# Patient Record
Sex: Female | Born: 1935 | Race: White | Hispanic: No | Smoking: Former smoker
Health system: Southern US, Community
[De-identification: ages and names within clinical notes are randomized; demographics above are authoritative.]

## PROBLEM LIST (undated history)

## (undated) DIAGNOSIS — D497 Neoplasm of unspecified behavior of endocrine glands and other parts of nervous system: Secondary | ICD-10-CM

## (undated) DIAGNOSIS — D126 Benign neoplasm of colon, unspecified: Secondary | ICD-10-CM

## (undated) DIAGNOSIS — Z923 Personal history of irradiation: Secondary | ICD-10-CM

## (undated) DIAGNOSIS — L853 Xerosis cutis: Secondary | ICD-10-CM

## (undated) DIAGNOSIS — Z973 Presence of spectacles and contact lenses: Secondary | ICD-10-CM

## (undated) DIAGNOSIS — C7970 Secondary malignant neoplasm of unspecified adrenal gland: Secondary | ICD-10-CM

## (undated) DIAGNOSIS — T7840XA Allergy, unspecified, initial encounter: Secondary | ICD-10-CM

## (undated) DIAGNOSIS — Z8719 Personal history of other diseases of the digestive system: Secondary | ICD-10-CM

## (undated) DIAGNOSIS — K5900 Constipation, unspecified: Secondary | ICD-10-CM

## (undated) DIAGNOSIS — R350 Frequency of micturition: Secondary | ICD-10-CM

## (undated) DIAGNOSIS — M1990 Unspecified osteoarthritis, unspecified site: Secondary | ICD-10-CM

## (undated) DIAGNOSIS — K219 Gastro-esophageal reflux disease without esophagitis: Secondary | ICD-10-CM

## (undated) DIAGNOSIS — D132 Benign neoplasm of duodenum: Secondary | ICD-10-CM

## (undated) DIAGNOSIS — K228 Other specified diseases of esophagus: Secondary | ICD-10-CM

## (undated) DIAGNOSIS — K2281 Esophageal polyp: Secondary | ICD-10-CM

## (undated) DIAGNOSIS — H9190 Unspecified hearing loss, unspecified ear: Secondary | ICD-10-CM

## (undated) HISTORY — PX: EYE SURGERY: SHX253

## (undated) HISTORY — PX: COLONOSCOPY: SHX174

## (undated) HISTORY — DX: Benign neoplasm of duodenum: D13.2

## (undated) HISTORY — PX: ESOPHAGOGASTRODUODENOSCOPY: SHX1529

## (undated) HISTORY — PX: TUBAL LIGATION: SHX77

## (undated) HISTORY — DX: Neoplasm of unspecified behavior of endocrine glands and other parts of nervous system: D49.7

## (undated) HISTORY — PX: ADRENAL GLAND SURGERY: SHX544

## (undated) HISTORY — DX: Benign neoplasm of colon, unspecified: D12.6

## (undated) HISTORY — DX: Allergy, unspecified, initial encounter: T78.40XA

## (undated) HISTORY — PX: CATARACT EXTRACTION, BILATERAL: SHX1313

---

## 1998-09-05 ENCOUNTER — Other Ambulatory Visit: Admission: RE | Admit: 1998-09-05 | Discharge: 1998-09-05 | Payer: Self-pay | Admitting: Family Medicine

## 1998-09-11 ENCOUNTER — Ambulatory Visit (HOSPITAL_COMMUNITY): Admission: RE | Admit: 1998-09-11 | Discharge: 1998-09-11 | Payer: Self-pay | Admitting: Family Medicine

## 1999-09-06 ENCOUNTER — Other Ambulatory Visit: Admission: RE | Admit: 1999-09-06 | Discharge: 1999-09-06 | Payer: Self-pay | Admitting: Family Medicine

## 2002-12-17 ENCOUNTER — Other Ambulatory Visit: Admission: RE | Admit: 2002-12-17 | Discharge: 2002-12-17 | Payer: Self-pay | Admitting: Gastroenterology

## 2003-12-29 ENCOUNTER — Other Ambulatory Visit: Admission: RE | Admit: 2003-12-29 | Discharge: 2003-12-29 | Payer: Self-pay | Admitting: Family Medicine

## 2006-01-27 ENCOUNTER — Emergency Department (HOSPITAL_COMMUNITY): Admission: EM | Admit: 2006-01-27 | Discharge: 2006-01-28 | Payer: Self-pay | Admitting: Emergency Medicine

## 2008-09-19 ENCOUNTER — Encounter (INDEPENDENT_AMBULATORY_CARE_PROVIDER_SITE_OTHER): Payer: Self-pay | Admitting: *Deleted

## 2008-09-23 ENCOUNTER — Encounter (INDEPENDENT_AMBULATORY_CARE_PROVIDER_SITE_OTHER): Payer: Self-pay | Admitting: *Deleted

## 2008-09-28 ENCOUNTER — Ambulatory Visit: Payer: Self-pay | Admitting: Internal Medicine

## 2008-10-04 ENCOUNTER — Ambulatory Visit: Payer: Self-pay | Admitting: Internal Medicine

## 2008-10-04 ENCOUNTER — Encounter: Payer: Self-pay | Admitting: Internal Medicine

## 2008-10-07 ENCOUNTER — Encounter: Payer: Self-pay | Admitting: Internal Medicine

## 2009-05-28 ENCOUNTER — Emergency Department (HOSPITAL_COMMUNITY): Admission: EM | Admit: 2009-05-28 | Discharge: 2009-05-28 | Payer: Self-pay | Admitting: Emergency Medicine

## 2009-06-08 ENCOUNTER — Ambulatory Visit (HOSPITAL_COMMUNITY): Admission: RE | Admit: 2009-06-08 | Discharge: 2009-06-08 | Payer: Self-pay | Admitting: Family Medicine

## 2009-06-17 DIAGNOSIS — D497 Neoplasm of unspecified behavior of endocrine glands and other parts of nervous system: Secondary | ICD-10-CM

## 2009-06-17 HISTORY — DX: Neoplasm of unspecified behavior of endocrine glands and other parts of nervous system: D49.7

## 2009-07-06 ENCOUNTER — Ambulatory Visit (HOSPITAL_COMMUNITY): Admission: RE | Admit: 2009-07-06 | Discharge: 2009-07-06 | Payer: Self-pay | Admitting: Family Medicine

## 2009-07-18 ENCOUNTER — Ambulatory Visit: Payer: Self-pay | Admitting: Thoracic Surgery

## 2009-07-20 ENCOUNTER — Ambulatory Visit: Admission: RE | Admit: 2009-07-20 | Discharge: 2009-07-20 | Payer: Self-pay | Admitting: Thoracic Surgery

## 2009-07-26 ENCOUNTER — Ambulatory Visit (HOSPITAL_COMMUNITY): Admission: RE | Admit: 2009-07-26 | Discharge: 2009-07-26 | Payer: Self-pay | Admitting: Thoracic Surgery

## 2009-08-01 ENCOUNTER — Ambulatory Visit: Payer: Self-pay | Admitting: Thoracic Surgery

## 2009-08-15 ENCOUNTER — Encounter: Payer: Self-pay | Admitting: Thoracic Surgery

## 2009-08-15 ENCOUNTER — Ambulatory Visit: Payer: Self-pay | Admitting: Thoracic Surgery

## 2009-08-15 ENCOUNTER — Inpatient Hospital Stay (HOSPITAL_COMMUNITY): Admission: RE | Admit: 2009-08-15 | Discharge: 2009-08-19 | Payer: Self-pay | Admitting: Thoracic Surgery

## 2009-08-15 HISTORY — PX: LUNG CANCER SURGERY: SHX702

## 2009-08-23 ENCOUNTER — Ambulatory Visit: Payer: Self-pay | Admitting: Thoracic Surgery

## 2009-08-23 ENCOUNTER — Encounter: Admission: RE | Admit: 2009-08-23 | Discharge: 2009-08-23 | Payer: Self-pay | Admitting: Thoracic Surgery

## 2009-09-13 ENCOUNTER — Ambulatory Visit: Payer: Self-pay | Admitting: Thoracic Surgery

## 2009-09-13 ENCOUNTER — Encounter: Admission: RE | Admit: 2009-09-13 | Discharge: 2009-09-13 | Payer: Self-pay | Admitting: Thoracic Surgery

## 2009-10-09 ENCOUNTER — Encounter (INDEPENDENT_AMBULATORY_CARE_PROVIDER_SITE_OTHER): Payer: Self-pay | Admitting: Surgery

## 2009-10-09 ENCOUNTER — Inpatient Hospital Stay (HOSPITAL_COMMUNITY): Admission: RE | Admit: 2009-10-09 | Discharge: 2009-10-12 | Payer: Self-pay | Admitting: Surgery

## 2009-10-23 ENCOUNTER — Ambulatory Visit: Payer: Self-pay | Admitting: Oncology

## 2009-10-27 ENCOUNTER — Encounter: Admission: RE | Admit: 2009-10-27 | Discharge: 2009-10-27 | Payer: Self-pay | Admitting: Thoracic Surgery

## 2009-10-27 ENCOUNTER — Ambulatory Visit: Payer: Self-pay | Admitting: Thoracic Surgery

## 2009-11-02 ENCOUNTER — Encounter: Payer: Self-pay | Admitting: Internal Medicine

## 2009-11-14 ENCOUNTER — Encounter: Payer: Self-pay | Admitting: Internal Medicine

## 2009-11-17 ENCOUNTER — Ambulatory Visit: Admission: RE | Admit: 2009-11-17 | Discharge: 2010-02-08 | Payer: Self-pay | Admitting: Radiation Oncology

## 2009-11-20 ENCOUNTER — Encounter: Payer: Self-pay | Admitting: Internal Medicine

## 2009-12-26 ENCOUNTER — Encounter: Admission: RE | Admit: 2009-12-26 | Discharge: 2009-12-26 | Payer: Self-pay | Admitting: Thoracic Surgery

## 2009-12-26 ENCOUNTER — Ambulatory Visit: Payer: Self-pay | Admitting: Thoracic Surgery

## 2010-02-08 ENCOUNTER — Ambulatory Visit: Payer: Self-pay | Admitting: Oncology

## 2010-02-12 ENCOUNTER — Encounter: Payer: Self-pay | Admitting: Internal Medicine

## 2010-04-03 ENCOUNTER — Ambulatory Visit: Payer: Self-pay | Admitting: Thoracic Surgery

## 2010-04-03 ENCOUNTER — Encounter: Admission: RE | Admit: 2010-04-03 | Discharge: 2010-04-03 | Payer: Self-pay | Admitting: Thoracic Surgery

## 2010-07-08 ENCOUNTER — Encounter: Payer: Self-pay | Admitting: Family Medicine

## 2010-07-19 NOTE — Letter (Signed)
Summary: [facility]  [facility]   Imported By: Sherian Rein 03/05/2010 11:51:48  _____________________________________________________________________  External Attachment:    Type:   Image     Comment:   External Document

## 2010-07-19 NOTE — Letter (Signed)
[facility]   Imported By: Sherian Rein 01/12/2010 11:15:33  _____________________________________________________________________  External Attachment:    Type:   Image     Comment:   External Document

## 2010-07-19 NOTE — Letter (Signed)
[facility]   Imported By: Sherian Rein 12/05/2009 09:36:11  _____________________________________________________________________  External Attachment:    Type:   Image     Comment:   External Document

## 2010-07-20 NOTE — Letter (Signed)
[facility]   Imported By: Lester [place] 01/04/2010 09:18:06  _____________________________________________________________________  External Attachment:    Type:   Image     Comment:   External Document

## 2010-08-16 ENCOUNTER — Encounter (HOSPITAL_BASED_OUTPATIENT_CLINIC_OR_DEPARTMENT_OTHER): Payer: Medicare Other | Admitting: Oncology

## 2010-08-16 DIAGNOSIS — C7490 Malignant neoplasm of unspecified part of unspecified adrenal gland: Secondary | ICD-10-CM

## 2010-08-16 DIAGNOSIS — C3410 Malignant neoplasm of upper lobe, unspecified bronchus or lung: Secondary | ICD-10-CM

## 2010-09-04 LAB — CBC
HCT: 30.2 % — ABNORMAL LOW (ref 36.0–46.0)
HCT: 31.6 % — ABNORMAL LOW (ref 36.0–46.0)
Hemoglobin: 10.4 g/dL — ABNORMAL LOW (ref 12.0–15.0)
Hemoglobin: 12.4 g/dL (ref 12.0–15.0)
MCHC: 33.2 g/dL (ref 30.0–36.0)
MCV: 90.9 fL (ref 78.0–100.0)
MCV: 91.2 fL (ref 78.0–100.0)
MCV: 91.3 fL (ref 78.0–100.0)
Platelets: 224 10*3/uL (ref 150–400)
RBC: 4.1 MIL/uL (ref 3.87–5.11)
RDW: 14.6 % (ref 11.5–15.5)
RDW: 14.7 % (ref 11.5–15.5)
WBC: 6.9 10*3/uL (ref 4.0–10.5)

## 2010-09-04 LAB — GLUCOSE, CAPILLARY: Glucose-Capillary: 130 mg/dL — ABNORMAL HIGH (ref 70–99)

## 2010-09-04 LAB — BASIC METABOLIC PANEL
BUN: 11 mg/dL (ref 6–23)
GFR calc non Af Amer: >60 mL/min (ref >60)
Glucose, Bld: 144 mg/dL — ABNORMAL HIGH (ref 70–99)
Potassium: 4.2 mEq/L (ref 3.5–5.1)

## 2010-09-04 LAB — CROSSMATCH

## 2010-09-04 LAB — COMPREHENSIVE METABOLIC PANEL
ALT: 26 U/L (ref 0–35)
AST: 31 U/L (ref 0–37)
CO2: 27 mEq/L (ref 19–32)
Calcium: 9.1 mg/dL (ref 8.4–10.5)
Chloride: 109 mEq/L (ref 96–112)
Creatinine, Ser: 0.61 mg/dL (ref 0.4–1.2)
GFR calc Af Amer: >60 mL/min (ref >60)
GFR calc non Af Amer: >60 mL/min (ref >60)
Glucose, Bld: 120 mg/dL — ABNORMAL HIGH (ref 70–99)
Total Bilirubin: 0.4 mg/dL (ref 0.3–1.2)

## 2010-09-04 LAB — URINALYSIS, ROUTINE W REFLEX MICROSCOPIC
Glucose, UA: NEGATIVE mg/dL
Hgb urine dipstick: NEGATIVE
Ketones, ur: NEGATIVE mg/dL
Protein, ur: NEGATIVE mg/dL
Urobilinogen, UA: 1 mg/dL (ref 0.0–1.0)

## 2010-09-04 LAB — POCT I-STAT 4, (NA,K, GLUC, HGB,HCT)
Glucose, Bld: 119 mg/dL — ABNORMAL HIGH (ref 70–99)
HCT: 28 % — ABNORMAL LOW (ref 36.0–46.0)
Hemoglobin: 9.5 g/dL — ABNORMAL LOW (ref 12.0–15.0)

## 2010-09-04 LAB — DIFFERENTIAL
Basophils Absolute: 0 10*3/uL (ref 0.0–0.1)
Eosinophils Absolute: 0.1 10*3/uL (ref 0.0–0.7)
Eosinophils Relative: 2 % (ref 0–5)
Lymphocytes Relative: 27 % (ref 12–46)
Neutrophils Relative %: 62 % (ref 43–77)

## 2010-09-04 LAB — PROTIME-INR
INR: 0.99 (ref 0.00–1.49)
Prothrombin Time: 13 seconds (ref 11.6–15.2)

## 2010-09-04 LAB — MRSA PCR SCREENING: MRSA by PCR: NEGATIVE

## 2010-09-05 ENCOUNTER — Other Ambulatory Visit: Payer: Self-pay | Admitting: Thoracic Surgery

## 2010-09-05 DIAGNOSIS — E278 Other specified disorders of adrenal gland: Secondary | ICD-10-CM

## 2010-09-05 DIAGNOSIS — C3490 Malignant neoplasm of unspecified part of unspecified bronchus or lung: Secondary | ICD-10-CM

## 2010-09-05 LAB — PROTIME-INR
INR: 0.99 (ref 0.00–1.49)
Prothrombin Time: 13 seconds (ref 11.6–15.2)

## 2010-09-05 LAB — CBC
MCHC: 34 g/dL (ref 30.0–36.0)
RBC: 4.55 MIL/uL (ref 3.87–5.11)
RDW: 15.3 % (ref 11.5–15.5)

## 2010-09-06 LAB — BLOOD GAS, ARTERIAL
Acid-Base Excess: 2 mmol/L (ref 0.0–2.0)
O2 Saturation: 97.3 %
Patient temperature: 98.6
TCO2: 26.7 mmol/L (ref 0–100)

## 2010-09-06 LAB — URINALYSIS, ROUTINE W REFLEX MICROSCOPIC
Ketones, ur: NEGATIVE mg/dL
Nitrite: NEGATIVE
Urobilinogen, UA: 1 mg/dL (ref 0.0–1.0)
pH: 8.5 — ABNORMAL HIGH (ref 5.0–8.0)

## 2010-09-06 LAB — TYPE AND SCREEN

## 2010-09-06 LAB — COMPREHENSIVE METABOLIC PANEL
AST: 22 U/L (ref 0–37)
Albumin: 3.4 g/dL — ABNORMAL LOW (ref 3.5–5.2)
CO2: 23 mEq/L (ref 19–32)
Calcium: 9.4 mg/dL (ref 8.4–10.5)
Creatinine, Ser: 0.52 mg/dL (ref 0.4–1.2)
GFR calc Af Amer: >60 mL/min (ref >60)
GFR calc non Af Amer: >60 mL/min (ref >60)

## 2010-09-06 LAB — CBC
MCHC: 33.8 g/dL (ref 30.0–36.0)
MCV: 92.6 fL (ref 78.0–100.0)
Platelets: 399 10*3/uL (ref 150–400)

## 2010-09-06 LAB — APTT: aPTT: 31 seconds (ref 24–37)

## 2010-09-06 LAB — PROTIME-INR: INR: 1 (ref 0.00–1.49)

## 2010-09-10 LAB — COMPREHENSIVE METABOLIC PANEL
ALT: 27 U/L (ref 0–35)
AST: 32 U/L (ref 0–37)
CO2: 28 mEq/L (ref 19–32)
Calcium: 8.5 mg/dL (ref 8.4–10.5)
GFR calc Af Amer: >60 mL/min (ref >60)
Potassium: 3.7 mEq/L (ref 3.5–5.1)
Sodium: 142 mEq/L (ref 135–145)
Total Protein: 6.6 g/dL (ref 6.0–8.3)

## 2010-09-10 LAB — BASIC METABOLIC PANEL
BUN: 10 mg/dL (ref 6–23)
BUN: 13 mg/dL (ref 6–23)
CO2: 26 mEq/L (ref 19–32)
CO2: 28 mEq/L (ref 19–32)
Chloride: 100 mEq/L (ref 96–112)
Chloride: 105 mEq/L (ref 96–112)
Creatinine, Ser: 0.56 mg/dL (ref 0.4–1.2)
GFR calc Af Amer: >60 mL/min (ref >60)
Glucose, Bld: 119 mg/dL — ABNORMAL HIGH (ref 70–99)
Glucose, Bld: 185 mg/dL — ABNORMAL HIGH (ref 70–99)
Potassium: 3.7 mEq/L (ref 3.5–5.1)

## 2010-09-10 LAB — CBC
HCT: 35.2 % — ABNORMAL LOW (ref 36.0–46.0)
MCHC: 33.7 g/dL (ref 30.0–36.0)
MCHC: 33.7 g/dL (ref 30.0–36.0)
MCHC: 33.8 g/dL (ref 30.0–36.0)
MCV: 93.4 fL (ref 78.0–100.0)
MCV: 93.8 fL (ref 78.0–100.0)
Platelets: 325 10*3/uL (ref 150–400)
RBC: 3.67 MIL/uL — ABNORMAL LOW (ref 3.87–5.11)
RBC: 3.68 MIL/uL — ABNORMAL LOW (ref 3.87–5.11)
RDW: 14.6 % (ref 11.5–15.5)
RDW: 14.6 % (ref 11.5–15.5)
RDW: 15.1 % (ref 11.5–15.5)

## 2010-09-10 LAB — POCT I-STAT 3, ART BLOOD GAS (G3+)
TCO2: 25 mmol/L (ref 0–100)
pH, Arterial: 7.441 — ABNORMAL HIGH (ref 7.350–7.400)

## 2010-09-17 LAB — APTT: aPTT: 28 seconds (ref 24–37)

## 2010-09-17 LAB — PROTIME-INR: INR: 1 (ref 0.00–1.49)

## 2010-09-17 LAB — CBC
HCT: 44.2 % (ref 36.0–46.0)
Platelets: 316 10*3/uL (ref 150–400)
RDW: 14.5 % (ref 11.5–15.5)

## 2010-09-18 LAB — BASIC METABOLIC PANEL
BUN: 10 mg/dL (ref 6–23)
GFR calc non Af Amer: >60 mL/min (ref >60)
Glucose, Bld: 128 mg/dL — ABNORMAL HIGH (ref 70–99)
Potassium: 3.5 mEq/L (ref 3.5–5.1)

## 2010-09-18 LAB — CBC
HCT: 41.1 % (ref 36.0–46.0)
MCHC: 33.5 g/dL (ref 30.0–36.0)
MCV: 92.2 fL (ref 78.0–100.0)
Platelets: 317 10*3/uL (ref 150–400)
RDW: 15.1 % (ref 11.5–15.5)

## 2010-09-18 LAB — DIFFERENTIAL
Basophils Absolute: 0 10*3/uL (ref 0.0–0.1)
Basophils Relative: 0 % (ref 0–1)
Eosinophils Absolute: 0.1 10*3/uL (ref 0.0–0.7)
Eosinophils Relative: 1 % (ref 0–5)
Lymphocytes Relative: 19 % (ref 12–46)

## 2010-09-18 LAB — URINALYSIS, ROUTINE W REFLEX MICROSCOPIC
Bilirubin Urine: NEGATIVE
Ketones, ur: NEGATIVE mg/dL
Nitrite: NEGATIVE
pH: 5.5 (ref 5.0–8.0)

## 2010-09-18 LAB — POCT CARDIAC MARKERS: Troponin i, poc: <0.05 ng/mL (ref 0.00–0.09)

## 2010-10-02 ENCOUNTER — Ambulatory Visit
Admission: RE | Admit: 2010-10-02 | Discharge: 2010-10-02 | Disposition: A | Discharge status: Home / Self Care | Payer: Medicare Other | Source: Ambulatory Visit | Attending: Thoracic Surgery | Admitting: Thoracic Surgery

## 2010-10-02 ENCOUNTER — Other Ambulatory Visit: Payer: Medicare Other

## 2010-10-02 ENCOUNTER — Ambulatory Visit (INDEPENDENT_AMBULATORY_CARE_PROVIDER_SITE_OTHER): Payer: Medicare Other | Admitting: Thoracic Surgery

## 2010-10-02 DIAGNOSIS — E278 Other specified disorders of adrenal gland: Secondary | ICD-10-CM

## 2010-10-02 DIAGNOSIS — C3490 Malignant neoplasm of unspecified part of unspecified bronchus or lung: Secondary | ICD-10-CM

## 2010-10-02 MED ORDER — IOHEXOL 300 MG/ML  SOLN
100.0000 mL | Freq: Once | INTRAMUSCULAR | Status: AC | PRN
  Administered 2010-10-02: 100 mL via INTRAVENOUS

## 2010-10-03 ENCOUNTER — Ambulatory Visit: Payer: Medicare Other | Admitting: Thoracic Surgery

## 2010-10-03 ENCOUNTER — Other Ambulatory Visit: Payer: Medicare Other

## 2010-10-03 NOTE — Assessment & Plan Note (Signed)
OFFICE VISIT  Leslie Petersen, Leslie Petersen DOB:  1934-07-07                                        October 02, 2010 CHART #:  [id number]  The patient returns today.  Her blood pressure is 153/82, pulse 84, respirations 18, sats 98%.  She is doing well.  CT scan of her chest and abdomen, we do not have the final report.  Her primary reports looks like there is no evidence of recurrence.  We will inform her when we get the final report.  I will let her be followed from now on by Dr. Truett Perna.  [name], M.D. Electronically Signed  DPB/MEDQ  D:  10/02/2010  T:  10/03/2010  Job:  [id number]  cc:   Quenton Fetter, M.D. Velora Heckler, MD

## 2010-10-30 NOTE — Assessment & Plan Note (Signed)
OFFICE VISIT   DAILA, ELBERT  DOB:  09/01/1934                                        August 23, 2009  CHART #:  [id number]   HISTORY:  The patient comes in today for 1-week postoperative followup.  She is status post a left upper lobe wedge resection on August 15, 2009.  Her postoperative course was uneventful, and she was able to be  discharged home on August 19, 2009, in good condition.  Post discharge,  she has done very well.  She continues to take the pain medication  sporadically, but overall her discomfort is improving.  She is getting  back to her regular activities and her breathing has been stable.  She  has no specific complaints today.   PHYSICAL EXAMINATION:  Vital Signs:  Blood pressure is 131/73, pulse is  150, respirations 16, O2 sat 96% on room air.  Her left mini thoracotomy  incision and chest tube sites have all healed well.  Her chest tube  sutures are removed.  Heart:  Regular rate and rhythm.  Lungs:  Clear.   The chest x-ray is stable with no pneumothorax.   ASSESSMENT AND PLAN:  The patient is doing very well status post left  upper lobe wedge.  Dr. Edwyna Shell has given her a refill of Percocet at  today's visit.  We will see her back in 3 weeks for followup with a  chest x-ray.   [name], M.D.  Electronically Signed   GC/MEDQ  D:  08/23/2009  T:  08/24/2009  Job:  [id number]   cc:   Sigmund Hazel, M.D.  Velora Heckler, MD

## 2010-10-30 NOTE — Letter (Signed)
July 18, 2009   Sigmund Hazel, MD  [address] [place], [place] [address]   Re:  STACYE, NOORI                  DOB:  02/04/1935   Dear Dr. Hyacinth Meeker:   I appreciate the opportunity of seeing the patient.  This patient,  approximately 15 years, had a left adrenalectomy for some type of  adrenal tumor done by Dr. Jamey Ripa.  She now comes back with a left upper  lobe nodule and on PET scan, this was positive with a standard uptake  value of 9.  She also had a large right adrenal mass that had a standard  uptake value of 7.7.  A needle biopsy was done of this adrenal mass,  which showed a lot of necrosis, but it was consistent with a cortical  tumor that could possibly be a malignant mass because of the cytological  atypia.  She has had no fever, chills, or weight loss.   MEDICATIONS:  Fosamax 70 mg a day, vitamin D, calcium, and eye drops.   ALLERGIES:  She has had no allergies.   FAMILY HISTORY:  Noncontributory.   SOCIAL HISTORY:  She is retired, has 1 child.  Quit smoking in 1990.  Does not drink alcohol on a regular basis.   REVIEW OF SYSTEMS:  VITAL SIGNS:  She is 135 pounds.  She is 5 feet 5  inches.  GENERAL:  Her weight has been stable.  CARDIAC:  No angina or atrial fibrillation.  PULMONARY:  No hemoptysis, asthma, or wheezing.  GI:  No nausea, vomiting, constipation, or diarrhea.  GU:  No kidney disease or dysuria.  VASCULAR:  No claudication, DVT, or TIAs.  NEUROLOGICAL:  No dizziness, headaches, blackouts, or seizures.  MUSCULOSKELETAL:  No arthritis or joint pain.  PSYCHIATRIC:  No depression or nervousness.  EYE/ENT:  No changes in eyesight or hearing.  HEMATOLOGICAL:  No problems with bleeding, clotting disorders, or  anemia.   PHYSICAL EXAMINATION:  General:  She is a well-developed Caucasian  female, in no acute distress.  Head, Eyes, Ears, Nose, and Throat:  Unremarkable.  Neck:  Supple without thyromegaly.  There is no  supraclavicular or  axillary adenopathy.  Chest:  Clear to auscultation  and percussion.  Heart:  Regular sinus rhythm.  No murmurs.  Abdomen:  Soft.  There is no hepatosplenomegaly.  There are surgical scars.  Extremities:  Pulses are 2+.  There is no clubbing or edema.  Neurological:  She is oriented x3.  Sensory and motor intact.  Cranial  nerves intact.   I think this is a very interesting situation.  I planned to get a needle  biopsy of the left lung mass to tell whether this is metastatic disease  or a primary lung cancer.  Then considering on what this shows, we will  make recommendations whether to remove the left lobe first or consider  adrenalectomy followed by the removal of the left lung.  I will discuss  this with Dr. Jamey Ripa regarding the timing of this.  I will also get a  full set of pulmonary function test.  I appreciate the opportunity of  seeing the patient.   Sincerely,   [name], M.D.  Electronically Signed   DPB/MEDQ  D:  07/18/2009  T:  07/19/2009  Job:  [id number]

## 2010-10-30 NOTE — Assessment & Plan Note (Signed)
OFFICE VISIT   Leslie Petersen, Leslie Petersen  DOB:  1935/02/25                                        Oct 27, 2009  CHART #:  [id number]   HISTORY OF PRESENT ILLNESS:  This is a 76 year old Caucasian female who  is status post open right adrenalectomy by Dr. Gerrit Friends on October 09, 2009.  The patient is known to Dr. Edwyna Shell from previous left mini thoracotomy  and left wedge resection of left upper lobe which was positive for  adenocarcinoma.  Surgery was performed on August 15, 2009.  The patient  was last seen in the office on September 13, 2009.  She was recovering well  from her left lung surgery.  She was recently discharged from [facility]  after undergoing her right open adrenalectomy.  She denies any shortness  of breath, chest pain, fever, or chills.   PHYSICAL EXAMINATION:  General:  This is a pleasant 76 year old  Caucasian female who is in no acute distress who is alert, oriented, and  cooperative.  Vital Signs:  BP 130/64, heart rate 118, respiration rate  20, O2 sat 97% on room air.  Cardiovascular:  Slightly tachycardic.  Pulmonary:  Clear to auscultation bilaterally.  No rales, wheeze, or  rhonchi.  Abdomen:  Soft, nontender.  Right adrenalectomy wound is  clean, dry, and continuing to heal.  Left posterior chest wound is well  healed.  No signs of infection.   DIAGNOSTIC TESTS:  Chest x-ray done today showed stable moderate hiatal  hernia, slight compression fracture of the thoracolumbar junction.  No  acute change or metastatic findings seen.   IMPRESSION AND PLAN:  1. Adenocarcinoma of the left upper lobe (status post wedge resection      of left upper lobe).  2. Right adrenal mass with atypia (status post open right      adrenalectomy).  3. The patient is going to return to see Dr. Edwyna Shell with a chest x-ray      in 2 months.  She is to      contact our office in the interim if there are any questions,      problems, or concerns.  She also has a followup  appointment to see      Dr. Gerrit Friends on December 01, 2009.   [name], M.D.  Electronically Signed   DZ/MEDQ  D:  10/27/2009  T:  10/28/2009  Job:  [id number]   cc:   Sigmund Hazel, M.D.  Velora Heckler, MD

## 2010-10-30 NOTE — Letter (Signed)
December 26, 2009   Leslie Heckler, MD  [phone]  [place], [place] [address]   Re:  [name], [name]                  DOB:  1934-08-24   Dear Tawanna Cooler,   I saw the patient back in the office today.  Her chest x-ray looks good  and she is doing well after both surgeries.  Her blood is 157/84, pulse  91, respirations 18, saturations  97%.  I will plan to see her back  again in 6 months with a chest x-ray.   [name], M.D.  Electronically Signed   DPB/MEDQ  D:  12/26/2009  T:  12/27/2009  Job:  [id number]

## 2010-10-30 NOTE — Letter (Signed)
April 03, 2010   G. Rolm Baptise, MD  [address]  [place], [place] [address]   Re:  Leslie Petersen, HULTS                  DOB:  1934/06/29   Dear [name],   I saw the patient back today.  We did a 41-month CT of the chest and  abdomen and although this is a preliminary report, there is no evidence  of any recurrence of her cancer either in her chest or in her abdomen.  She is doing well overall.  Her blood pressure is 159/89, pulse 98,  respirations 18, sats were 96%.  Plan to see her back again in 6 months  with another CT scan.   [name], M.D.  Electronically Signed   DPB/MEDQ  D:  04/03/2010  T:  04/04/2010  Job:  [id number]   cc:   Velora Heckler, MD

## 2010-10-30 NOTE — Letter (Signed)
September 13, 2009   Sigmund Hazel, MD  [address]  [address] [place], [place] [address]   Re:  Leslie Petersen, [name]                  DOB:  April 05, 1935   Dear Dr. Hyacinth Meeker:   I saw the patient back today after we had done a resection of her left  upper lobe mass.  We did a wedge resection and node resection, and she  is doing well postoperatively.  Her incision is well healed.  She has  had minimal pain.  Her blood pressure was 136/79, pulse 100,  respirations 18, and sats were 97%.  I will plan to send her to Dr. Darnell Level for scheduling her right adrenal surgery.   [name], M.D.  Electronically Signed   DPB/MEDQ  D:  09/13/2009  T:  09/14/2009  Job:  [id number]   cc:   Velora Heckler, MD

## 2010-10-30 NOTE — Letter (Signed)
August 02, 2009   Sigmund Hazel, MD  [address] [place], [place] [address]   Re:  EUSTOLIA, DRENNEN                  DOB:  1934/11/21   Dear Misty Stanley,   I saw the patient back today after a needle biopsy that was positive for  adenocarcinoma, so I feel she has 2 processes, an adenocarcinoma of the  left upper lobe and an adrenocortical tumor in right adrenals.  I  discussed this with Dr. Jamey Ripa and Dr. Gerrit Friends about how to proceed, and  although there is no right or wrong answer I though to proceed with a  VATS approach to remove her left upper lobe lesion with node dissection,  and we will plan to do this on August 15, 2009, at [facility].  We will  get her to see Dr. Gerrit Friends prior that as Dr. Jamey Ripa has referred her to  Dr. Gerrit Friends.  Her blood pressure was 130/78, pulse 100, respirations 18,  sats were 96%.  Pulmonary function tests were satisfactory with an FVC  of 3.32 and an FEV-1 of 2.3 and a diffusion capacity of 50%.  I  appreciate the opportunity of seeing the patient.   Sincerely,   [name], M.D.  Electronically Signed   DPB/MEDQ  D:  08/02/2009  T:  08/03/2009  Job:  [id number]   cc:   Currie Paris, M.D.  Velora Heckler, MD

## 2011-02-12 ENCOUNTER — Ambulatory Visit (HOSPITAL_COMMUNITY)
Admission: RE | Admit: 2011-02-12 | Discharge: 2011-02-12 | Disposition: A | Discharge status: Home / Self Care | Payer: Medicare Other | Source: Ambulatory Visit | Attending: Oncology | Admitting: Oncology

## 2011-02-12 ENCOUNTER — Other Ambulatory Visit: Payer: Self-pay | Admitting: Oncology

## 2011-02-12 ENCOUNTER — Encounter (HOSPITAL_BASED_OUTPATIENT_CLINIC_OR_DEPARTMENT_OTHER): Payer: Medicare Other | Admitting: Oncology

## 2011-02-12 DIAGNOSIS — C3410 Malignant neoplasm of upper lobe, unspecified bronchus or lung: Secondary | ICD-10-CM

## 2011-02-12 DIAGNOSIS — C7490 Malignant neoplasm of unspecified part of unspecified adrenal gland: Secondary | ICD-10-CM

## 2011-02-12 DIAGNOSIS — C3490 Malignant neoplasm of unspecified part of unspecified bronchus or lung: Secondary | ICD-10-CM

## 2011-08-03 IMAGING — CR DG CHEST 1V
1 series · 1 of 1 positions shown · non-contrast
Comparison: 05/28/2009

CLINICAL DATA: Post lung biopsy for left upper lobe nodule

CHEST - 1 VIEW

[view not recorded]
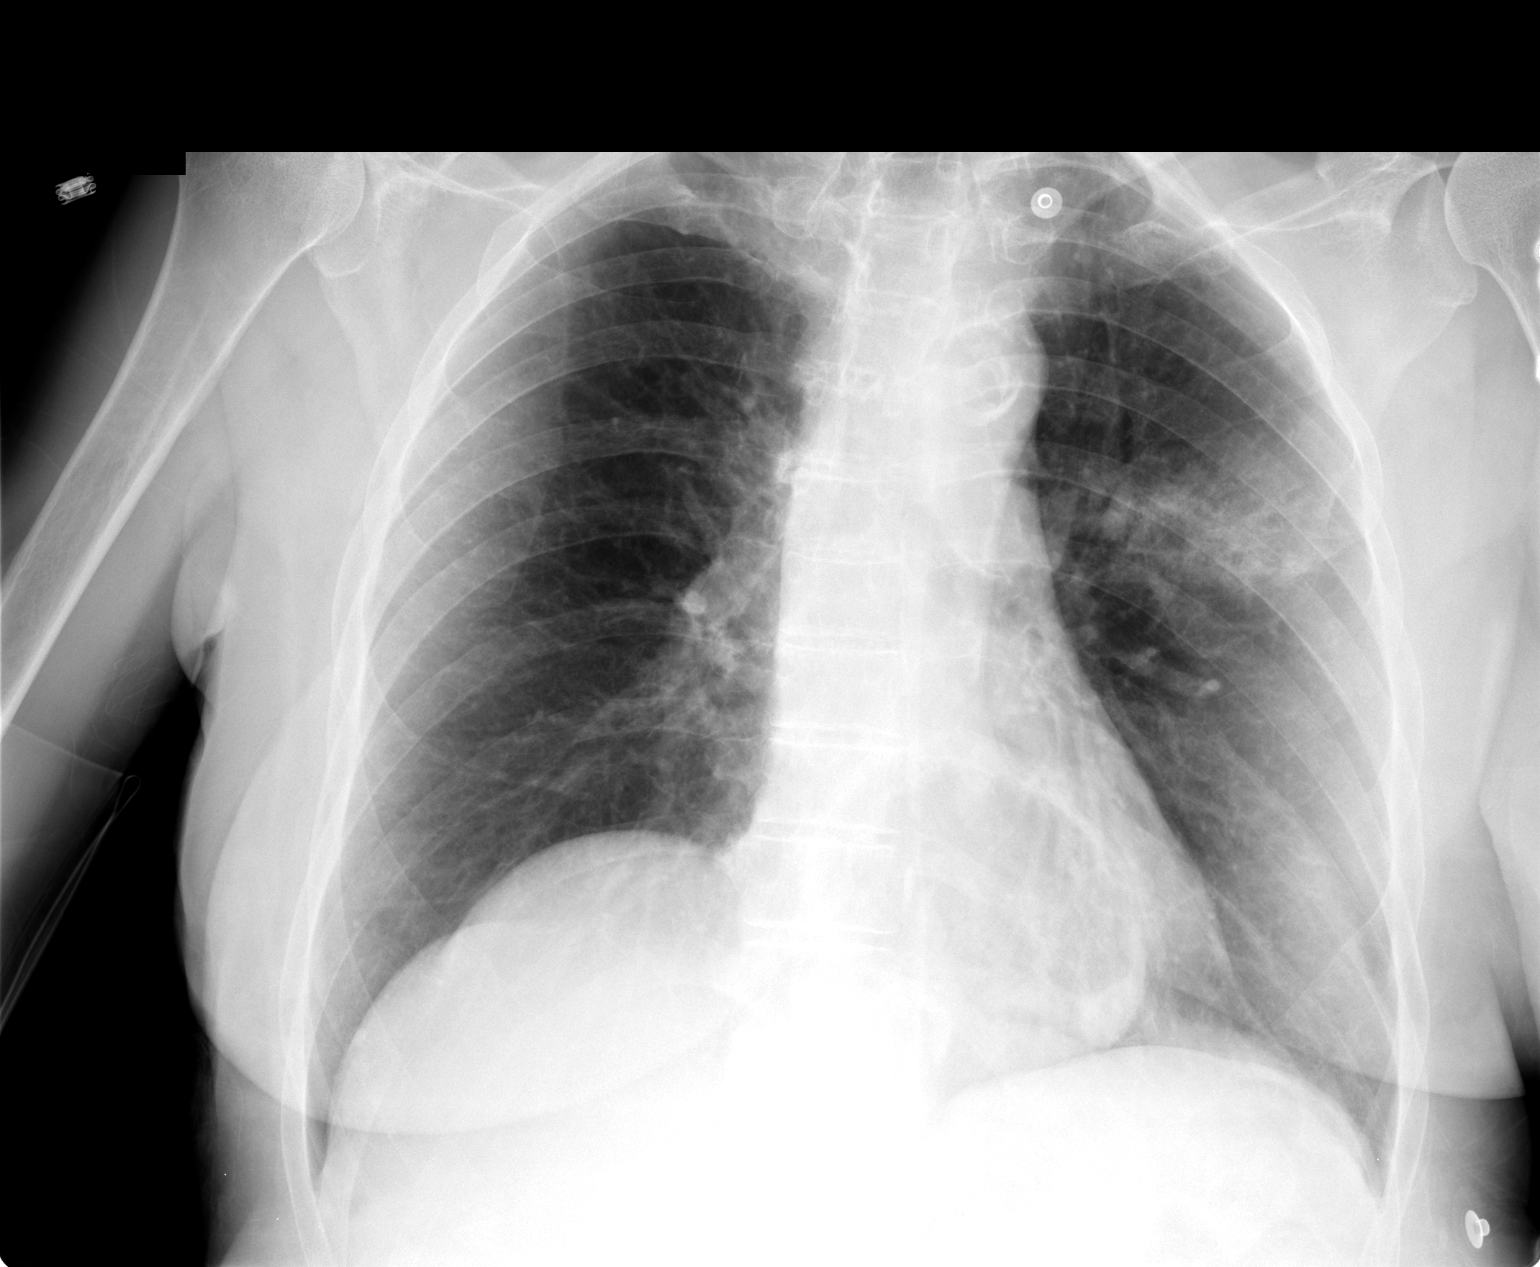

[1 of 1 positions shown; findings below may reference images not displayed]

FINDINGS: There is an airspace infiltrate at the biopsy site in the
left upper lobe, most likely related to post biopsy hemorrhage.  No
definite pneumothorax.  Lungs otherwise clear.  Heart normal.
Large hiatal hernia again noted.
IMPRESSION: There is some focal hemorrhage in the left upper lobe following the
lung biopsy, but no pneumothorax or other acute/significant
findings.

## 2011-09-02 ENCOUNTER — Encounter: Payer: Self-pay | Admitting: Internal Medicine

## 2011-09-05 ENCOUNTER — Encounter: Payer: Self-pay | Admitting: Internal Medicine

## 2011-10-10 ENCOUNTER — Ambulatory Visit (AMBULATORY_SURGERY_CENTER): Payer: Medicare Other | Admitting: *Deleted

## 2011-10-10 ENCOUNTER — Encounter: Payer: Self-pay | Admitting: Internal Medicine

## 2011-10-10 VITALS — Ht 65.5 in | Wt 130.0 lb

## 2011-10-10 DIAGNOSIS — Z1211 Encounter for screening for malignant neoplasm of colon: Secondary | ICD-10-CM

## 2011-10-10 MED ORDER — PEG-KCL-NACL-NASULF-NA ASC-C 100 G PO SOLR: ORAL | Status: DC

## 2011-10-24 ENCOUNTER — Ambulatory Visit (AMBULATORY_SURGERY_CENTER): Payer: Medicare Other | Admitting: Internal Medicine

## 2011-10-24 ENCOUNTER — Encounter: Payer: Self-pay | Admitting: Internal Medicine

## 2011-10-24 VITALS — BP 141/83 | HR 75 | Temp 96.7°F | Resp 20 | Ht 65.0 in | Wt 130.0 lb

## 2011-10-24 DIAGNOSIS — Z1211 Encounter for screening for malignant neoplasm of colon: Secondary | ICD-10-CM

## 2011-10-24 DIAGNOSIS — Z860100 Personal history of colon polyps, unspecified: Secondary | ICD-10-CM | POA: Insufficient documentation

## 2011-10-24 DIAGNOSIS — K5730 Diverticulosis of large intestine without perforation or abscess without bleeding: Secondary | ICD-10-CM

## 2011-10-24 MED ORDER — SODIUM CHLORIDE 0.9 % IV SOLN: 500.0000 mL | INTRAVENOUS | Status: DC

## 2011-10-24 NOTE — Op Note (Signed)
[facility] [address] [place], [place]  [address]  COLONOSCOPY PROCEDURE REPORT  PATIENT:  Leslie, Petersen  MR#:  [id number] BIRTHDATE:  1934/07/20, 76 yrs. old  GENDER:  female ENDOSCOPIST:  Iva Boop, MD, [facility]  PROCEDURE DATE:  10/24/2011 PROCEDURE:  Colonoscopy 29562 ASA CLASS:  Class III INDICATIONS:  surveillance and high-risk screening, history of pre-cancerous (adenomatous) colon polyps 2002 - diminutive adenoma(s) 2005 7 mm adenoma 2010 diminutive TV adenoma MEDICATIONS:   These medications were titrated to patient response per physician's verbal order, Fentanyl 25 mcg IV, Versed 3 mg IV  DESCRIPTION OF PROCEDURE:   After the risks benefits and alternatives of the procedure were thoroughly explained, informed consent was obtained.  Digital rectal exam was performed and revealed no abnormalities.   The LB CF-[id number] endoscope was introduced through the anus and advanced to the cecum, which was identified by both the appendix and ileocecal valve, without limitations.  The quality of the prep was excellent, using MoviPrep.  The instrument was then slowly withdrawn as the colon was fully examined. <<PROCEDUREIMAGES>>  FINDINGS:  Moderate diverticulosis was found throughout the colon. This was otherwise a normal examination of the colon. Includes right colon retroflexion.   Retroflexed views in the rectum revealed no abnormalities.    The time to cecum = 1:59 minutes. The scope was then withdrawn in 7:32 minutes from the cecum and the procedure completed. COMPLICATIONS:  None ENDOSCOPIC IMPRESSION: 1) Moderate diverticulosis throughout the colon 2) Otherwise normal examination 3) Personal history of adenomatous polyps  REPEAT EXAM:  In 5 year(s) for routine screening colonoscopy - if health status at age 64 permits  Iva Boop, MD, [facility]  CC:  The Patient and Sigmund Hazel, MD  n. Rosalie Doctor:   Iva Boop at 10/24/2011 09:00  AM  Simmie Davies, [phone]

## 2011-10-24 NOTE — Patient Instructions (Signed)
YOU HAD AN ENDOSCOPIC PROCEDURE TODAY AT THE [facility]: Refer to the procedure report that was given to you for any specific questions about what was found during the examination.  If the procedure report does not answer your questions, please call your gastroenterologist to clarify.  If you requested that your care partner not be given the details of your procedure findings, then the procedure report has been included in a sealed envelope for you to review at your convenience later.  YOU SHOULD EXPECT: Some feelings of bloating in the abdomen. Passage of more gas than usual.  Walking can help get rid of the air that was put into your GI tract during the procedure and reduce the bloating. If you had a lower endoscopy (such as a colonoscopy or flexible sigmoidoscopy) you may notice spotting of blood in your stool or on the toilet paper. If you underwent a bowel prep for your procedure, then you may not have a normal bowel movement for a few days.  DIET: Your first meal following the procedure should be a light meal and then it is ok to progress to your normal diet.  A half-sandwich or bowl of soup is an example of a good first meal.  Heavy or fried foods are harder to digest and may make you feel nauseous or bloated.  Likewise meals heavy in dairy and vegetables can cause extra gas to form and this can also increase the bloating.  Drink plenty of fluids but you should avoid alcoholic beverages for 24 hours.  ACTIVITY: Your care partner should take you home directly after the procedure.  You should plan to take it easy, moving slowly for the rest of the day.  You can resume normal activity the day after the procedure however you should NOT DRIVE or use heavy machinery for 24 hours (because of the sedation medicines used during the test).    SYMPTOMS TO REPORT IMMEDIATELY: A gastroenterologist can be reached at any hour.  During normal business hours, 8:30 AM to 5:00 PM Monday through Friday,  call [phone].  After hours and on weekends, please call the GI answering service at [phone] who will take a message and have the physician on call contact you.   Following lower endoscopy (colonoscopy or flexible sigmoidoscopy):  Excessive amounts of blood in the stool  Significant tenderness or worsening of abdominal pains  Swelling of the abdomen that is new, acute  Fever of 100F or higher  Following upper endoscopy (EGD)  Vomiting of blood or coffee ground material  New chest pain or pain under the shoulder blades  Painful or persistently difficult swallowing  New shortness of breath  Fever of 100F or higher  Black, tarry-looking stools  FOLLOW UP: If any biopsies were taken you will be contacted by phone or by letter within the next 1-3 weeks.  Call your gastroenterologist if you have not heard about the biopsies in 3 weeks.  Our staff will call the home number listed on your records the next business day following your procedure to check on you and address any questions or concerns that you may have at that time regarding the information given to you following your procedure. This is a courtesy call and so if there is no answer at the home number and we have not heard from you through the emergency physician on call, we will assume that you have returned to your regular daily activities without incident.  SIGNATURES/CONFIDENTIALITY: You and/or your care   partner have signed paperwork which will be entered into your electronic medical record.  These signatures attest to the fact that that the information above on your After Visit Summary has been reviewed and is understood.  Full responsibility of the confidentiality of this discharge information lies with you and/or your care-partner.   Diverticulosis Diverticulosis is a common condition that develops when small pouches (diverticula) form in the wall of the colon. The risk of diverticulosis increases with age. It happens  more often in people who eat a low-fiber diet. Most individuals with diverticulosis have no symptoms. Those individuals with symptoms usually experience abdominal pain, constipation, or loose stools (diarrhea). HOME CARE INSTRUCTIONS   Increase the amount of fiber in your diet as directed by your caregiver or dietician. This may reduce symptoms of diverticulosis.   Your caregiver may recommend taking a dietary fiber supplement.   Drink at least 6 to 8 glasses of water each day to prevent constipation.   Try not to strain when you have a bowel movement.   Your caregiver may recommend avoiding nuts and seeds to prevent complications, although this is still an uncertain benefit.   Only take over-the-counter or prescription medicines for pain, discomfort, or fever as directed by your caregiver.  FOODS WITH HIGH FIBER CONTENT INCLUDE:  Fruits. Apple, peach, pear, tangerine, raisins, prunes.   Vegetables. Brussels sprouts, asparagus, broccoli, cabbage, carrot, cauliflower, romaine lettuce, spinach, summer squash, tomato, winter squash, zucchini.   Starchy Vegetables. Baked beans, kidney beans, lima beans, split peas, lentils, potatoes (with skin).   Grains. Whole wheat bread, brown rice, bran flake cereal, plain oatmeal, white rice, shredded wheat, bran muffins.  SEEK IMMEDIATE MEDICAL CARE IF:   You develop increasing pain or severe bloating.   You have an oral temperature above 102 F (38.9 C), not controlled by medicine.   You develop vomiting or bowel movements that are bloody or black.  Document Released: 02/29/2004 Document Revised: 05/23/2011 Document Reviewed: 11/01/2009 [facility] Patient Information 2012 [place], [place].

## 2011-10-25 ENCOUNTER — Telehealth: Payer: Self-pay

## 2011-10-25 NOTE — Telephone Encounter (Signed)
  Follow up Call-  Call back number 10/24/2011  Post procedure Call Back phone  # [phone]  Permission to leave phone message Yes     Patient questions:  Do you have a fever, pain , or abdominal swelling? no Pain Score  0 *  Have you tolerated food without any problems? yes  Have you been able to return to your normal activities? yes  Do you have any questions about your discharge instructions: Diet   no Medications  no Follow up visit  no  Do you have questions or concerns about your Care? no  Actions: * If pain score is 4 or above: No action needed, pain <4.

## 2011-12-25 ENCOUNTER — Ambulatory Visit (HOSPITAL_COMMUNITY)
Admission: RE | Admit: 2011-12-25 | Discharge: 2011-12-25 | Disposition: A | Discharge status: Home / Self Care | Payer: Medicare Other | Source: Ambulatory Visit | Attending: Family Medicine | Admitting: Family Medicine

## 2011-12-25 ENCOUNTER — Other Ambulatory Visit: Payer: Self-pay | Admitting: Family Medicine

## 2011-12-25 ENCOUNTER — Ambulatory Visit (HOSPITAL_COMMUNITY): Admission: RE | Admit: 2011-12-25 | Payer: Medicare Other | Source: Ambulatory Visit

## 2011-12-25 DIAGNOSIS — H5702 Anisocoria: Secondary | ICD-10-CM | POA: Insufficient documentation

## 2011-12-25 DIAGNOSIS — Z9889 Other specified postprocedural states: Secondary | ICD-10-CM | POA: Insufficient documentation

## 2011-12-25 DIAGNOSIS — R42 Dizziness and giddiness: Secondary | ICD-10-CM | POA: Insufficient documentation

## 2011-12-25 DIAGNOSIS — Z85858 Personal history of malignant neoplasm of other endocrine glands: Secondary | ICD-10-CM | POA: Insufficient documentation

## 2011-12-25 DIAGNOSIS — R110 Nausea: Secondary | ICD-10-CM | POA: Insufficient documentation

## 2011-12-25 LAB — CREATININE, SERUM
Creatinine, Ser: 0.45 mg/dL — ABNORMAL LOW (ref 0.50–1.10)
GFR calc Af Amer: >90 mL/min (ref >90)

## 2011-12-25 MED ORDER — GADOBENATE DIMEGLUMINE 529 MG/ML IV SOLN
12.0000 mL | Freq: Once | INTRAVENOUS | Status: AC
  Administered 2011-12-25: 12 mL via INTRAVENOUS

## 2011-12-26 ENCOUNTER — Other Ambulatory Visit: Payer: Medicare Other

## 2012-01-10 ENCOUNTER — Telehealth: Payer: Self-pay | Admitting: Oncology

## 2012-01-10 NOTE — Telephone Encounter (Signed)
S/w pt re appt for 9/9.  °

## 2012-02-20 ENCOUNTER — Other Ambulatory Visit: Payer: Self-pay | Admitting: *Deleted

## 2012-02-20 ENCOUNTER — Telehealth: Payer: Self-pay | Admitting: Oncology

## 2012-02-20 NOTE — Telephone Encounter (Signed)
called pt per dh and move appt out for another pt,pt aware and new appt is 9/23   aom

## 2012-02-24 ENCOUNTER — Ambulatory Visit: Payer: Medicare Other | Admitting: Oncology

## 2012-03-09 ENCOUNTER — Ambulatory Visit (HOSPITAL_BASED_OUTPATIENT_CLINIC_OR_DEPARTMENT_OTHER): Payer: Medicare Other | Admitting: Oncology

## 2012-03-09 ENCOUNTER — Telehealth: Payer: Self-pay | Admitting: Oncology

## 2012-03-09 ENCOUNTER — Ambulatory Visit (HOSPITAL_COMMUNITY)
Admission: RE | Admit: 2012-03-09 | Discharge: 2012-03-09 | Disposition: A | Discharge status: Home / Self Care | Payer: Medicare Other | Source: Ambulatory Visit | Attending: Oncology | Admitting: Oncology

## 2012-03-09 VITALS — BP 144/75 | HR 79 | Temp 97.8°F | Resp 18 | Ht 65.0 in | Wt 124.6 lb

## 2012-03-09 DIAGNOSIS — Z85118 Personal history of other malignant neoplasm of bronchus and lung: Secondary | ICD-10-CM | POA: Insufficient documentation

## 2012-03-09 DIAGNOSIS — C7490 Malignant neoplasm of unspecified part of unspecified adrenal gland: Secondary | ICD-10-CM

## 2012-03-09 DIAGNOSIS — C3490 Malignant neoplasm of unspecified part of unspecified bronchus or lung: Secondary | ICD-10-CM

## 2012-03-09 DIAGNOSIS — C3410 Malignant neoplasm of upper lobe, unspecified bronchus or lung: Secondary | ICD-10-CM

## 2012-03-09 NOTE — Progress Notes (Signed)
   [facility]    OFFICE PROGRESS NOTE   INTERVAL HISTORY:   She returns as scheduled. No complaint. Good appetite and energy level. No dyspnea.  Objective:  Vital signs in last 24 hours:  Blood pressure 144/75, pulse 79, temperature 97.8 F (36.6 C), temperature source Oral, resp. rate 18, height 5\' 5"  (1.651 m), weight 124 lb 9.6 oz (56.518 kg).    HEENT: Neck without mass Lymphatics: No cervical, supraclavicular, axillary, or inguinal nodes Resp: Bronchial sounds at the left lower chest, no respiratory distress Cardio: Regular rate and rhythm GI: No hepatosplenomegaly, no mass Vascular: No leg edema    Medications: I have reviewed the patient's current medications.  Assessment/Plan: 1. 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 2. Left upper lung adenocarcinoma (T1 N0), status post a wedge resection, 08/15/2009. 3. Left adrenocortical carcinoma, status post a left adrenalectomy in 1994. 4. CT of the chest and abdomen, 04/03/2010.  No evidence of tumor recurrence or metastatic disease in the chest or abdomen.   Disposition:  She remains in clinical remission from lung cancer and adrenocortical carcinoma. She continues followup with Dr. Sharl Ma for management of hormone replacement. She will be referred for a surveillance chest x-ray today. Ms. Kilburg will return for an office visit in 6 months.   Thornton Papas, MD  03/09/2012  12:06 PM

## 2012-03-09 NOTE — Telephone Encounter (Signed)
appts made and printred for pt and pt sent to xray  aom

## 2012-04-08 ENCOUNTER — Encounter: Payer: Self-pay | Admitting: Dietician

## 2012-04-08 NOTE — Progress Notes (Signed)
Brief Out-patient Oncology Nutrition Note  Reason: Patient Screened Positive For Nutrition Risk For Unintentional Weight Loss and Decreased Appetite.   Leslie Petersen is a 77 year old female patient of Dr. Truett Perna, diagnosed with malignant neoplasm of brochus and lung.  Contacted patient via telephone for positive nutrition risk. She reported she does not know why a dietitian needs to call and reported that she is fine.    RD available for nutrition needs.   Iven Finn, MS, RD, LDN [phone]

## 2012-08-24 ENCOUNTER — Telehealth: Payer: Self-pay | Admitting: Oncology

## 2012-08-24 ENCOUNTER — Ambulatory Visit (HOSPITAL_BASED_OUTPATIENT_CLINIC_OR_DEPARTMENT_OTHER): Payer: Medicare Other | Admitting: Oncology

## 2012-08-24 VITALS — BP 155/82 | HR 78 | Temp 96.8°F | Resp 18 | Ht 65.0 in | Wt 122.0 lb

## 2012-08-24 DIAGNOSIS — R109 Unspecified abdominal pain: Secondary | ICD-10-CM

## 2012-08-24 DIAGNOSIS — C3490 Malignant neoplasm of unspecified part of unspecified bronchus or lung: Secondary | ICD-10-CM

## 2012-08-24 DIAGNOSIS — C7490 Malignant neoplasm of unspecified part of unspecified adrenal gland: Secondary | ICD-10-CM

## 2012-08-24 NOTE — Progress Notes (Signed)
   [facility]    OFFICE PROGRESS NOTE   INTERVAL HISTORY:   She returns as scheduled. Good appetite and energy level. She reports chronic back pain, especially with ambulation. She has noted intermittent discomfort in the right mid and lower abdomen for the past 3 weeks. The pain is present with certain movements. No consistent pain. No other complaint.  Objective:  Vital signs in last 24 hours:  Blood pressure 155/82, pulse 78, temperature 96.8 F (36 C), temperature source Oral, resp. rate 18, height 5\' 5"  (1.651 m), weight 122 lb (55.339 kg).    HEENT: Neck without mass Lymphatics: No cervical, supra-clavicular, axillary, or inguinal node Resp: Lungs clear bilaterally Cardio: Regular rate and rhythm GI: No hepatomegaly, nontender, no mass Vascular: No leg edema   Medications: I have reviewed the patient's current medications.  Assessment/Plan: 1. 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 2. Left upper lung adenocarcinoma (T1 N0), status post a wedge resection, 08/15/2009. 3. Left adrenocortical carcinoma, status post a left adrenalectomy in 1994. 4. CT of the chest and abdomen, 10/02/2010. No evidence of tumor recurrence or metastatic disease in the chest or abdomen. 5. Intermittent right abdominal pain for the past 3 weeks-likely related to a benign musculoskeletal condition  Disposition:  She remains in clinical remission from adrenocortical carcinoma. She continues followup with Dr. Sharl Ma for hormone replacement. Ms. Keeven requests a restaging CT evaluation. This will be scheduled for within the next few weeks. She will followup with Dr. Hyacinth Meeker for the chronic back pain. She will return for an office visit in 6 months.   Thornton Papas, MD  08/24/2012  6:04 PM

## 2012-08-26 ENCOUNTER — Other Ambulatory Visit: Payer: Self-pay | Admitting: Family Medicine

## 2012-08-26 DIAGNOSIS — M549 Dorsalgia, unspecified: Secondary | ICD-10-CM

## 2012-08-31 ENCOUNTER — Other Ambulatory Visit: Payer: Medicare Other

## 2012-08-31 ENCOUNTER — Other Ambulatory Visit: Payer: Medicare Other | Admitting: Lab

## 2012-09-01 ENCOUNTER — Ambulatory Visit (HOSPITAL_COMMUNITY)
Admission: RE | Admit: 2012-09-01 | Discharge: 2012-09-01 | Disposition: A | Discharge status: Home / Self Care | Payer: Medicare Other | Source: Ambulatory Visit | Attending: Oncology | Admitting: Oncology

## 2012-09-01 ENCOUNTER — Other Ambulatory Visit (HOSPITAL_BASED_OUTPATIENT_CLINIC_OR_DEPARTMENT_OTHER): Payer: Medicare Other | Admitting: Lab

## 2012-09-01 DIAGNOSIS — M799 Soft tissue disorder, unspecified: Secondary | ICD-10-CM | POA: Insufficient documentation

## 2012-09-01 DIAGNOSIS — K449 Diaphragmatic hernia without obstruction or gangrene: Secondary | ICD-10-CM | POA: Insufficient documentation

## 2012-09-01 DIAGNOSIS — C3490 Malignant neoplasm of unspecified part of unspecified bronchus or lung: Secondary | ICD-10-CM

## 2012-09-01 DIAGNOSIS — Z9089 Acquired absence of other organs: Secondary | ICD-10-CM | POA: Insufficient documentation

## 2012-09-01 DIAGNOSIS — C7490 Malignant neoplasm of unspecified part of unspecified adrenal gland: Secondary | ICD-10-CM

## 2012-09-01 DIAGNOSIS — K5730 Diverticulosis of large intestine without perforation or abscess without bleeding: Secondary | ICD-10-CM | POA: Insufficient documentation

## 2012-09-01 DIAGNOSIS — R911 Solitary pulmonary nodule: Secondary | ICD-10-CM | POA: Insufficient documentation

## 2012-09-01 DIAGNOSIS — N281 Cyst of kidney, acquired: Secondary | ICD-10-CM | POA: Insufficient documentation

## 2012-09-01 DIAGNOSIS — J438 Other emphysema: Secondary | ICD-10-CM | POA: Insufficient documentation

## 2012-09-01 LAB — BASIC METABOLIC PANEL (CC13)
Potassium: 4 mEq/L (ref 3.5–5.1)
Sodium: 142 mEq/L (ref 136–145)

## 2012-09-01 MED ORDER — IOHEXOL 300 MG/ML  SOLN
100.0000 mL | Freq: Once | INTRAMUSCULAR | Status: AC | PRN
  Administered 2012-09-01: 100 mL via INTRAVENOUS

## 2012-09-04 ENCOUNTER — Telehealth: Payer: Self-pay | Admitting: *Deleted

## 2012-09-04 NOTE — Telephone Encounter (Signed)
Message copied by Wandalee Ferdinand on Fri Sep 04, 2012 11:10 AM ------      Message from: Thornton Papas B      Created: Wed Sep 02, 2012  6:38 PM       Please call patient, no evidence of recurrent lung or adrenal cancer, small round soft tissue lesion near the spine is likely benign. Was present in 2011.      Ask radiology to change date of comparison CT on current report.            F/u as scheduled ------

## 2012-09-04 NOTE — Telephone Encounter (Signed)
Called patient with CT results.

## 2012-09-05 ENCOUNTER — Ambulatory Visit
Admission: RE | Admit: 2012-09-05 | Discharge: 2012-09-05 | Disposition: A | Discharge status: Home / Self Care | Payer: Medicare Other | Source: Ambulatory Visit | Attending: Family Medicine | Admitting: Family Medicine

## 2012-09-05 DIAGNOSIS — M549 Dorsalgia, unspecified: Secondary | ICD-10-CM

## 2013-03-02 ENCOUNTER — Ambulatory Visit (HOSPITAL_BASED_OUTPATIENT_CLINIC_OR_DEPARTMENT_OTHER): Payer: Medicare Other | Admitting: Oncology

## 2013-03-02 ENCOUNTER — Telehealth: Payer: Self-pay | Admitting: Oncology

## 2013-03-02 VITALS — BP 155/88 | HR 117 | Temp 98.2°F | Resp 20 | Ht 65.0 in | Wt 116.9 lb

## 2013-03-02 DIAGNOSIS — C7490 Malignant neoplasm of unspecified part of unspecified adrenal gland: Secondary | ICD-10-CM

## 2013-03-02 NOTE — Telephone Encounter (Signed)
, °

## 2013-03-02 NOTE — Progress Notes (Signed)
   [facility]    OFFICE PROGRESS NOTE   INTERVAL HISTORY:   She returns as scheduled. She feels well. Good appetite. No pain. No new complaint.  A restaging CT evaluation on 09/04/2012 revealed no evidence of recurrent lung cancer. No evidence of metastatic disease in the abdomen or pelvis. 16 mm rounded soft tissue density was noted Thomas culture posterior to the L5. The lesion was noted on a PET scan in January of 2011 and was not hypermetabolic.  Objective:  Vital signs in last 24 hours:  Blood pressure 155/88, pulse 117, temperature 98.2 F (36.8 C), temperature source Oral, resp. rate 20, height 5\' 5"  (1.651 m), weight 116 lb 14.4 oz (53.025 kg). repeat manual pulse 100.    HEENT: Neck without mass Lymphatics: No cervical, supra-clavicular, axillary, or inguinal nodes Resp: Lungs clear bilaterally Cardio: Regular rate and rhythm GI: No hepatomegaly, no mass, nontender Vascular: No leg edema Musculoskeletal: No palpable mass overlying the lower lumbar spine, sacrum, or in the para-spinal areas     Medications: I have reviewed the patient's current medications.  Assessment/Plan: 1. 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 2. Left upper lung adenocarcinoma (T1 N0), status post a wedge resection, 08/15/2009. 3. Left adrenocortical carcinoma, status post a left adrenalectomy in 1994. 4. CT of the chest and abdomen, 09/04/2012. No evidence of tumor recurrence or metastatic disease in the chest or abdomen. 5. Soft tissue nodule noted on the CT 09/04/2012, posterior to the left sacrum-present on a PET/CT 07/06/2009 without hypermetabolic activity, likely benign. No lesion palpable on exam today.  Disposition:  Leslie Petersen remains in clinical remission from lung cancer and adrenocortical carcinoma. She has lost weight (proximally 8 pounds) over the past year, but reports a good appetite. She will return for an office  visit in 6 months.   Thornton Papas, MD  03/02/2013  3:51 PM

## 2013-09-04 ENCOUNTER — Emergency Department (HOSPITAL_COMMUNITY)
Admission: EM | Admit: 2013-09-04 | Discharge: 2013-09-04 | Disposition: A | Discharge status: Home / Self Care | Payer: Medicare Other | Attending: Emergency Medicine | Admitting: Emergency Medicine

## 2013-09-04 ENCOUNTER — Encounter (HOSPITAL_COMMUNITY): Payer: Self-pay | Admitting: Emergency Medicine

## 2013-09-04 ENCOUNTER — Emergency Department (HOSPITAL_COMMUNITY): Payer: Medicare Other

## 2013-09-04 DIAGNOSIS — R109 Unspecified abdominal pain: Secondary | ICD-10-CM | POA: Insufficient documentation

## 2013-09-04 DIAGNOSIS — Z85118 Personal history of other malignant neoplasm of bronchus and lung: Secondary | ICD-10-CM | POA: Insufficient documentation

## 2013-09-04 DIAGNOSIS — Z8639 Personal history of other endocrine, nutritional and metabolic disease: Secondary | ICD-10-CM | POA: Insufficient documentation

## 2013-09-04 DIAGNOSIS — N390 Urinary tract infection, site not specified: Secondary | ICD-10-CM

## 2013-09-04 DIAGNOSIS — Z7982 Long term (current) use of aspirin: Secondary | ICD-10-CM | POA: Insufficient documentation

## 2013-09-04 DIAGNOSIS — H409 Unspecified glaucoma: Secondary | ICD-10-CM | POA: Insufficient documentation

## 2013-09-04 DIAGNOSIS — Z862 Personal history of diseases of the blood and blood-forming organs and certain disorders involving the immune mechanism: Secondary | ICD-10-CM | POA: Insufficient documentation

## 2013-09-04 DIAGNOSIS — Z8739 Personal history of other diseases of the musculoskeletal system and connective tissue: Secondary | ICD-10-CM | POA: Insufficient documentation

## 2013-09-04 DIAGNOSIS — Z860100 Personal history of colon polyps, unspecified: Secondary | ICD-10-CM | POA: Insufficient documentation

## 2013-09-04 DIAGNOSIS — IMO0002 Reserved for concepts with insufficient information to code with codable children: Secondary | ICD-10-CM | POA: Insufficient documentation

## 2013-09-04 DIAGNOSIS — Z87891 Personal history of nicotine dependence: Secondary | ICD-10-CM | POA: Insufficient documentation

## 2013-09-04 DIAGNOSIS — R300 Dysuria: Secondary | ICD-10-CM

## 2013-09-04 DIAGNOSIS — Z79899 Other long term (current) drug therapy: Secondary | ICD-10-CM | POA: Insufficient documentation

## 2013-09-04 LAB — CBC WITH DIFFERENTIAL/PLATELET
Basophils Absolute: 0.1 10*3/uL (ref 0.0–0.1)
Basophils Relative: 1 % (ref 0–1)
Eosinophils Absolute: 0.1 10*3/uL (ref 0.0–0.7)
Eosinophils Relative: 1 % (ref 0–5)
HCT: 39.1 % (ref 36.0–46.0)
Hemoglobin: 13 g/dL (ref 12.0–15.0)
Lymphocytes Relative: 21 % (ref 12–46)
Lymphs Abs: 1.3 10*3/uL (ref 0.7–4.0)
MCH: 32.9 pg (ref 26.0–34.0)
MCHC: 33.2 g/dL (ref 30.0–36.0)
MCV: 99 fL (ref 78.0–100.0)
Monocytes Absolute: 0.3 10*3/uL (ref 0.1–1.0)
Monocytes Relative: 6 % (ref 3–12)
Neutro Abs: 4.2 10*3/uL (ref 1.7–7.7)
Neutrophils Relative %: 71 % (ref 43–77)
Platelets: 270 10*3/uL (ref 150–400)
RBC: 3.95 MIL/uL (ref 3.87–5.11)
RDW: 14.2 % (ref 11.5–15.5)
WBC: 5.9 10*3/uL (ref 4.0–10.5)

## 2013-09-04 LAB — URINALYSIS, ROUTINE W REFLEX MICROSCOPIC
Bilirubin Urine: NEGATIVE
Glucose, UA: NEGATIVE mg/dL
Hgb urine dipstick: NEGATIVE
Ketones, ur: NEGATIVE mg/dL
Leukocytes, UA: NEGATIVE
Nitrite: POSITIVE — AB
Protein, ur: NEGATIVE mg/dL
Specific Gravity, Urine: 1.013 (ref 1.005–1.030)
Urobilinogen, UA: 1 mg/dL (ref 0.0–1.0)
pH: 7.5 (ref 5.0–8.0)

## 2013-09-04 LAB — URINE MICROSCOPIC-ADD ON

## 2013-09-04 LAB — BASIC METABOLIC PANEL
BUN: 13 mg/dL (ref 6–23)
CO2: 25 mEq/L (ref 19–32)
Calcium: 9.1 mg/dL (ref 8.4–10.5)
Chloride: 104 mEq/L (ref 96–112)
Creatinine, Ser: 0.56 mg/dL (ref 0.50–1.10)
GFR calc Af Amer: >90 mL/min (ref >90)
GFR calc non Af Amer: 88 mL/min — ABNORMAL LOW (ref >90)
Glucose, Bld: 102 mg/dL — ABNORMAL HIGH (ref 70–99)
Potassium: 4 mEq/L (ref 3.7–5.3)
Sodium: 143 mEq/L (ref 137–147)

## 2013-09-04 MED ORDER — PHENAZOPYRIDINE HCL 200 MG PO TABS: 200.0000 mg | ORAL_TABLET | Freq: Three times a day (TID) | ORAL | Status: DC

## 2013-09-04 MED ORDER — CIPROFLOXACIN HCL 500 MG PO TABS: 500.0000 mg | ORAL_TABLET | Freq: Two times a day (BID) | ORAL | Status: DC

## 2013-09-04 MED ORDER — OXYCODONE-ACETAMINOPHEN 5-325 MG PO TABS: ORAL_TABLET | ORAL | Status: DC

## 2013-09-04 MED ORDER — PHENAZOPYRIDINE HCL 100 MG PO TABS
100.0000 mg | ORAL_TABLET | Freq: Three times a day (TID) | ORAL | Status: DC
  Administered 2013-09-04: 100 mg via ORAL
  Filled 2013-09-04 (×3): qty 1

## 2013-09-04 MED ORDER — OXYCODONE-ACETAMINOPHEN 5-325 MG PO TABS: 2.0000 | ORAL_TABLET | ORAL | Status: DC | PRN

## 2013-09-04 MED ORDER — IOHEXOL 300 MG/ML  SOLN
100.0000 mL | Freq: Once | INTRAMUSCULAR | Status: AC | PRN
  Administered 2013-09-04: 100 mL via INTRAVENOUS

## 2013-09-04 MED ORDER — OXYCODONE-ACETAMINOPHEN 5-325 MG PO TABS
1.0000 | ORAL_TABLET | Freq: Once | ORAL | Status: AC
  Administered 2013-09-04: 1 via ORAL
  Filled 2013-09-04: qty 1

## 2013-09-04 NOTE — Discharge Instructions (Signed)
Take all prescriptions as prescribed. Percocet, pain medication, should be taken for moderate to severe pain. It may cause drowsiness. Do not drive while taking. Be sure to follow up as scheduled with your Primary Care provider, urologist, and oncologist. Let them know you were evaluated today in the ER so all medical records can be obtained.  Return to ER for further evaluation and treatment as needed for NEW or worsening symptoms.

## 2013-09-04 NOTE — ED Notes (Signed)
Pt returned to room from CT

## 2013-09-04 NOTE — ED Notes (Signed)
Pt c/o frequency and dysuria x 6 wks.  Has been seeing a urologist for overactive bladder w/ no relief.  Is scheduled to see him again on Monday but could not wait.

## 2013-09-04 NOTE — ED Notes (Signed)
Pt transported to CT ?

## 2013-09-04 NOTE — ED Notes (Signed)
Pt reports worsening pain in "bladder area" and reports the pain has lasted 6 wks. Reports that she has a high pain tolerance but today has "pushed her over the edge" in terms of pain. Pt. A&Ox4 and in NAD.

## 2013-09-04 NOTE — ED Provider Notes (Signed)
CSN: [id number]     Arrival date & time 09/04/13  1013 History   First MD Initiated Contact with Patient 09/04/13 1042     Chief Complaint  Patient presents with  . Urinary Frequency  . Dysuria     (Consider location/radiation/quality/duration/timing/severity/associated sxs/prior Treatment) HPI Pt is a 79yo female wit hx of lung cancer and adrenal tumors presenting to ED c/o 6 week hx of urinary frequency and dysuria. Pt was initially seen by PCP, no UTI found, referred to urologist who dx her with overactive bladder, Rx-Myrbetriq for which pt has taken for 25 days. Pt states no improvement in symptoms. Reports lower abdominal pain and pressure is so bad she cannot wait for f/u appointment on Monday, 3/23. Pt states she did take OTC Azo just prior to arrival that improved pain from 10/10 to 7/10. Denies fever, n/v/d. Does report darker urine. Denies vaginal symptoms. Pt reports she is concerned for possible cancer due to her previous hx of cancer. Pt unsure if she needs an "MRI, CT, or something." pt reports having oncology f/u on Monday, 3/23 as well.    Past Medical History  Diagnosis Date  . Allergy     seasonal  . Lung cancer, upper lobe, left 2011    adenocarcinoma resected   . Cataract   . Glaucoma   . Osteoporosis   . Adenomatous colon polyp   . 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 Lung cancer surgery  08/2009  . Adrenal gland surgery  1990/2011  . Colonoscopy  multiple   No family history on file. History  Substance Use Topics  . Smoking status: Former Research scientist (life sciences)  . Smokeless tobacco: Never Used  . Alcohol Use: No   OB History   Grav Para Term Preterm Abortions TAB SAB Ect Mult Living                 Review of Systems  Constitutional: Negative for fever, chills, diaphoresis, fatigue and unexpected weight change.  Respiratory: Negative for cough and shortness of breath.   Cardiovascular: Negative for  chest pain and leg swelling.  Gastrointestinal: Positive for abdominal pain ( lower, suprapubic ). Negative for nausea, vomiting, diarrhea and constipation.  Genitourinary: Positive for dysuria, urgency, frequency, hematuria and pelvic pain. Negative for flank pain, decreased urine volume, vaginal bleeding, vaginal discharge and vaginal pain.  Musculoskeletal: Negative for back pain.  All other systems reviewed and are negative.      Allergies  Review of patient's allergies indicates no known allergies.  Home Medications   Current Outpatient Rx  Name  Route  Sig  Dispense  Refill  . alendronate (FOSAMAX) 70 MG tablet   Oral   Take 70 mg by mouth once a week.         Marland Kitchen aspirin 81 MG tablet   Oral   Take 81 mg by mouth daily.         . Cholecalciferol 2000 UNITS TABS   Oral   Take 2,000 Units by mouth daily.         . fish oil-omega-3 fatty acids 1000 MG capsule   Oral   Take 1 g by mouth daily.         . fludrocortisone (FLORINEF) 0.1 MG tablet   Oral   Take 1 tablet by mouth Daily.         . hydrocortisone (CORTEF) 10 MG tablet   Oral  Take 1 tablet by mouth Daily.         Marland Kitchen ibuprofen (ADVIL,MOTRIN) 200 MG tablet   Oral   Take 400 mg by mouth every 6 (six) hours as needed for mild pain or moderate pain.         Marland Kitchen liver oil-zinc oxide (DESITIN) 40 % ointment   Topical   Apply 1 application topically as needed for irritation (apply to vaginal area as needed for irritation).         . mirabegron ER (MYRBETRIQ) 25 MG TB24 tablet   Oral   Take 25 mg by mouth daily.         . Multiple Vitamins-Minerals (MULTIVITAMIN WITH MINERALS) tablet   Oral   Take 1 tablet by mouth daily.         . Phenazopyridine HCl (AZO TABS PO)   Oral   Take 1 tablet by mouth every 6 (six) hours as needed (UTI PAIN).         Marland Kitchen polycarbophil (FIBERCON) 625 MG tablet   Oral   Take 625 mg by mouth daily.          . TRAVATAN Z 0.004 % SOLN ophthalmic solution    Both Eyes   Place 1 drop into both eyes at bedtime.          . ciprofloxacin (CIPRO) 500 MG tablet   Oral   Take 1 tablet (500 mg total) by mouth every 12 (twelve) hours.   10 tablet   0   . oxyCODONE-acetaminophen (PERCOCET/ROXICET) 5-325 MG per tablet      Take 1-2 tabs every 4-6 hours as needed for severe pain.   10 tablet   0   . phenazopyridine (PYRIDIUM) 200 MG tablet   Oral   Take 1 tablet (200 mg total) by mouth 3 (three) times daily.   6 tablet   0    BP 146/72  Pulse 81  Temp(Src) 98.1 F (36.7 C) (Oral)  Resp 17  SpO2 94% Physical Exam  Nursing note and vitals reviewed. Constitutional: She appears well-developed and well-nourished. No distress.  Elderly thin female sitting comfortably in exam bed, NAD.     HENT:  Head: Normocephalic and atraumatic.  Eyes: Conjunctivae are normal. No scleral icterus.  Neck: Normal range of motion.  Cardiovascular: Normal rate, regular rhythm and normal heart sounds.   Pulmonary/Chest: Effort normal and breath sounds normal. No respiratory distress. She has no wheezes. She has no rales. She exhibits no tenderness.  Abdominal: Soft. Bowel sounds are normal. She exhibits no distension and no mass. There is tenderness (suprapubic). There is no rebound and no guarding.  Musculoskeletal: Normal range of motion.  Neurological: She is alert.  Skin: Skin is warm and dry. She is not diaphoretic.    ED Course  Procedures (including critical care time) Labs Review Labs Reviewed  BASIC METABOLIC PANEL - Abnormal; Notable for the following:    Glucose, Bld 102 (*)    GFR calc non Af Amer 88 (*)    All other components within normal limits  URINALYSIS, ROUTINE W REFLEX MICROSCOPIC - Abnormal; Notable for the following:    Color, Urine ORANGE (*)    Nitrite POSITIVE (*)    All other components within normal limits  URINE MICROSCOPIC-ADD ON - Abnormal; Notable for the following:    Bacteria, UA FEW (*)    All other components  within normal limits  URINE CULTURE  CBC WITH DIFFERENTIAL   Imaging Review Ct  Abdomen Pelvis W Contrast  09/04/2013   CLINICAL DATA:  Dysuria and urinary frequency. Adrenal and lung carcinoma.  EXAM: CT ABDOMEN AND PELVIS WITH CONTRAST  TECHNIQUE: Multidetector CT imaging of the abdomen and pelvis was performed using the standard protocol following bolus administration of intravenous contrast.  CONTRAST:  175mL OMNIPAQUE IOHEXOL 300 MG/ML  SOLN  COMPARISON:  09/01/2012  FINDINGS: Surgical clips again seen in both adrenal beds. Soft tissue density in the left adrenal bed measures 1.2 x 1.9 cm and is stable. No retroperitoneal lymphadenopathy identified. Moderate hiatal hernia again demonstrated. Pancreas is normal in appearance. Bilateral renal cysts again noted, however there is no evidence of renal mass or hydronephrosis.  The liver, gallbladder, and spleen are normal in appearance. Enhancing intramuscular soft tissue mass is again seen in the left posterior paraspinal region at the level of L5, which measures 2.6 x 2.0 cm on image 46. This is increased in size from 1.4 x 1.6 cm, and metastasis cannot be excluded. No other abdominal or pelvic wall soft tissue masses are identified.  Uterus and adnexal regions are unremarkable. Diverticulosis again noted, however there is no evidence of diverticulitis. No other inflammatory process or abnormal fluid collections are identified. No suspicious bone lesions identified.  IMPRESSION: Gradual enlargement of 2.6 cm enhancing mass in left paraspinal muscles at level of L5. Metastasis cannot be excluded. Consider PET-CT scan and/or percutaneous needle biopsy for further evaluation.  No other sites of recurrent or metastatic carcinoma identified within the abdomen or pelvis.  Incidental findings including moderate hiatal hernia and diverticulosis.   Electronically Signed   By: Earle Gell M.D.   On: 09/04/2013 15:25     EKG Interpretation None      MDM   Final  diagnoses:  Dysuria  UTI (lower urinary tract infection)    Pt is a 79yo female recently dx by urologist with overactive bladder presenting to ED with worsening urinary symptoms.  Pt has hx of cancer and is concerned it may have moved to her bladder. Denies vaginal symptoms.    UA: positive for nitrites, few bacteria.  Due to continued symptoms will tx for UTI with Cipro x5 days. Urine culture sent.    CBC and BMP: unremarkable.  Discussed pt with Dr. Maryan Rued who also examined pt. Pt has f/u in 2 days with oncologist and urologist, however will get CT abd/pelvis due to pt's concern for mets.    CT abd: significant for gradual enlargement of 2.6cm enhancing mass in left paraspinal muscles at level of L5.  Metastasis cannot be excluded.  Discussed findings with pt.  Recommended pt f/u with oncologist as schedule as further testing may be recommended by oncologist, including PET-CT scan.  Due to location of mass at L5, possible this may be cause of pt's urinary symptoms. Discussed this possibility with pt but advised we will tx for UTI and tx pain today. Rx: cipro, pyridium, and percocet (discontinue azo and myrbetriq at this time). Return precautions provided. Pt verbalized understanding and agreement with tx plan.   Discussed pt with Dr. Maryan Rued who agreed with tx plan.     Noland Fordyce, PA-C 09/04/13 1600

## 2013-09-05 NOTE — ED Provider Notes (Signed)
Medical screening examination/treatment/procedure(s) were conducted as a shared visit with non-physician practitioner(s) and myself.  I personally evaluated the patient during the encounter.   EKG Interpretation None      Pt with c/o of urinary sx of urgency and hesitancy over 6weeks that is not improving.  Pt being treated for overactive bladder.  UA today with questionable infection.  Also hx of adrenal CA and up for new CT.  Done today to r/o mets.  There is an area in the L4 spine which appears bigger from prior.  Findings relayed to pt who is to see her oncologist on Monday.  Blanchie Dessert, MD 09/05/13 2130

## 2013-09-06 ENCOUNTER — Ambulatory Visit (HOSPITAL_BASED_OUTPATIENT_CLINIC_OR_DEPARTMENT_OTHER): Payer: Medicare Other | Admitting: Oncology

## 2013-09-06 ENCOUNTER — Telehealth: Payer: Self-pay | Admitting: Oncology

## 2013-09-06 VITALS — BP 135/72 | HR 96 | Temp 97.0°F | Resp 18 | Ht 65.0 in | Wt 118.0 lb

## 2013-09-06 DIAGNOSIS — C3410 Malignant neoplasm of upper lobe, unspecified bronchus or lung: Secondary | ICD-10-CM

## 2013-09-06 DIAGNOSIS — C7490 Malignant neoplasm of unspecified part of unspecified adrenal gland: Secondary | ICD-10-CM

## 2013-09-06 DIAGNOSIS — R109 Unspecified abdominal pain: Secondary | ICD-10-CM

## 2013-09-06 DIAGNOSIS — C3490 Malignant neoplasm of unspecified part of unspecified bronchus or lung: Secondary | ICD-10-CM

## 2013-09-06 DIAGNOSIS — R300 Dysuria: Secondary | ICD-10-CM

## 2013-09-06 LAB — URINE CULTURE: Colony Count: 40000

## 2013-09-06 MED ORDER — OXYCODONE-ACETAMINOPHEN 5-325 MG PO TABS: ORAL_TABLET | ORAL | Status: DC

## 2013-09-06 NOTE — Progress Notes (Signed)
   [facility]    OFFICE PROGRESS NOTE   INTERVAL HISTORY:   Leslie Petersen returns for followup of lung cancer and adrenocortical carcinoma. She complains of suprapubic pain and urinary urgency for the past 5 weeks. She has been evaluated by Dr. Tresa Moore. She reports undergoing a cystoscopy. She was seen in the emergency room 09/04/2013. She was treated for a urinary tract infection.  A CT of the abdomen and pelvis on 3 21,013 revealed a stable soft tissue density in the left adrenal bed. No retroperitoneal lymphadenopathy. Enhancing intramuscular soft tissue mass at the left posterior paraspinal region L5 level was again noted, now measuring 2.6 x 2 cm. No other abdominal or pelvic soft tissue mass. No suspicious bone lesions. Objective:  Vital signs in last 24 hours:  Blood pressure 135/72, pulse 96, temperature 97 F (36.1 C), temperature source Oral, resp. rate 18, height 5\' 5"  (1.651 m), weight 118 lb (53.524 kg), SpO2 96.00%.    HEENT: Neck without mass Lymphatics: No cervical, supra-clavicular, axillary, or inguinal nodes Resp: Lungs clear bilaterally Cardio: Regular rate and rhythm GI: No hepatomegaly, nontender, no mass Vascular: No leg edema Musculoskeletal: No spine tenderness. No paraspinous mass.      Lab Results:  Lab Results  Component Value Date   WBC 5.9 09/04/2013   HGB 13.0 09/04/2013   HCT 39.1 09/04/2013   MCV 99.0 09/04/2013   PLT 270 09/04/2013   NEUTROABS 4.2 09/04/2013   X-rays: I reviewed the 09/04/2013 CT with Leslie Petersen and Leslie Petersen. The left paraspinous mass appears slightly larger.   Medications: I have reviewed the patient's current medications.  Assessment/Plan: 1. 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 2. Left upper lung adenocarcinoma (T1 N0), status post a wedge resection, 08/15/2009. 3. Left adrenocortical carcinoma, status post a left adrenalectomy in 1994. 4. CT of the chest and  abdomen, 09/04/2012. No evidence of tumor recurrence or metastatic disease in the chest or abdomen. 5. Soft tissue nodule noted on the CT 09/04/2012, posterior to the left sacrum-present on a PET/CT 07/06/2009 without hypermetabolic activity, likely benign. Larger on the pelvic CT 09/04/2013 No lesion palpable on exam today. 6. Enterococcus urinary tract infection 09/04/2013-maintained on ciprofloxacin 7. Urinary urgency/suprapubic pain-partially relieved with Percocet. Etiology unclear  Disposition:  Leslie Petersen has a history of adrenocortical carcinoma and non-small cell lung cancer. The enlarging paraspinous lesion could represent a metastasis or a different malignancy. We will arrange for a biopsy. The etiology of Leslie pain is unclear. I am concerned the pain could be related to a malignant process. We will schedule an MRI of the pelvis.  Leslie Petersen will continue Percocet as needed for pain. She will return for an office visit 09/22/2013.   Betsy Coder, MD  09/06/2013  10:10 AM

## 2013-09-06 NOTE — Telephone Encounter (Signed)
gv adn printd aptp sched and avs for pt for April.Marland KitchenMarland Kitchen

## 2013-09-13 ENCOUNTER — Encounter (HOSPITAL_COMMUNITY): Payer: Self-pay | Admitting: Emergency Medicine

## 2013-09-13 ENCOUNTER — Emergency Department (HOSPITAL_COMMUNITY)
Admission: EM | Admit: 2013-09-13 | Discharge: 2013-09-13 | Disposition: A | Discharge status: Home / Self Care | Payer: Medicare Other | Attending: Emergency Medicine | Admitting: Emergency Medicine

## 2013-09-13 ENCOUNTER — Emergency Department (HOSPITAL_COMMUNITY): Payer: Medicare Other

## 2013-09-13 DIAGNOSIS — Z860100 Personal history of colon polyps, unspecified: Secondary | ICD-10-CM | POA: Insufficient documentation

## 2013-09-13 DIAGNOSIS — K5900 Constipation, unspecified: Secondary | ICD-10-CM | POA: Insufficient documentation

## 2013-09-13 DIAGNOSIS — M810 Age-related osteoporosis without current pathological fracture: Secondary | ICD-10-CM | POA: Insufficient documentation

## 2013-09-13 DIAGNOSIS — Z79899 Other long term (current) drug therapy: Secondary | ICD-10-CM | POA: Insufficient documentation

## 2013-09-13 DIAGNOSIS — IMO0002 Reserved for concepts with insufficient information to code with codable children: Secondary | ICD-10-CM | POA: Insufficient documentation

## 2013-09-13 DIAGNOSIS — R110 Nausea: Secondary | ICD-10-CM | POA: Insufficient documentation

## 2013-09-13 DIAGNOSIS — Z85118 Personal history of other malignant neoplasm of bronchus and lung: Secondary | ICD-10-CM | POA: Insufficient documentation

## 2013-09-13 DIAGNOSIS — Z85858 Personal history of malignant neoplasm of other endocrine glands: Secondary | ICD-10-CM | POA: Insufficient documentation

## 2013-09-13 DIAGNOSIS — R630 Anorexia: Secondary | ICD-10-CM | POA: Insufficient documentation

## 2013-09-13 DIAGNOSIS — H409 Unspecified glaucoma: Secondary | ICD-10-CM | POA: Insufficient documentation

## 2013-09-13 DIAGNOSIS — R3989 Other symptoms and signs involving the genitourinary system: Secondary | ICD-10-CM | POA: Insufficient documentation

## 2013-09-13 DIAGNOSIS — R350 Frequency of micturition: Secondary | ICD-10-CM | POA: Insufficient documentation

## 2013-09-13 DIAGNOSIS — Z87891 Personal history of nicotine dependence: Secondary | ICD-10-CM | POA: Insufficient documentation

## 2013-09-13 DIAGNOSIS — Z7982 Long term (current) use of aspirin: Secondary | ICD-10-CM | POA: Insufficient documentation

## 2013-09-13 LAB — URINALYSIS, ROUTINE W REFLEX MICROSCOPIC
Bilirubin Urine: NEGATIVE
Glucose, UA: NEGATIVE mg/dL
Hgb urine dipstick: NEGATIVE
Ketones, ur: NEGATIVE mg/dL
Leukocytes, UA: NEGATIVE
Nitrite: NEGATIVE
Protein, ur: NEGATIVE mg/dL
Specific Gravity, Urine: 1.015 (ref 1.005–1.030)
Urobilinogen, UA: 0.2 mg/dL (ref 0.0–1.0)
pH: 7.5 (ref 5.0–8.0)

## 2013-09-13 LAB — CBC WITH DIFFERENTIAL/PLATELET
Basophils Absolute: 0.1 10*3/uL (ref 0.0–0.1)
Basophils Relative: 1 % (ref 0–1)
Eosinophils Absolute: 0.2 10*3/uL (ref 0.0–0.7)
Eosinophils Relative: 2 % (ref 0–5)
HCT: 39.8 % (ref 36.0–46.0)
Hemoglobin: 13.3 g/dL (ref 12.0–15.0)
Lymphocytes Relative: 22 % (ref 12–46)
Lymphs Abs: 1.8 10*3/uL (ref 0.7–4.0)
MCH: 33 pg (ref 26.0–34.0)
MCHC: 33.4 g/dL (ref 30.0–36.0)
MCV: 98.8 fL (ref 78.0–100.0)
Monocytes Absolute: 1.1 10*3/uL — ABNORMAL HIGH (ref 0.1–1.0)
Monocytes Relative: 13 % — ABNORMAL HIGH (ref 3–12)
Neutro Abs: 5.1 10*3/uL (ref 1.7–7.7)
Neutrophils Relative %: 63 % (ref 43–77)
Platelets: 220 10*3/uL (ref 150–400)
RBC: 4.03 MIL/uL (ref 3.87–5.11)
RDW: 13.9 % (ref 11.5–15.5)
WBC: 8.1 10*3/uL (ref 4.0–10.5)

## 2013-09-13 LAB — COMPREHENSIVE METABOLIC PANEL
ALT: 58 U/L — ABNORMAL HIGH (ref 0–35)
AST: 97 U/L — ABNORMAL HIGH (ref 0–37)
Albumin: 3.2 g/dL — ABNORMAL LOW (ref 3.5–5.2)
Alkaline Phosphatase: 50 U/L (ref 39–117)
BUN: 16 mg/dL (ref 6–23)
CO2: 27 mEq/L (ref 19–32)
Calcium: 9 mg/dL (ref 8.4–10.5)
Chloride: 100 mEq/L (ref 96–112)
Creatinine, Ser: 0.58 mg/dL (ref 0.50–1.10)
GFR calc Af Amer: >90 mL/min (ref >90)
GFR calc non Af Amer: 87 mL/min — ABNORMAL LOW (ref >90)
Glucose, Bld: 95 mg/dL (ref 70–99)
Potassium: 3.5 mEq/L — ABNORMAL LOW (ref 3.7–5.3)
Sodium: 138 mEq/L (ref 137–147)
Total Bilirubin: 0.8 mg/dL (ref 0.3–1.2)
Total Protein: 6.4 g/dL (ref 6.0–8.3)

## 2013-09-13 LAB — I-STAT CG4 LACTIC ACID, ED: Lactic Acid, Venous: 0.98 mmol/L (ref 0.5–2.2)

## 2013-09-13 MED ORDER — ONDANSETRON HCL 4 MG/2ML IJ SOLN
4.0000 mg | Freq: Once | INTRAMUSCULAR | Status: AC
  Administered 2013-09-13: 4 mg via INTRAVENOUS
  Filled 2013-09-13: qty 2

## 2013-09-13 MED ORDER — HYDROMORPHONE HCL PF 1 MG/ML IJ SOLN
1.0000 mg | Freq: Once | INTRAMUSCULAR | Status: AC
  Administered 2013-09-13: 1 mg via INTRAVENOUS
  Filled 2013-09-13: qty 1

## 2013-09-13 MED ORDER — MORPHINE SULFATE 4 MG/ML IJ SOLN
6.0000 mg | Freq: Once | INTRAMUSCULAR | Status: AC
  Administered 2013-09-13: 6 mg via INTRAVENOUS
  Filled 2013-09-13: qty 2

## 2013-09-13 MED ORDER — OXYCODONE-ACETAMINOPHEN 10-325 MG PO TABS: 1.0000 | ORAL_TABLET | Freq: Four times a day (QID) | ORAL | Status: DC | PRN

## 2013-09-13 NOTE — Discharge Instructions (Signed)
Return here as needed. Follow up with your doctor. Increase your fluid intake. °

## 2013-09-13 NOTE — ED Provider Notes (Signed)
Medical screening examination/treatment/procedure(s) were conducted as a shared visit with non-physician practitioner(s) and myself.  I personally evaluated the patient during the encounter.   EKG Interpretation None      Pt c/o suprapubic pain, ?hx interstitial cystitis.  Also c/o recent constipation. No abd distension. abd soft nt.  Labs.   Mirna Mires, MD 09/13/13 [phone]

## 2013-09-13 NOTE — ED Notes (Signed)
Pt reports that she has been being treated for a UTI for the past 6 weeks and has been continuing to have urinary frequency, urgency and burning. States she has been recently placed on Amoxicillin, and has since been nauseated. Has been taking narcotic pain medication and has become constipated, LBM 3/23. Pt states that this has also caused abdominal pain. Pt states that she is scheduled for an MRI on Thursday for a spot found near her spine that may be malignant, pt has previous hx of CA. Pt a&o x4, ambulatory to triage.

## 2013-09-13 NOTE — ED Provider Notes (Signed)
CSN: [id number]     Arrival date & time 09/13/13  0559 History   First MD Initiated Contact with Patient 09/13/13 [phone]     Chief Complaint  Patient presents with  . Abdominal Pain  . Urinary Tract Infection     (Consider location/radiation/quality/duration/timing/severity/associated sxs/prior Treatment) HPI: Leslie Petersen is a 79 year old woman with past medical history of lung and adrenal cancers who presents to the Emergency Department with ongoing bladder pressure and urinary frequency.  She explains that she has had these symptoms for 7-8 weeks now and has been treated here in the ED for a urinary tract infection, as well as by her urologist for what he believes may be interstitial cystitis.  There is also some concern that a new mass at L5 level discovered by CT here on 3/21 may be a possible cause of her symptoms.  She is scheduled for an MRI by her oncologist to address the mass and the possibility of metastatic disease in her pelvis contributing to her symptoms.  She is also scheduled to follow up with her urologist to undergo a "bladder pressurization" to relieve her IC symptoms.  She is here today because she "just can't take" the pain in her pelvis and would like help with symptom relief.  She describes the pain as "pressure" located in her pelvis and states that it is 10/10 in severity, relieved for about 5 minutes after she urinates.  She has taken percocet with no relief of the pain.  She is also reporting urinary frequency, anorexia, nausea, dry mouth, and constipation (last BM one week ago).  She denies dysuria, fever, and blood in her urine and stool.  She has undergone treatment with Cipro, azo, myrbetriq, and pyridium and is now being treated with amoxicillin (started yesterday), which she feels is adding to her nausea.  She has tried OTC fiber supplements and stool softeners with no relief of her constipation.  Past Medical History  Diagnosis Date  . Allergy     seasonal  . Lung  cancer, upper lobe, left 2011    adenocarcinoma resected   . Cataract   . Glaucoma   . Osteoporosis   . Adenomatous colon polyp   . 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 Lung cancer surgery  08/2009  . Adrenal gland surgery  1990/2011  . Colonoscopy  multiple   History reviewed. No pertinent family history. History  Substance Use Topics  . Smoking status: Former Research scientist (life sciences)  . Smokeless tobacco: Never Used  . Alcohol Use: No   OB History   Grav Para Term Preterm Abortions TAB SAB Ect Mult Living                 Review of Systems    Allergies  Review of patient's allergies indicates no known allergies.  Home Medications   Current Outpatient Rx  Name  Route  Sig  Dispense  Refill  . alendronate (FOSAMAX) 70 MG tablet   Oral   Take 70 mg by mouth once a week.         Marland Kitchen aspirin 81 MG tablet   Oral   Take 81 mg by mouth daily.         . Cholecalciferol 2000 UNITS TABS   Oral   Take 2,000 Units by mouth daily.         . ciprofloxacin (CIPRO) 500 MG tablet   Oral  Take 1 tablet (500 mg total) by mouth every 12 (twelve) hours.   10 tablet   0   . fish oil-omega-3 fatty acids 1000 MG capsule   Oral   Take 1 g by mouth daily.         . fludrocortisone (FLORINEF) 0.1 MG tablet   Oral   Take 1 tablet by mouth Daily.         . hydrocortisone (CORTEF) 10 MG tablet   Oral   Take 1 tablet by mouth Daily.         Marland Kitchen ibuprofen (ADVIL,MOTRIN) 200 MG tablet   Oral   Take 400 mg by mouth every 6 (six) hours as needed for mild pain or moderate pain.         Marland Kitchen liver oil-zinc oxide (DESITIN) 40 % ointment   Topical   Apply 1 application topically as needed for irritation (apply to vaginal area as needed for irritation).         . mirabegron ER (MYRBETRIQ) 25 MG TB24 tablet   Oral   Take 25 mg by mouth daily.         . Multiple Vitamins-Minerals (MULTIVITAMIN WITH MINERALS) tablet    Oral   Take 1 tablet by mouth daily.         Marland Kitchen oxyCODONE-acetaminophen (PERCOCET/ROXICET) 5-325 MG per tablet      Take 1-2 tabs every 4-6 hours as needed for severe pain.   60 tablet   0   . phenazopyridine (PYRIDIUM) 200 MG tablet   Oral   Take 1 tablet (200 mg total) by mouth 3 (three) times daily.   6 tablet   0   . Phenazopyridine HCl (AZO TABS PO)   Oral   Take 1 tablet by mouth every 6 (six) hours as needed (UTI PAIN).         Marland Kitchen polycarbophil (FIBERCON) 625 MG tablet   Oral   Take 625 mg by mouth daily.          . TRAVATAN Z 0.004 % SOLN ophthalmic solution   Both Eyes   Place 1 drop into both eyes at bedtime.           BP 159/70  Pulse 59  Temp(Src) 99 F (37.2 C) (Oral)  Resp 18  SpO2 93% Physical Exam  Nursing note and vitals reviewed. Constitutional: She is oriented to person, place, and time. She appears well-developed and well-nourished. No distress.  HENT:  Head: Normocephalic and atraumatic.  Mouth/Throat: Oropharynx is clear and moist.  Eyes: Pupils are equal, round, and reactive to light.  Neck: Normal range of motion. Neck supple.  Cardiovascular: Normal rate, regular rhythm and normal heart sounds.   Pulmonary/Chest: Effort normal and breath sounds normal.  Abdominal: Soft. Bowel sounds are normal. She exhibits no distension and no mass. There is no tenderness.  Describes increase in pressure to palpation of LLQ, RLQ, and suprapubic area  Neurological: She is alert and oriented to person, place, and time. She exhibits normal muscle tone. Coordination normal.  Skin: Skin is warm and dry.    ED Course  Procedures (including critical care time) Labs Review Labs Reviewed  CBC WITH DIFFERENTIAL - Abnormal; Notable for the following:    Monocytes Relative 13 (*)    Monocytes Absolute 1.1 (*)    All other components within normal limits  COMPREHENSIVE METABOLIC PANEL - Abnormal; Notable for the following:    Potassium 3.5 (*)    Albumin  3.2 (*)  AST 97 (*)    ALT 58 (*)    GFR calc non Af Amer 87 (*)    All other components within normal limits  URINALYSIS, ROUTINE W REFLEX MICROSCOPIC - Abnormal; Notable for the following:    Color, Urine AMBER (*)    All other components within normal limits  URINE CULTURE  I-STAT CG4 LACTIC ACID, ED   Imaging Review Dg Abd Acute W/chest  09/13/2013   CLINICAL DATA:  History of lung cancer. Lower abdominal pain and constipation.  EXAM: ACUTE ABDOMEN SERIES (ABDOMEN 2 VIEW & CHEST 1 VIEW)  COMPARISON:  CT chest, abdomen and pelvis 09/01/2012. CT abdomen and pelvis 09/04/2013.  FINDINGS: Single view of the chest demonstrates changes of emphysema. The lungs are clear. Heart size is normal. No pneumothorax or pleural effusion. Hiatal hernia is noted.  Two views of the abdomen show no free intraperitoneal air. The bowel gas pattern is unremarkable. Multiple surgical clips in the upper abdomen are identified. No abnormal abdominal calcification or focal bony abnormality is seen.  IMPRESSION: No acute finding chest or abdomen.   Electronically Signed   By: Inge Rise M.D.   On: 09/13/2013 11:04      Patient has been stable here in the emergency department.  The patient has a chronic issue with her bladder, that could be related to a cancer that has yet to be diagnosed based on the fact that she has a tumor in the posterior aspect of her lower pelvic region. Return here as needed. Increase her fluid intake. The patient has no UTI. Told to follow up with her Urologist and PCP. The patient has an MRI scheduled for this week.   Brent General, PA-C 09/13/13 1135

## 2013-09-14 LAB — URINE CULTURE
Colony Count: NO GROWTH
Culture: NO GROWTH

## 2013-09-16 ENCOUNTER — Ambulatory Visit (HOSPITAL_COMMUNITY)
Admission: RE | Admit: 2013-09-16 | Discharge: 2013-09-16 | Disposition: A | Discharge status: Home / Self Care | Payer: Medicare Other | Source: Ambulatory Visit | Attending: Oncology | Admitting: Oncology

## 2013-09-16 DIAGNOSIS — M259 Joint disorder, unspecified: Secondary | ICD-10-CM | POA: Insufficient documentation

## 2013-09-16 DIAGNOSIS — C3490 Malignant neoplasm of unspecified part of unspecified bronchus or lung: Secondary | ICD-10-CM | POA: Insufficient documentation

## 2013-09-16 DIAGNOSIS — M899 Disorder of bone, unspecified: Secondary | ICD-10-CM | POA: Insufficient documentation

## 2013-09-16 DIAGNOSIS — C7490 Malignant neoplasm of unspecified part of unspecified adrenal gland: Secondary | ICD-10-CM | POA: Insufficient documentation

## 2013-09-16 DIAGNOSIS — R109 Unspecified abdominal pain: Secondary | ICD-10-CM | POA: Insufficient documentation

## 2013-09-16 DIAGNOSIS — M949 Disorder of cartilage, unspecified: Secondary | ICD-10-CM

## 2013-09-16 DIAGNOSIS — R3915 Urgency of urination: Secondary | ICD-10-CM | POA: Insufficient documentation

## 2013-09-16 MED ORDER — GADOBENATE DIMEGLUMINE 529 MG/ML IV SOLN
9.0000 mL | Freq: Once | INTRAVENOUS | Status: AC | PRN
  Administered 2013-09-16: 9 mL via INTRAVENOUS

## 2013-09-17 ENCOUNTER — Telehealth: Payer: Self-pay | Admitting: *Deleted

## 2013-09-17 NOTE — Telephone Encounter (Signed)
Called and informed patient that MRI showed no explanation for her pain and to follow up as scheduled.  Per Dr. Benay Spice.  Patient verbalized understanding.

## 2013-09-17 NOTE — Telephone Encounter (Signed)
Message copied by Norma Fredrickson on Fri Sep 17, 2013  9:07 AM ------      Message from: Ladell Pier      Created: Fri Sep 17, 2013  8:09 AM       Please call patient, MRI with no explanation for her pain, f/u 4/8 as scheduled ------

## 2013-09-22 ENCOUNTER — Ambulatory Visit (HOSPITAL_BASED_OUTPATIENT_CLINIC_OR_DEPARTMENT_OTHER): Payer: Medicare Other | Admitting: Oncology

## 2013-09-22 ENCOUNTER — Telehealth: Payer: Self-pay | Admitting: Oncology

## 2013-09-22 VITALS — BP 153/64 | HR 92 | Temp 98.0°F | Resp 18 | Ht 65.0 in | Wt 117.5 lb

## 2013-09-22 DIAGNOSIS — C3490 Malignant neoplasm of unspecified part of unspecified bronchus or lung: Secondary | ICD-10-CM

## 2013-09-22 NOTE — Progress Notes (Signed)
  [place] OFFICE PROGRESS NOTE   Diagnosis: Adrenocortical carcinoma, non-small cell lung cancer  INTERVAL HISTORY:   She reports resolution of the pelvic pain. No difficulty with bowel or bladder function at present. No specific complaint.  Objective:    Resp: Bronchial sounds at the left upper chest, slight decrease in breath sounds at the right compared to left chest. No respiratory distress Cardio: Regular rate and rhythm GI: Nontender, no hepatomegaly, no mass Vascular: No leg edema Musculoskeletal: No palpable mass in the left paraspinous area    Lab Results:  Lab Results  Component Value Date   WBC 8.1 09/13/2013   HGB 13.3 09/13/2013   HCT 39.8 09/13/2013   MCV 98.8 09/13/2013   PLT 220 09/13/2013   NEUTROABS 5.1 09/13/2013     Imaging: MRI of the pelvis 09/16/2013-normal appearance of the bony pelvis. Lower left paraspinous soft tissue mass with avid enhancement most consistent with a neoplasm   Medications: I have reviewed the patient's current medications.  Assessment/Plan: 1. 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 2. Left upper lung adenocarcinoma (T1 N0), status post a wedge resection, 08/15/2009. 3. Left adrenocortical carcinoma, status post a left adrenalectomy in 1994. 4. CT of the chest and abdomen, 09/04/2012. No evidence of tumor recurrence or metastatic disease in the chest or abdomen. 5. Soft tissue nodule noted on the CT 09/04/2012, posterior to the left sacrum-present on a PET/CT 07/06/2009 without hypermetabolic activity. Larger on the pelvic CT 09/04/2013, the lesion had associated enhancement on an MRI 09/16/2013 6. Enterococcus urinary tract infection 09/04/2013 7. Urinary urgency/suprapubic pain-resolved   Disposition:  The pelvic pain has resolved. I reviewed the CT and MRI images with Ms. Mcwilliams today. She would like to proceed with a biopsy of the left paraspinous lesion. We will  refer her to interventional radiology. She will return for an office visit in 2 weeks.  Ladell Pier, MD  09/22/2013  8:59 AM

## 2013-09-22 NOTE — Progress Notes (Signed)
Copy of Living Will/Advance Directive forwarded to HIM to be scanned.

## 2013-09-22 NOTE — Telephone Encounter (Signed)
Gave pt appt for MD visit on april 2015

## 2013-09-24 ENCOUNTER — Encounter (HOSPITAL_COMMUNITY): Payer: Self-pay | Admitting: Pharmacy Technician

## 2013-09-27 ENCOUNTER — Other Ambulatory Visit: Payer: Self-pay | Admitting: Radiology

## 2013-09-29 ENCOUNTER — Other Ambulatory Visit: Payer: Self-pay | Admitting: Radiology

## 2013-09-30 ENCOUNTER — Other Ambulatory Visit: Payer: Self-pay | Admitting: Radiology

## 2013-09-30 ENCOUNTER — Ambulatory Visit (HOSPITAL_COMMUNITY): Admission: RE | Admit: 2013-09-30 | Payer: Medicare Other | Source: Ambulatory Visit

## 2013-09-30 ENCOUNTER — Ambulatory Visit (HOSPITAL_COMMUNITY)
Admission: RE | Admit: 2013-09-30 | Discharge: 2013-09-30 | Disposition: A | Discharge status: Home / Self Care | Payer: Medicare Other | Source: Ambulatory Visit | Attending: Oncology | Admitting: Oncology

## 2013-10-01 ENCOUNTER — Other Ambulatory Visit: Payer: Self-pay | Admitting: Oncology

## 2013-10-01 ENCOUNTER — Ambulatory Visit (HOSPITAL_COMMUNITY)
Admission: RE | Admit: 2013-10-01 | Discharge: 2013-10-01 | Disposition: A | Discharge status: Home / Self Care | Payer: Medicare Other | Source: Ambulatory Visit | Attending: Oncology | Admitting: Oncology

## 2013-10-01 ENCOUNTER — Encounter (HOSPITAL_COMMUNITY): Payer: Self-pay

## 2013-10-01 VITALS — BP 132/63 | HR 85 | Resp 18

## 2013-10-01 DIAGNOSIS — C3490 Malignant neoplasm of unspecified part of unspecified bronchus or lung: Secondary | ICD-10-CM

## 2013-10-01 DIAGNOSIS — Z87891 Personal history of nicotine dependence: Secondary | ICD-10-CM | POA: Insufficient documentation

## 2013-10-01 DIAGNOSIS — H409 Unspecified glaucoma: Secondary | ICD-10-CM | POA: Insufficient documentation

## 2013-10-01 DIAGNOSIS — Z860100 Personal history of colon polyps, unspecified: Secondary | ICD-10-CM | POA: Insufficient documentation

## 2013-10-01 DIAGNOSIS — M810 Age-related osteoporosis without current pathological fracture: Secondary | ICD-10-CM | POA: Insufficient documentation

## 2013-10-01 DIAGNOSIS — Z79899 Other long term (current) drug therapy: Secondary | ICD-10-CM | POA: Insufficient documentation

## 2013-10-01 DIAGNOSIS — C786 Secondary malignant neoplasm of retroperitoneum and peritoneum: Secondary | ICD-10-CM | POA: Insufficient documentation

## 2013-10-01 LAB — CBC
HCT: 43.4 % (ref 36.0–46.0)
Hemoglobin: 14.1 g/dL (ref 12.0–15.0)
MCH: 32.8 pg (ref 26.0–34.0)
MCHC: 32.5 g/dL (ref 30.0–36.0)
MCV: 100.9 fL — ABNORMAL HIGH (ref 78.0–100.0)
PLATELETS: 274 10*3/uL (ref 150–400)
RBC: 4.3 MIL/uL (ref 3.87–5.11)
RDW: 13.6 % (ref 11.5–15.5)
WBC: 7.1 10*3/uL (ref 4.0–10.5)

## 2013-10-01 LAB — APTT: APTT: 29 s (ref 24–37)

## 2013-10-01 LAB — PROTIME-INR
INR: 1.01 (ref 0.00–1.49)
Prothrombin Time: 13.1 seconds (ref 11.6–15.2)

## 2013-10-01 MED ORDER — SODIUM CHLORIDE 0.9 % IV SOLN
INTRAVENOUS | Status: DC
  Administered 2013-10-01: 12:00:00 via INTRAVENOUS

## 2013-10-01 MED ORDER — HYDROCODONE-ACETAMINOPHEN 5-325 MG PO TABS
1.0000 | ORAL_TABLET | ORAL | Status: DC | PRN
  Filled 2013-10-01: qty 2

## 2013-10-01 MED ORDER — FENTANYL CITRATE 0.05 MG/ML IJ SOLN
INTRAMUSCULAR | Status: AC
  Filled 2013-10-01: qty 4

## 2013-10-01 MED ORDER — MIDAZOLAM HCL 2 MG/2ML IJ SOLN
INTRAMUSCULAR | Status: AC
  Filled 2013-10-01: qty 4

## 2013-10-01 NOTE — H&P (Signed)
WIll try under [place] first, then CT if needed.

## 2013-10-01 NOTE — Progress Notes (Signed)
Per Roe Coombs, RN pt can be discharged immediately since she received no medications or sedation during her procedure.  Discharge paperwork reviewed and signed.  Pt discharged to home in stable condition with daughter.  Garry Heater, RN 10/01/2013

## 2013-10-01 NOTE — Procedures (Signed)
[place] core L paraspinal bx 18g x3 to surg path No complication No blood loss. See complete dictation in [facility].

## 2013-10-01 NOTE — H&P (Signed)
Chief Complaint: "I'm here for a biopsy" Referring Physician:Sherrill HPI: Leslie Petersen is an 79 y.o. female with hx of lung and adrenal cancer. She went to the ER with some urinary sxs and a CT was done. This incidentally found a lesion in the posterior paraspinous muscles on the left of the lower lumbar region. She then had MRI and is now scheduled for biopsy to assess for malignant disease. PMHx and meds reviewed. Pt feels ok this today.   Past Medical History:  Past Medical History  Diagnosis Date  . Allergy     seasonal  . Lung cancer, upper lobe, left 2011    adenocarcinoma resected   . Cataract   . Glaucoma   . Osteoporosis   . Adenomatous colon polyp   . 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 Cancer     lung    Past Surgical History:  Past Surgical History  Procedure Laterality Date  . Lung cancer surgery  08/2009  . Adrenal gland surgery  1990/2011  . Colonoscopy  multiple    Family History: History reviewed. No pertinent family history.  Social History:  reports that she has quit smoking. She has never used smokeless tobacco. She reports that she does not drink alcohol or use illicit drugs.  Allergies:  Allergies  Allergen Reactions  . Mirabegron Other (See Comments)    Patient had adverse effect to medication (patient does not want to take any overactive bladder medications).    Medications:   Medication List    ASK your doctor about these medications       alendronate 70 MG tablet  Commonly known as:  FOSAMAX  Take 70 mg by mouth once a week.     aspirin EC 81 MG tablet  Take 81 mg by mouth at bedtime.     Cholecalciferol 2000 UNITS Tabs  Take 2,000 Units by mouth daily.     docusate sodium 100 MG capsule  Commonly known as:  COLACE  Take 100-200 mg by mouth daily as needed for mild constipation.     fish oil-omega-3 fatty acids 1000 MG capsule  Take 1 g by mouth daily.     fludrocortisone 0.1 MG tablet  Commonly known as:   FLORINEF  Take 0.1 mg by mouth Daily.     hydrocortisone 10 MG tablet  Commonly known as:  CORTEF  Take 10 mg by mouth Daily.     liver oil-zinc oxide 40 % ointment  Commonly known as:  DESITIN  Apply 1 application topically as needed for irritation (apply to vaginal area as needed for irritation).     multivitamin with minerals tablet  Take 1 tablet by mouth daily.     oxyCODONE-acetaminophen 10-325 MG per tablet  Commonly known as:  PERCOCET  Take 1 tablet by mouth every 6 (six) hours as needed for pain.     polycarbophil 625 MG tablet  Commonly known as:  FIBERCON  Take 1,250 mg by mouth daily as needed for mild constipation.     TRAVATAN Z 0.004 % Soln ophthalmic solution  Generic drug:  Travoprost (BAK Free)  Place 1 drop into both eyes at bedtime.        Please HPI for pertinent positives, otherwise complete 10 system ROS negative.  Physical Exam: BP 144/72  Pulse 89  Temp(Src) 97.9 F (36.6 C) (Oral)  Resp 18  SpO2 96% There is no weight on file to calculate BMI.   General Appearance:  Alert, cooperative, no distress, appears stated age  Head:  Normocephalic, without obvious abnormality, atraumatic  ENT: Unremarkable  Neck: Supple, symmetrical, trachea midline  Lungs:   Clear to auscultation bilaterally, no w/r/r, respirations unlabored without use of accessory muscles.  Chest Wall:  No tenderness or deformity  Heart:  Regular rate and rhythm, S1, S2 normal, no murmur, rub or gallop.  Neurologic: Normal affect, no gross deficits.   Results for orders placed during the hospital encounter of 10/01/13 (from the past 48 hour(s))  CBC     Status: Abnormal   Collection Time    10/01/13 11:15 AM      Result Value Ref Range   WBC 7.1  4.0 - 10.5 K/uL   RBC 4.30  3.87 - 5.11 MIL/uL   Hemoglobin 14.1  12.0 - 15.0 g/dL   HCT 43.4  36.0 - 46.0 %   MCV 100.9 (*) 78.0 - 100.0 fL   MCH 32.8  26.0 - 34.0 pg   MCHC 32.5  30.0 - 36.0 g/dL   RDW 13.6  11.5 - 15.5 %    Platelets 274  150 - 400 K/uL   No results found.  Assessment/Plan Hx lung and adrenal cancer New left lumbar paraspinous muscle mass For CT guided biopsy Explained procedure, risks, complications, use of sedation. Labs pending Consent signed in chart  Ascencion Dike PA-C 10/01/2013, 12:33 PM

## 2013-10-01 NOTE — Discharge Instructions (Signed)
Biopsy Care After Refer to this sheet in the next few weeks. These instructions provide you with information on caring for yourself after your procedure. Your caregiver may also give you more specific instructions. Your treatment has been planned according to current medical practices, but problems sometimes occur. Call your caregiver if you have any problems or questions after your procedure. If you had a fine needle biopsy, you may have soreness at the biopsy site for 1 to 2 days. If you had an open biopsy, you may have soreness at the biopsy site for 3 to 4 days. HOME CARE INSTRUCTIONS   You may resume normal diet and activities as directed.  You may shower after 24 hrs. Do not use lotions or powders near the site until healed.  If your wound was closed with a skin glue (adhesive), it will wear off and begin to peel in 7 days.  Only take over-the-counter or prescription medicines for pain, discomfort, or fever as directed by your caregiver.  Ask your caregiver when you can bathe and get your wound wet. SEEK IMMEDIATE MEDICAL CARE IF:   You have increased bleeding (more than a small spot) from the biopsy site.  You notice redness, swelling, or increasing pain at the biopsy site.  You have pus coming from the biopsy site.  You have a fever.  You notice a bad smell coming from the biopsy site or dressing.  You have a rash, have difficulty breathing, or have any allergic problems. MAKE SURE YOU:   Understand these instructions.  Will watch your condition.  Will get help right away if you are not doing well or get worse. Document Released: 12/21/2004 Document Revised: 08/26/2011 Document Reviewed: 11/29/2010 [facility] Patient Information 2014 [place].

## 2013-10-06 ENCOUNTER — Telehealth: Payer: Self-pay | Admitting: *Deleted

## 2013-10-06 NOTE — Telephone Encounter (Signed)
Call from pt's daughter [name] requesting results of biopsy. Reviewed with Dr. Benay Spice, results given. Will discuss plan at office visit 4/24. [name] voiced understanding.

## 2013-10-08 ENCOUNTER — Other Ambulatory Visit: Payer: Self-pay | Admitting: *Deleted

## 2013-10-08 ENCOUNTER — Ambulatory Visit (HOSPITAL_BASED_OUTPATIENT_CLINIC_OR_DEPARTMENT_OTHER): Payer: Medicare Other | Admitting: Oncology

## 2013-10-08 VITALS — BP 145/57 | HR 94 | Temp 97.8°F | Resp 20 | Ht 65.0 in | Wt 117.8 lb

## 2013-10-08 DIAGNOSIS — C7951 Secondary malignant neoplasm of bone: Secondary | ICD-10-CM

## 2013-10-08 DIAGNOSIS — C7490 Malignant neoplasm of unspecified part of unspecified adrenal gland: Secondary | ICD-10-CM

## 2013-10-08 DIAGNOSIS — C7952 Secondary malignant neoplasm of bone marrow: Secondary | ICD-10-CM

## 2013-10-08 DIAGNOSIS — C3410 Malignant neoplasm of upper lobe, unspecified bronchus or lung: Secondary | ICD-10-CM

## 2013-10-08 NOTE — Progress Notes (Signed)
  [place] OFFICE PROGRESS NOTE   Diagnosis: Adrenocortical carcinoma  INTERVAL HISTORY:   She returns as scheduled. She underwent an ultrasound-guided biopsy of the left paraspinous mass on 10/01/2013. She reports tolerating the procedure well.   Leslie Petersen reports minimal dysuria. No pain.  The biopsy ([id number]) confirmed a metastatic malignancy consistent with metastatic adrenocortical carcinoma. The tumor has similar morphologic features to the previous adrenocortical neoplasm 10/09/2009.  Objective:  Vital signs in last 24 hours:  Blood pressure 145/57, pulse 94, temperature 97.8 F (36.6 C), temperature source Oral, resp. rate 20, height 5\' 5"  (1.651 m), weight 117 lb 12.8 oz (53.434 kg).    HEENT: Neck without mass Lymphatics: No cervical, supraclavicular, axillary, or inguinal nodes Resp: Lungs clear bilaterally Cardio: Regular rate and rhythm GI: No hepatosplenomegaly Vascular: No leg edema Musculoskeletal: No palpable mass at the left lower paraspinous area.  Skin: Biopsy site without evidence of an effect     Lab Results:  Lab Results  Component Value Date   WBC 7.1 10/01/2013   HGB 14.1 10/01/2013   HCT 43.4 10/01/2013   MCV 100.9* 10/01/2013   PLT 274 10/01/2013   NEUTROABS 5.1 09/13/2013     Medications: I have reviewed the patient's current medications.  Assessment/Plan: 1. 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 2. Left upper lung adenocarcinoma (T1 N0), status post a wedge resection, 08/15/2009. 3. Left adrenocortical carcinoma, status post a left adrenalectomy in 1994. 4. CT of the chest and abdomen, 09/04/2012. No evidence of tumor recurrence or metastatic disease in the chest or abdomen. 5. Soft tissue nodule noted on the CT 09/04/2012, posterior to the left sacrum-present on a PET/CT 07/06/2009 without hypermetabolic activity. Larger on the pelvic CT 09/04/2013, the lesion had associated  enhancement on an MRI 09/16/2013  Biopsy of the left paraspinous mass 10/01/2013 confirmed metastatic carcinoma consistent with metastatic adrenocortical carcinoma 6. Enterococcus urinary tract infection 09/04/2013 7. Urinary urgency/suprapubic pain-resolved   Disposition:  I discussed the biopsy findings with Leslie Petersen in her daughter. The biopsies consistent with metastatic adrenocortical carcinoma. There is no clinical evidence of additional metastatic disease. She will be referred for a staging PET scan.  We will refer her to consider surgical resection if this is the only site of metastatic disease. I will present her case at the GI tumor conference 10/13/2013. We will ask for a pathology and x-ray review. The differential diagnosis includes metastatic lung cancer. This lesion was present prior to the lung and adrenal glands surgeries in 2011.  Leslie Pier, MD  10/08/2013  6:08 PM

## 2013-10-11 ENCOUNTER — Telehealth: Payer: Self-pay | Admitting: *Deleted

## 2013-10-11 NOTE — Telephone Encounter (Signed)
Saw PCP for possible bladder infection. Was given Cipro. Having nausea and anorexia. Daughter is concerned that pt is not eating, vomited today. Asking if cancer can be causing recurrent infection? Wonders if cancer is causing anorexia. Per Dr. Benay Spice: will have a better idea once PET is reviewed. Pt is scheduled for PET 4/29 at 0700.

## 2013-10-12 ENCOUNTER — Ambulatory Visit: Payer: Medicare Other | Admitting: Oncology

## 2013-10-13 ENCOUNTER — Ambulatory Visit (HOSPITAL_COMMUNITY)
Admission: RE | Admit: 2013-10-13 | Discharge: 2013-10-13 | Disposition: A | Discharge status: Home / Self Care | Payer: Medicare Other | Source: Ambulatory Visit | Attending: Oncology | Admitting: Oncology

## 2013-10-13 ENCOUNTER — Encounter (HOSPITAL_COMMUNITY): Payer: Self-pay

## 2013-10-13 DIAGNOSIS — C7490 Malignant neoplasm of unspecified part of unspecified adrenal gland: Secondary | ICD-10-CM

## 2013-10-13 DIAGNOSIS — Z9089 Acquired absence of other organs: Secondary | ICD-10-CM | POA: Insufficient documentation

## 2013-10-13 DIAGNOSIS — Z902 Acquired absence of lung [part of]: Secondary | ICD-10-CM | POA: Insufficient documentation

## 2013-10-13 DIAGNOSIS — C7970 Secondary malignant neoplasm of unspecified adrenal gland: Secondary | ICD-10-CM | POA: Insufficient documentation

## 2013-10-13 LAB — GLUCOSE, CAPILLARY: Glucose-Capillary: 82 mg/dL (ref 70–99)

## 2013-10-13 MED ORDER — FLUDEOXYGLUCOSE F - 18 (FDG) INJECTION
7.4000 | Freq: Once | INTRAVENOUS | Status: AC | PRN
  Administered 2013-10-13: 7.4 via INTRAVENOUS

## 2013-10-15 ENCOUNTER — Telehealth: Payer: Self-pay | Admitting: *Deleted

## 2013-10-15 NOTE — Telephone Encounter (Signed)
PER DR.SHERRILL- SPOT BY SPINE WAS NOTED. OTHERWISE REPORT OK. DR.SHERRILL HAS SPOKEN WITH DR.GERKIN WHO IS CHECKING WITH AN ORTHOPEDIC SURGEON TO SEE WHO NEEDS TO DO THE SURGERY ON PT.'S SPINE. DR.GERKIN SHOULD KNOW THE ANSWER BY Monday. NOTIFIED PT. SHE VOICES UNDERSTANDING. SHE WILL CHECK IN WITH DR.SHERRILL'S NURSE ON Monday AFTERNOON.

## 2013-10-15 NOTE — Telephone Encounter (Signed)
Asking for PET results from 4/29 and "what is the next step"?  Next OV 5/21-message to MD

## 2013-10-18 ENCOUNTER — Other Ambulatory Visit: Payer: Self-pay | Admitting: Oncology

## 2013-10-18 NOTE — Telephone Encounter (Signed)
Notified pt per Dr. Benay Spice that he would be making referral to Dr. Magda Bernheim at [facility], Orthopaedic Surgeon who will do pt's surgery.  Pt states "oh, I can't get all the way to [facility]; is there anyone here that can do surgery?; please ask Dr. Benay Spice"  Note to Dr. Benay Spice.

## 2013-10-19 ENCOUNTER — Telehealth: Payer: Self-pay | Admitting: Oncology

## 2013-10-19 ENCOUNTER — Other Ambulatory Visit: Payer: Self-pay | Admitting: *Deleted

## 2013-10-19 DIAGNOSIS — C3490 Malignant neoplasm of unspecified part of unspecified bronchus or lung: Secondary | ICD-10-CM

## 2013-10-19 NOTE — Telephone Encounter (Signed)
Pt appt. To see Dr. Magda Bernheim @ [facility] is 10/26/13@9 :10. Medical records faxed, slides and scans will be fedex'ed. Pt is aware

## 2013-10-19 NOTE — Progress Notes (Signed)
Referral to Dr. Magda Bernheim at [facility] forwarded to [facility] in HIM department per Dr. Benay Spice. Assess for surgery for lesion on spine.

## 2013-10-19 NOTE — Telephone Encounter (Signed)
Faxed pt medical records to Dr Nicki Reaper Wilson's office. Nurse will triage records and call pt with appt.

## 2013-10-20 ENCOUNTER — Telehealth: Payer: Self-pay | Admitting: *Deleted

## 2013-10-20 NOTE — Telephone Encounter (Signed)
Asking what stage is her cancer? If any treatment needed after surgery, can it be done in [place]? Sees Dr. Magda Bernheim on 10/26/13.

## 2013-10-22 ENCOUNTER — Telehealth: Payer: Self-pay | Admitting: *Deleted

## 2013-10-22 NOTE — Telephone Encounter (Signed)
Call from pt asking if scans have been sent by [employer] to [facility]? Confirmed with Maudie Mercury in HIM- scans have been sent. Pt made aware.

## 2013-10-28 ENCOUNTER — Telehealth: Payer: Self-pay | Admitting: *Deleted

## 2013-10-28 NOTE — Telephone Encounter (Signed)
Call from [place] with Dr. Magda Bernheim requesting pathology tissue to be sent to [facility] for review. Request forwarded to [facility] in HIM-referrals.

## 2013-10-28 NOTE — Telephone Encounter (Signed)
Called pt to follow up after message left on 5/6. Pt has seen Dr. Redmond Pulling, she is expecting a call from within the next week or so with a plan for possible surgery. She understands from their conversation that if she needs radiation it can be done in [place]. Pt denied any questions or other needs, understands to call as needed.

## 2013-11-03 ENCOUNTER — Telehealth: Payer: Self-pay | Admitting: *Deleted

## 2013-11-03 NOTE — Telephone Encounter (Signed)
Pt called stating she is cancelling appt with Dr. Benay Spice 11/04/13 because "I'm waiting to hear back from Dr. Dois Davenport office if I'm going to have surgery or radiation or what?"  Pt states she will call [facility] to re-schedule MD appt after she hears from Dr. Redmond Pulling.  Dr. Benay Spice made aware.

## 2013-11-04 ENCOUNTER — Ambulatory Visit: Payer: Medicare Other | Admitting: Nurse Practitioner

## 2013-11-12 ENCOUNTER — Telehealth (INDEPENDENT_AMBULATORY_CARE_PROVIDER_SITE_OTHER): Payer: Self-pay

## 2013-11-12 ENCOUNTER — Telehealth: Payer: Self-pay | Admitting: *Deleted

## 2013-11-12 NOTE — Telephone Encounter (Signed)
Message from pt requesting to cancel appt with Dr. Harlow Asa. Reviewed with Dr. Benay Spice: OK to cancel, still awaiting recommendation from Dr. Redmond Pulling.

## 2013-11-12 NOTE — Telephone Encounter (Signed)
Will forward this to Dr Harlow Asa for review.

## 2013-11-12 NOTE — Telephone Encounter (Signed)
Message copied by Dois Davenport on Fri Nov 12, 2013 10:28 AM ------      Message from: Joselyn Arrow      Created: Fri Nov 12, 2013 10:24 AM      Regarding: APPT       PT CALLED TO CX APPT 11/16/13 WITH [facility].  STATES SHE FEELS "APPT NOT NEEDED UNTIL SHE KNOWS WHAT TX IS NEEDED FOR HER DX".  I DID NOT CX HER APPT. I INSTRUCTED PT TO CONTACT DR Odis Hollingshead' OFFICE AND INFORM THEM OF HER REQUEST.  THEIR OFFICE SHOULD LET us KNOW HOW TO PROCEED.  GM ------

## 2013-11-15 HISTORY — PX: OTHER SURGICAL HISTORY: SHX169

## 2013-11-15 NOTE — Telephone Encounter (Signed)
Agree with cancelling consultation at my office.  I have discussed patient with Dr. Benay Spice and she will require evaluation by orthopedic oncologist.  Dr. Benay Spice is arranging this consultation.  At this point, I have no active role in her care.  Will see as needed.  Earnstine Regal, MD, [facility] Office: [phone]

## 2013-11-16 ENCOUNTER — Ambulatory Visit (INDEPENDENT_AMBULATORY_CARE_PROVIDER_SITE_OTHER): Payer: Self-pay | Admitting: Surgery

## 2013-11-18 ENCOUNTER — Telehealth: Payer: Self-pay | Admitting: *Deleted

## 2013-11-18 NOTE — Telephone Encounter (Signed)
Call from pt reporting Dr. Redmond Pulling has recommended surgery. She asks if he is the best one to do it of if Dr. Benay Spice can recommend someone locally. Per Dr. Benay Spice, Dr. Redmond Pulling is who he would recommend. There is no MD locally to perform this surgery. Pt voiced understanding. She understands to call office with surgery date after next visit with Dr. Redmond Pulling.

## 2013-12-16 ENCOUNTER — Telehealth: Payer: Self-pay | Admitting: *Deleted

## 2013-12-16 NOTE — Telephone Encounter (Signed)
Called patient to check on her- had her surgery at [facility] by Dr. Redmond Pulling on 12/03/13. Feels good-ambulatory and not in pain. Sees PA next week. Made her aware we will call back to schedule her follow up with Dr. Benay Spice. She understands and agrees.

## 2013-12-20 ENCOUNTER — Other Ambulatory Visit: Payer: Self-pay | Admitting: *Deleted

## 2013-12-21 ENCOUNTER — Other Ambulatory Visit: Payer: Self-pay | Admitting: *Deleted

## 2013-12-21 ENCOUNTER — Telehealth: Payer: Self-pay | Admitting: *Deleted

## 2013-12-21 NOTE — Telephone Encounter (Signed)
Order from Dr. Benay Spice for office visit within next 1 mo. Order sent to schedulers. Spoke with pt, she reports Dr. Redmond Pulling has released her. He recommends CT scans in 6 mos.

## 2013-12-22 ENCOUNTER — Telehealth: Payer: Self-pay | Admitting: Oncology

## 2013-12-22 NOTE — Telephone Encounter (Signed)
S/w the pt and she is aware of her aug 2015 appts.

## 2014-01-17 ENCOUNTER — Ambulatory Visit (HOSPITAL_BASED_OUTPATIENT_CLINIC_OR_DEPARTMENT_OTHER): Payer: Medicare Other | Admitting: Oncology

## 2014-01-17 ENCOUNTER — Telehealth: Payer: Self-pay | Admitting: Oncology

## 2014-01-17 VITALS — BP 154/68 | HR 93 | Temp 98.4°F | Resp 18 | Wt 121.4 lb

## 2014-01-17 DIAGNOSIS — C7490 Malignant neoplasm of unspecified part of unspecified adrenal gland: Secondary | ICD-10-CM

## 2014-01-17 DIAGNOSIS — C3410 Malignant neoplasm of upper lobe, unspecified bronchus or lung: Secondary | ICD-10-CM

## 2014-01-17 NOTE — Progress Notes (Signed)
  [place] OFFICE PROGRESS NOTE   Diagnosis: Adrenocortical carcinoma  INTERVAL HISTORY:   [name] underwent resection of the paraspinous mass on 12/03/2013 by Dr. Redmond Pulling at [facility]. Dissection was carried down through the skin and subcutaneous tissues to the lumbodorsal fascia and muscle. A complete resection of the lesion was performed. No invasion into the bone of the posterior superior iliac spine was noted. Wide visual margins were obtained. The pathology returned consistent with metastatic adrenal cortical carcinoma. The margins were negative. The tumor is within skeletal muscle tissue and fibrous connective tissue.   She reports feeling well. She has noted an area of erythema at the left lower back for the past few days. No pruritus Objective:  Vital signs in last 24 hours:  Blood pressure 154/68, pulse 93, temperature 98.4 F (36.9 C), temperature source Oral, resp. rate 18, weight 121 lb 6.4 oz (55.067 kg), SpO2 97.00%.    HEENT: Neck without mass Lymphatics: No cervical, supra-clavicular, axillary, or inguinal nodes Resp: Lungs clear bilaterally Cardio: Regular rate and rhythm GI: No hepatomegaly, nontender, no mass Vascular: No leg edema  Skin: Healed incision at the left lower back without evidence of infection. Superior to the incision there is a 3 cm circular area of erythema that is slightly raised. No vesicles. Musculoskeletal: Mass at the lower back   Medications: I have reviewed the patient's current medications.  Assessment/Plan: 1. 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 2. Left upper lung adenocarcinoma (T1 N0), status post a wedge resection, 08/15/2009. 3. Left adrenocortical carcinoma, status post a left adrenalectomy in 1994. 4. CT of the chest and abdomen, 09/04/2012. No evidence of tumor recurrence or metastatic disease in the chest or abdomen. 5. Soft tissue nodule noted on the CT  09/04/2012, posterior to the left sacrum-present on a PET/CT 07/06/2009 without hypermetabolic activity. Larger on the pelvic CT 09/04/2013, the lesion had associated enhancement on an MRI 09/16/2013 Biopsy of the left paraspinous mass 10/01/2013 confirmed metastatic carcinoma consistent with metastatic adrenocortical carcinoma Excision of the left paraspinous mass at [facility] on 12/03/2013 with the pathology confirming metastatic adrenocortical carcinoma with negative surgical margins 6. Enterococcus urinary tract infection 09/04/2013 7. Circular area of erythema at the left lower back-? Yeast rash   Disposition:  Leslie Petersen underwent resection of the left paraspinous mass and the pathology confirmed metastatic adrenal cortical carcinoma. The resection margins are negative. We will follow her with observation. She will return for an office visit in 3 months. We will plan for a restaging CT evaluation in approximately 6 months. Leslie Coder, MD  01/17/2014  2:11 PM

## 2014-01-17 NOTE — Telephone Encounter (Signed)
gv and printed appt sched adn avs for pt for NOV °

## 2014-02-17 ENCOUNTER — Emergency Department (HOSPITAL_COMMUNITY): Payer: Medicare Other

## 2014-02-17 ENCOUNTER — Observation Stay (HOSPITAL_COMMUNITY)
Admission: EM | Admit: 2014-02-17 | Discharge: 2014-02-18 | Disposition: A | Discharge status: Home / Self Care | Payer: Medicare Other | Attending: Internal Medicine | Admitting: Internal Medicine

## 2014-02-17 ENCOUNTER — Encounter (HOSPITAL_COMMUNITY): Payer: Self-pay | Admitting: Emergency Medicine

## 2014-02-17 DIAGNOSIS — C7400 Malignant neoplasm of cortex of unspecified adrenal gland: Secondary | ICD-10-CM

## 2014-02-17 DIAGNOSIS — Z7982 Long term (current) use of aspirin: Secondary | ICD-10-CM | POA: Insufficient documentation

## 2014-02-17 DIAGNOSIS — R079 Chest pain, unspecified: Secondary | ICD-10-CM | POA: Diagnosis present

## 2014-02-17 DIAGNOSIS — Z87891 Personal history of nicotine dependence: Secondary | ICD-10-CM | POA: Diagnosis not present

## 2014-02-17 DIAGNOSIS — M810 Age-related osteoporosis without current pathological fracture: Secondary | ICD-10-CM | POA: Diagnosis not present

## 2014-02-17 DIAGNOSIS — Z79899 Other long term (current) drug therapy: Secondary | ICD-10-CM | POA: Diagnosis not present

## 2014-02-17 DIAGNOSIS — Z85118 Personal history of other malignant neoplasm of bronchus and lung: Secondary | ICD-10-CM | POA: Diagnosis not present

## 2014-02-17 DIAGNOSIS — C7490 Malignant neoplasm of unspecified part of unspecified adrenal gland: Secondary | ICD-10-CM

## 2014-02-17 DIAGNOSIS — Z860100 Personal history of colon polyps, unspecified: Secondary | ICD-10-CM | POA: Insufficient documentation

## 2014-02-17 DIAGNOSIS — R0789 Other chest pain: Secondary | ICD-10-CM | POA: Diagnosis not present

## 2014-02-17 DIAGNOSIS — I959 Hypotension, unspecified: Secondary | ICD-10-CM | POA: Diagnosis not present

## 2014-02-17 DIAGNOSIS — M79609 Pain in unspecified limb: Secondary | ICD-10-CM | POA: Diagnosis not present

## 2014-02-17 DIAGNOSIS — C3490 Malignant neoplasm of unspecified part of unspecified bronchus or lung: Secondary | ICD-10-CM | POA: Insufficient documentation

## 2014-02-17 DIAGNOSIS — I10 Essential (primary) hypertension: Secondary | ICD-10-CM | POA: Insufficient documentation

## 2014-02-17 DIAGNOSIS — H409 Unspecified glaucoma: Secondary | ICD-10-CM | POA: Insufficient documentation

## 2014-02-17 LAB — CBC
HCT: 39.9 % (ref 36.0–46.0)
HEMOGLOBIN: 13.3 g/dL (ref 12.0–15.0)
MCH: 32.2 pg (ref 26.0–34.0)
MCHC: 33.3 g/dL (ref 30.0–36.0)
MCV: 96.6 fL (ref 78.0–100.0)
Platelets: 219 10*3/uL (ref 150–400)
RBC: 4.13 MIL/uL (ref 3.87–5.11)
RDW: 13.6 % (ref 11.5–15.5)
WBC: 5.9 10*3/uL (ref 4.0–10.5)

## 2014-02-17 LAB — BASIC METABOLIC PANEL
Anion gap: 12 (ref 5–15)
BUN: 17 mg/dL (ref 6–23)
CALCIUM: 9.4 mg/dL (ref 8.4–10.5)
CHLORIDE: 102 meq/L (ref 96–112)
CO2: 23 mEq/L (ref 19–32)
CREATININE: 0.6 mg/dL (ref 0.50–1.10)
GFR calc non Af Amer: 86 mL/min — ABNORMAL LOW (ref >90)
Glucose, Bld: 115 mg/dL — ABNORMAL HIGH (ref 70–99)
Potassium: 3.7 mEq/L (ref 3.7–5.3)
Sodium: 137 mEq/L (ref 137–147)

## 2014-02-17 LAB — I-STAT TROPONIN, ED: TROPONIN I, POC: 0 ng/mL (ref 0.00–0.08)

## 2014-02-17 LAB — TROPONIN I: Troponin I: <0.3 ng/mL (ref <0.30)

## 2014-02-17 MED ORDER — GI COCKTAIL ~~LOC~~
30.0000 mL | Freq: Four times a day (QID) | ORAL | Status: DC | PRN
  Filled 2014-02-17: qty 30

## 2014-02-17 MED ORDER — LATANOPROST 0.005 % OP SOLN
1.0000 [drp] | Freq: Every day | OPHTHALMIC | Status: DC
  Administered 2014-02-17: 1 [drp] via OPHTHALMIC
  Filled 2014-02-17: qty 2.5

## 2014-02-17 MED ORDER — NITROGLYCERIN 0.4 MG SL SUBL
0.4000 mg | SUBLINGUAL_TABLET | SUBLINGUAL | Status: DC | PRN
  Administered 2014-02-17 (×3): 0.4 mg via SUBLINGUAL
  Filled 2014-02-17 (×2): qty 1

## 2014-02-17 MED ORDER — HYDROCORTISONE 10 MG PO TABS
10.0000 mg | ORAL_TABLET | Freq: Every day | ORAL | Status: DC
  Administered 2014-02-18: 10 mg via ORAL
  Filled 2014-02-17 (×2): qty 1

## 2014-02-17 MED ORDER — DOCUSATE SODIUM 100 MG PO CAPS
100.0000 mg | ORAL_CAPSULE | Freq: Every day | ORAL | Status: DC | PRN
  Filled 2014-02-17: qty 2

## 2014-02-17 MED ORDER — MORPHINE SULFATE 2 MG/ML IJ SOLN: 1.0000 mg | INTRAMUSCULAR | Status: DC | PRN

## 2014-02-17 MED ORDER — ONDANSETRON HCL 4 MG/2ML IJ SOLN: 4.0000 mg | Freq: Four times a day (QID) | INTRAMUSCULAR | Status: DC | PRN

## 2014-02-17 MED ORDER — FLUDROCORTISONE ACETATE 0.1 MG PO TABS
0.1000 mg | ORAL_TABLET | Freq: Every day | ORAL | Status: DC
  Filled 2014-02-17: qty 1

## 2014-02-17 MED ORDER — ASPIRIN 81 MG PO CHEW
162.0000 mg | CHEWABLE_TABLET | Freq: Once | ORAL | Status: AC
Start: 2014-02-17 — End: 2014-02-17
  Administered 2014-02-17: 162 mg via ORAL
  Filled 2014-02-17: qty 2

## 2014-02-17 MED ORDER — ENOXAPARIN SODIUM 40 MG/0.4ML ~~LOC~~ SOLN
40.0000 mg | SUBCUTANEOUS | Status: DC
  Administered 2014-02-17: 40 mg via SUBCUTANEOUS
  Filled 2014-02-17 (×2): qty 0.4

## 2014-02-17 MED ORDER — ASPIRIN EC 81 MG PO TBEC
81.0000 mg | DELAYED_RELEASE_TABLET | Freq: Every day | ORAL | Status: DC
  Administered 2014-02-17: 81 mg via ORAL
  Filled 2014-02-17 (×2): qty 1

## 2014-02-17 MED ORDER — ACETAMINOPHEN 325 MG PO TABS
650.0000 mg | ORAL_TABLET | ORAL | Status: DC | PRN
  Administered 2014-02-17: 650 mg via ORAL
  Filled 2014-02-17: qty 2

## 2014-02-17 NOTE — H&P (Signed)
History and Physical  Leslie Petersen VWU:[id number] DOB: 05-Jul-1934 DOA: 02/17/2014  Referring physician: Varney Biles, ER Physician PCP: Tawanna Solo, MD   Chief Complaint: Atypical chest and arm pain  HPI: Leslie Petersen is a 79 y.o. female  Past medical history of lung cancer status post lobe resection and hypertension who was in her usual state of health and then 2 days prior started having very atypical pain which he described as an aching sensation occurring medial to her axilla bilaterally and on the left side radiating to her back. She also felt some pain in her left upper arm and left forearm. Initially she noted that the pain was getting worse to as high as an 8/10. She came to the emergency room for further evaluation. She denies any shortness of breath, pain elsewhere, nausea vomiting or dizziness. An EKG done was unremarkable. Initial set of enzymes were negative. Patient had no oxygen desaturations and a chest x-ray was unremarkable. Hospitalists were called for further evaluation.  Patient received one dose of nitroglycerin which decreased her sensation down to a 2/10   Review of Systems:  Patient seen in emergency room. Pt complains of mild chest and arm pain, much less than when she came in, located at the same place as a meal bilaterally to her axilla as well as on her left arm and forearm.  Pt denies any shortness of breath, wheeze, cough, abdominal pain, hematuria, dysuria, constipation, diarrhea, focal extremity numbness or weakness or pain other than described above.  Review of systems are otherwise negative  Past Medical History  Diagnosis Date  . Allergy     seasonal  . Lung cancer, upper lobe, left 2011    adenocarcinoma resected   . Cataract   . Glaucoma   . Osteoporosis   . Adenomatous colon polyp   . 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 Cancer     lung   Past Surgical History  Procedure Laterality Date  . Lung cancer surgery  08/2009  .  Adrenal gland surgery  1990/2011  . Colonoscopy  multiple  . Malignant tumor from back  June 2015    at [facility] "related to adrenal gland cancer" per pt   Social History:  reports that she quit smoking about 25 years ago. She has never used smokeless tobacco. She reports that she does not drink alcohol or use illicit drugs. Patient lives at home & is able to participate in activities of daily living with use of a cane. Daughter lives nearby  Allergies  Allergen Reactions  . Mirabegron Other (See Comments)    Patient had adverse effect to medication (patient does not want to take any overactive bladder medications).    Family history: Hypertension. Confirmed with patient  Prior to Admission medications   Medication Sig Start Date End Date Taking? Authorizing Provider  alendronate (FOSAMAX) 70 MG tablet Take 70 mg by mouth once a week. Takes on Sunday's 08/15/12  Yes Historical Provider, MD  aspirin EC 81 MG tablet Take 81 mg by mouth at bedtime.   Yes Historical Provider, MD  Calcium Carbonate-Vit D-Min (CALCIUM 1200 PO) Take 1,200 mg by mouth daily.   Yes Historical Provider, MD  cholecalciferol (VITAMIN D) 1000 UNITS tablet Take 1,000 Units by mouth daily.   Yes Historical Provider, MD  docusate sodium (COLACE) 100 MG capsule Take 100-200 mg by mouth daily as needed for mild constipation.   Yes Historical Provider, MD  fish oil-omega-3 fatty acids 1000  MG capsule Take 1 g by mouth daily.   Yes Historical Provider, MD  fludrocortisone (FLORINEF) 0.1 MG tablet Take 0.1 mg by mouth daily at 12 noon.  09/12/11  Yes Historical Provider, MD  hydrocortisone (CORTEF) 10 MG tablet Take 10 mg by mouth daily with breakfast.  09/12/11  Yes Historical Provider, MD  Multiple Vitamins-Minerals (MULTIVITAMIN WITH MINERALS) tablet Take 1 tablet by mouth daily.   Yes Historical Provider, MD  polycarbophil (FIBERCON) 625 MG tablet Take 1,250 mg by mouth daily as needed for mild constipation.    Yes Historical  Provider, MD  pseudoephedrine (SINUS & ALLERGY 12 HOUR) 120 MG 12 hr tablet Take 120 mg by mouth daily as needed for congestion.   Yes Historical Provider, MD  TRAVATAN Z 0.004 % SOLN ophthalmic solution Place 1 drop into both eyes at bedtime.  09/12/11  Yes Historical Provider, MD    Physical Exam: BP 118/62  Pulse 79  Temp(Src) 98.6 F (37 C) (Oral)  Resp 32  SpO2 94%  General:  Alert and oriented x3, no acute distress Eyes: Sclera nonicteric, extraocular movements are intact ENT: Normocephalic, atraumatic, mucous membranes are moist Neck: No JVD Cardiovascular: Regular rate and rhythm, S1-S2, no murmurs Respiratory: Clear to auscultation bilaterally Abdomen: Soft, nontender, nondistended, positive bowel sounds Skin: No skin breaks, tears or lesions Musculoskeletal: No clubbing or cyanosis or edema Psychiatric: Patient is appropriate, no evidence of psychoses Neurologic: No focal deficits           Labs on Admission:  Basic Metabolic Panel:  Recent Labs Lab 02/17/14 1316  NA 137  K 3.7  CL 102  CO2 23  GLUCOSE 115*  BUN 17  CREATININE 0.60  CALCIUM 9.4   Liver Function Tests: No results found for this basename: AST, ALT, ALKPHOS, BILITOT, PROT, ALBUMIN,  in the last 168 hours No results found for this basename: LIPASE, AMYLASE,  in the last 168 hours No results found for this basename: AMMONIA,  in the last 168 hours CBC:  Recent Labs Lab 02/17/14 1316  WBC 5.9  HGB 13.3  HCT 39.9  MCV 96.6  PLT 219   Cardiac Enzymes: No results found for this basename: CKTOTAL, CKMB, CKMBINDEX, TROPONINI,  in the last 168 hours  BNP (last 3 results) No results found for this basename: PROBNP,  in the last 8760 hours CBG: No results found for this basename: GLUCAP,  in the last 168 hours  Radiological Exams on Admission: Dg Chest Port 1 View  02/17/2014     IMPRESSION: No acute disease.  Postoperative change left upper lobe.  Emphysema.  Hiatal hernia.    Electronically Signed   By: Inge Rise M.D.   On: 02/17/2014 13:57    EKG: Independently reviewed. Sinus rhythm with mild left atrial enlargement  Assessment/Plan Present on Admission:  . Osteoporosis: Holding vitamin D  . Atypical chest pain:, Not very suspicious for PE. Patient is not tachycardic or hypoxic. She is not complaining of shortness of breath. We'll check enzymes x3. If there is no change, likely can be discharged tomorrow.  . Hypotension: Continue fioricet  Consultants: None  Code Status: Full code  Family Communication: Left message with daughter, she's not present at this time   Disposition Plan: If enzymes are negative and patient is feeling okay, likely can go home tomorrow  Time spent: 25 minutes  [facility] Pager [phone]  **Disclaimer: This note may have been dictated with voice recognition software. Similar sounding words  can inadvertently be transcribed and this note may contain transcription errors which may not have been corrected upon publication of note.**

## 2014-02-17 NOTE — ED Notes (Signed)
Pt c/o left shoulder, left arm and lateral chest pain x 2days.  Pt had caner before and similar symptoms of when she was diagnosed with lung cancer.

## 2014-02-17 NOTE — ED Notes (Signed)
Advised to give Nitrostat for left shoulder pain per MD. Patient reports alleviated pain with one Nitrostat tablet. Will continue to reassess. Hospitalist at bedside.

## 2014-02-17 NOTE — ED Notes (Signed)
Initial contact-A&Ox4. Ambulatory and moving all extremities equally. Denies dizziness, SOB. Hooked up to 12 Lead EKG for continuous monitoring. C/o chest pain radiating to left shoulder and back. States "I've had lung CA before and this was exactly what it felt like so this makes me nervous." No other complaints/concerns. Awaiting MD/PA.

## 2014-02-17 NOTE — ED Provider Notes (Signed)
CSN: [id number]     Arrival date & time 02/17/14  1300 History   First MD Initiated Contact with Patient 02/17/14 1331     Chief Complaint  Patient presents with  . Shoulder Pain  . left arm pain   . Chest Pain     (Consider location/radiation/quality/duration/timing/severity/associated sxs/prior Treatment) HPI Comments: Pt with left lateral chest pain and shoulder pain. Pain is pretty constant, and dull. There is no associated dib, nausea, diaphoresis. Pt is 29, with no cardiac hx, but she does have lung CA hx, s/p resection. No hx of PE, DVT. Last CT scan lungs was in June - there was no tumor.  Patient is a 79 y.o. female presenting with shoulder pain and chest pain. The history is provided by the patient.  Shoulder Pain This is a new problem. The current episode started 2 days ago. The problem occurs constantly. The problem has not changed since onset.Associated symptoms include chest pain. Pertinent negatives include no abdominal pain, no headaches and no shortness of breath. Nothing aggravates the symptoms. Nothing relieves the symptoms. She has tried nothing for the symptoms.  Chest Pain Associated symptoms: no abdominal pain, no headache, no nausea, no shortness of breath and not vomiting     Past Medical History  Diagnosis Date  . Allergy     seasonal  . Lung cancer, upper lobe, left 2011    adenocarcinoma resected   . Cataract   . Glaucoma   . Osteoporosis   . Adenomatous colon polyp   . 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 Cancer     lung   Past Surgical History  Procedure Laterality Date  . Lung cancer surgery  08/2009  . Adrenal gland surgery  1990/2011  . Colonoscopy  multiple  . Malignant tumor from back  June 2015    at [facility] "related to adrenal gland cancer" per pt   History reviewed. No pertinent family history. History  Substance Use Topics  . Smoking status: Former Smoker    Quit date: 02/17/1989  . Smokeless tobacco: Never Used  .  Alcohol Use: No   OB History   Grav Para Term Preterm Abortions TAB SAB Ect Mult Living                 Review of Systems  Constitutional: Negative for activity change.  Respiratory: Negative for shortness of breath.   Cardiovascular: Positive for chest pain.  Gastrointestinal: Negative for nausea, vomiting and abdominal pain.  Genitourinary: Negative for dysuria.  Musculoskeletal: Negative for neck pain.  Neurological: Negative for headaches.      Allergies  Mirabegron  Home Medications   Prior to Admission medications   Medication Sig Start Date End Date Taking? Authorizing Provider  alendronate (FOSAMAX) 70 MG tablet Take 70 mg by mouth once a week. Takes on Sunday's 08/15/12  Yes Historical Provider, MD  aspirin EC 81 MG tablet Take 81 mg by mouth at bedtime.   Yes Historical Provider, MD  Calcium Carbonate-Vit D-Min (CALCIUM 1200 PO) Take 1,200 mg by mouth daily.   Yes Historical Provider, MD  cholecalciferol (VITAMIN D) 1000 UNITS tablet Take 1,000 Units by mouth daily.   Yes Historical Provider, MD  docusate sodium (COLACE) 100 MG capsule Take 100-200 mg by mouth daily as needed for mild constipation.   Yes Historical Provider, MD  fish oil-omega-3 fatty acids 1000 MG capsule Take 1 g by mouth daily.   Yes Historical Provider, MD  fludrocortisone (  FLORINEF) 0.1 MG tablet Take 0.1 mg by mouth daily at 12 noon.  09/12/11  Yes Historical Provider, MD  hydrocortisone (CORTEF) 10 MG tablet Take 10 mg by mouth daily with breakfast.  09/12/11  Yes Historical Provider, MD  Multiple Vitamins-Minerals (MULTIVITAMIN WITH MINERALS) tablet Take 1 tablet by mouth daily.   Yes Historical Provider, MD  polycarbophil (FIBERCON) 625 MG tablet Take 1,250 mg by mouth daily as needed for mild constipation.    Yes Historical Provider, MD  pseudoephedrine (SINUS & ALLERGY 12 HOUR) 120 MG 12 hr tablet Take 120 mg by mouth daily as needed for congestion.   Yes Historical Provider, MD  TRAVATAN Z 0.004  % SOLN ophthalmic solution Place 1 drop into both eyes at bedtime.  09/12/11  Yes Historical Provider, MD   BP 156/79  Pulse 78  Temp(Src) 98.6 F (37 C) (Oral)  Resp 30  SpO2 96% Physical Exam  Nursing note and vitals reviewed. Constitutional: She is oriented to person, place, and time. She appears well-developed and well-nourished.  HENT:  Head: Normocephalic and atraumatic.  Eyes: EOM are normal. Pupils are equal, round, and reactive to light.  Neck: Neck supple.  Cardiovascular: Normal rate, regular rhythm and normal heart sounds.   No murmur heard. Pulmonary/Chest: Effort normal. No respiratory distress.  Abdominal: Soft. She exhibits no distension. There is no tenderness. There is no rebound and no guarding.  Musculoskeletal:  Left shoulder pain is not reproducible.  Neurological: She is alert and oriented to person, place, and time.  Skin: Skin is warm and dry.    ED Course  Procedures (including critical care time) Labs Review Labs Reviewed  BASIC METABOLIC PANEL - Abnormal; Notable for the following:    Glucose, Bld 115 (*)    GFR calc non Af Amer 86 (*)    All other components within normal limits  CBC  I-STAT TROPOININ, ED    Imaging Review Dg Chest Port 1 View  02/17/2014   CLINICAL DATA:  Chest pain and shortness of breath. History of lung cancer.  EXAM: PORTABLE CHEST - 1 VIEW  COMPARISON:  PET CT scan 10/13/2013. Single view of the chest 09/13/2013.  FINDINGS: Volume loss in the left chest and suture material in the left upper lobe are again seen. The chest is hyperexpanded but the lungs are clear. Hiatal hernia is noted. No pneumothorax or pleural effusion is identified.  IMPRESSION: No acute disease.  Postoperative change left upper lobe.  Emphysema.  Hiatal hernia.   Electronically Signed   By: Inge Rise M.D.   On: 02/17/2014 13:57     EKG Interpretation   Date/Time:  Thursday February 17 2014 13:05:52 EDT Ventricular Rate:  88 PR Interval:   148 QRS Duration: 75 QT Interval:  363 QTC Calculation: 439 R Axis:   71 Text Interpretation:  Sinus rhythm Probable left atrial enlargement  Unchanged from previous Confirmed by Kathrynn Humble, MD, Thelma Comp [phone]) on  02/17/2014 1:32:20 PM      MDM   Final diagnoses:  Atypical chest pain    Differential diagnosis includes: ACS syndrome CHF exacerbation Valvular disorder Myocarditis Pericarditis Pericardial effusion Pneumonia Pleural effusion Pulmonary edema PE Anemia Musculoskeletal pain  Pt comes in with cc of chest pain. She has atypical pain, in the chest and left arm. Pain is not musculoskeletal. There is no dib, no pleuritic component to the pain. EKG is reassuring and unchanged. No concerns for PE.  Will admit for ACS r/o.  Varney Biles, MD 02/17/14  1608 

## 2014-02-17 NOTE — ED Notes (Signed)
RN from 4E will call back promptly for report.

## 2014-02-18 DIAGNOSIS — R079 Chest pain, unspecified: Secondary | ICD-10-CM

## 2014-02-18 DIAGNOSIS — Z86010 Personal history of colonic polyps: Secondary | ICD-10-CM

## 2014-02-18 DIAGNOSIS — C7490 Malignant neoplasm of unspecified part of unspecified adrenal gland: Secondary | ICD-10-CM

## 2014-02-18 LAB — TROPONIN I
Troponin I: <0.3 ng/mL (ref <0.30)
Troponin I: <0.3 ng/mL (ref <0.30)

## 2014-02-18 NOTE — Discharge Instructions (Addendum)
Chest Pain (Nonspecific) °It is often hard to give a specific diagnosis for the cause of chest pain. There is always a chance that your pain could be related to something serious, such as a heart attack or a blood clot in the lungs. You need to follow up with your health care provider for further evaluation. °CAUSES  °· Heartburn. °· Pneumonia or bronchitis. °· Anxiety or stress. °· Inflammation around your heart (pericarditis) or lung (pleuritis or pleurisy). °· A blood clot in the lung. °· A collapsed lung (pneumothorax). It can develop suddenly on its own (spontaneous pneumothorax) or from trauma to the chest. °· Shingles infection (herpes zoster virus). °The chest wall is composed of bones, muscles, and cartilage. Any of these can be the source of the pain. °· The bones can be bruised by injury. °· The muscles or cartilage can be strained by coughing or overwork. °· The cartilage can be affected by inflammation and become sore (costochondritis). °DIAGNOSIS  °Lab tests or other studies may be needed to find the cause of your pain. Your health care provider may have you take a test called an ambulatory electrocardiogram (ECG). An ECG records your heartbeat patterns over a 24-hour period. You may also have other tests, such as: °· Transthoracic echocardiogram (TTE). During echocardiography, sound waves are used to evaluate how blood flows through your heart. °· Transesophageal echocardiogram (TEE). °· Cardiac monitoring. This allows your health care provider to monitor your heart rate and rhythm in real time. °· Holter monitor. This is a portable device that records your heartbeat and can help diagnose heart arrhythmias. It allows your health care provider to track your heart activity for several days, if needed. °· Stress tests by exercise or by giving medicine that makes the heart beat faster. °TREATMENT  °· Treatment depends on what may be causing your chest pain. Treatment may include: °¨ Acid blockers for  heartburn. °¨ Anti-inflammatory medicine. °¨ Pain medicine for inflammatory conditions. °¨ Antibiotics if an infection is present. °· You may be advised to change lifestyle habits. This includes stopping smoking and avoiding alcohol, caffeine, and chocolate. °· You may be advised to keep your head raised (elevated) when sleeping. This reduces the chance of acid going backward from your stomach into your esophagus. °Most of the time, nonspecific chest pain will improve within 2-3 days with rest and mild pain medicine.  °HOME CARE INSTRUCTIONS  °· If antibiotics were prescribed, take them as directed. Finish them even if you start to feel better. °· For the next few days, avoid physical activities that bring on chest pain. Continue physical activities as directed. °· Do not use any tobacco products, including cigarettes, chewing tobacco, or electronic cigarettes. °· Avoid drinking alcohol. °· Only take medicine as directed by your health care provider. °· Follow your health care provider's suggestions for further testing if your chest pain does not go away. °· Keep any follow-up appointments you made. If you do not go to an appointment, you could develop lasting (chronic) problems with pain. If there is any problem keeping an appointment, call to reschedule. °SEEK MEDICAL CARE IF:  °· Your chest pain does not go away, even after treatment. °· You have a rash with blisters on your chest. °· You have a fever. °SEEK IMMEDIATE MEDICAL CARE IF:  °· You have increased chest pain or pain that spreads to your arm, neck, jaw, back, or abdomen. °· You have shortness of breath. °· You have an increasing cough, or you cough   up blood.  You have severe back or abdominal pain.  You feel nauseous or vomit.  You have severe weakness.  You faint.  You have chills. This is an emergency. Do not wait to see if the pain will go away. Get medical help at once. Call your local emergency services (911 in [place] Do not drive  yourself to the hospital. MAKE SURE YOU:   Understand these instructions.  Will watch your condition.  Will get help right away if you are not doing well or get worse. Document Released: 03/13/2005 Document Revised: 06/08/2013 Document Reviewed: 01/07/2008 [facility] Patient Information 2015 [place], [place]. This information is not intended to replace advice given to you by your health care provider. Make sure you discuss any questions you have with your health care provider.  Chest Pain Observation It is often hard to give a specific diagnosis for the cause of chest pain. Among other possibilities your symptoms might be caused by inadequate oxygen delivery to your heart (angina). Angina that is not treated or evaluated can lead to a heart attack (myocardial infarction) or death. Blood tests, electrocardiograms, and X-rays may have been done to help determine a possible cause of your chest pain. After evaluation and observation, your health care provider has determined that it is unlikely your pain was caused by an unstable condition that requires hospitalization. However, a full evaluation of your pain may need to be completed, with additional diagnostic testing as directed. It is very important to keep your follow-up appointments. Not keeping your follow-up appointments could result in permanent heart damage, disability, or death. If there is any problem keeping your follow-up appointments, you must call your health care provider. HOME CARE INSTRUCTIONS  Due to the slight chance that your pain could be angina, it is important to follow your health care provider's treatment plan and also maintain a healthy lifestyle:  Maintain or work toward achieving a healthy weight.  Stay physically active and exercise regularly.  Decrease your salt intake.  Eat a balanced, healthy diet. Talk to a dietitian to learn about heart-healthy foods.  Increase your fiber intake by including whole grains,  vegetables, fruits, and nuts in your diet.  Avoid situations that cause stress, anger, or depression.  Take medicines as advised by your health care provider. Report any side effects to your health care provider. Do not stop medicines or adjust the dosages on your own.  Quit smoking. Do not use nicotine patches or gum until you check with your health care provider.  Keep your blood pressure, blood sugar, and cholesterol levels within normal limits.  Limit alcohol intake to no more than 1 drink per day for women who are not pregnant and 2 drinks per day for men.  Do not abuse drugs. SEEK IMMEDIATE MEDICAL CARE IF: You have severe chest pain or pressure which may include symptoms such as:  You feel pain or pressure in your arms, neck, jaw, or back.  You have severe back or abdominal pain, feel sick to your stomach (nauseous), or throw up (vomit).  You are sweating profusely.  You are having a fast or irregular heartbeat.  You feel short of breath while at rest.  You notice increasing shortness of breath during rest, sleep, or with activity.  You have chest pain that does not get better after rest or after taking your usual medicine.  You wake from sleep with chest pain.  You are unable to sleep because you cannot breathe.  You develop a frequent  cough or you are coughing up blood.  You feel dizzy, faint, or experience extreme fatigue.  You develop severe weakness, dizziness, fainting, or chills. Any of these symptoms may represent a serious problem that is an emergency. Do not wait to see if the symptoms will go away. Call your local emergency services (911 in the [place] Do not drive yourself to the hospital. MAKE SURE YOU:  Understand these instructions.  Will watch your condition.  Will get help right away if you are not doing well or get worse. Document Released: 07/06/2010 Document Revised: 06/08/2013 Document Reviewed: 12/03/2012 [facility] Patient Information 2015  [place], [place]. This information is not intended to replace advice given to you by your health care provider. Make sure you discuss any questions you have with your health care provider.

## 2014-02-18 NOTE — Progress Notes (Signed)
UR Completed.  Vergie Living [phone] 02/18/2014

## 2014-02-18 NOTE — Discharge Summary (Signed)
Physician Discharge Summary  [name] YTK:[id number] DOB: [birth year] DOA: 02/17/2014  PCP: Tawanna Solo, MD  Admit date: 02/17/2014 Discharge date: 02/18/2014  Time spent: 40 minutes  Recommendations for Outpatient Follow-up:  1. Followup with primary care physician within one week.  Discharge Diagnoses:  Principal Problem:   Atypical chest pain Active Problems:   Osteoporosis   Hypotension   Chest pain   Discharge Condition: Stable  Diet recommendation: Heart healthy  Filed Weights   02/17/14 2019  Weight: 55.293 kg (121 lb 14.4 oz)    History of present illness:  Leslie Petersen is a 79 y.o. female  Past medical history of lung cancer status post lobe resection and hypertension who was in her usual state of health and then 2 days prior started having very atypical pain which he described as an aching sensation occurring medial to her axilla bilaterally and on the left side radiating to her back. She also felt some pain in her left upper arm and left forearm. Initially she noted that the pain was getting worse to as high as an 8/10. She came to the emergency room for further evaluation. She denies any shortness of breath, pain elsewhere, nausea vomiting or dizziness. An EKG done was unremarkable. Initial set of enzymes were negative. Patient had no oxygen desaturations and a chest x-ray was unremarkable. Hospitalists were called for further evaluation. Patient received one dose of nitroglycerin which decreased her sensation down to a 2/10  Hospital Course:   Chest pain, atypical -Patient presented to the hospital with noncardiac chest pain. -Pain involves left shoulder plate and left arm, constant for the past 2 days and does not improve with rest or nitroglycerin. -Chest x-ray did not show any abnormalities. -Unlikely to be PE because of no pleuritic chest pain, no tachycardia or hypoxia. -ACS ruled out by 3 negative sets of cardiac enzymes and unremarkable EKG. -Patient  discharged to followup with her primary care physician, consider outpatient stress test if symptoms recur. -This is likely muscular pain patient instructed to take OTC Tylenol for the pain.  History of lung cancer -Status post left-sided wedge resection, stable.  History of adrenocortical cancer -Status post bilateral adrenalectomy, patient is on fludrocortisone and hydrocortisone. -Continue home medications, no evidence of hypotension. -She had recent resection of paraspinous mass done on 12/03/2013 at [facility]. -Pathology showed recurrent adrenal cortical carcinoma, clear margins, patient follows with Dr. Benay Spice as outpatient.  Emphysema -Evidence of centrilobular emphysema on x-rays, no decompensation, no shortness of breath or wheezing.  Procedures:  None  Consultations:  None  Discharge Exam: Filed Vitals:   02/18/14 0452  BP: 134/87  Pulse: 72  Temp: 97.8 F (36.6 C)  Resp: 24   General: Alert and awake, oriented x3, not in any acute distress. HEENT: anicteric sclera, pupils reactive to light and accommodation, EOMI CVS: S1-S2 clear, no murmur rubs or gallops Chest: clear to auscultation bilaterally, no wheezing, rales or rhonchi Abdomen: soft nontender, nondistended, normal bowel sounds, no organomegaly Extremities: no cyanosis, clubbing or edema noted bilaterally Neuro: Cranial nerves II-XII intact, no focal neurological deficits  Discharge Instructions You were cared for by a hospitalist during your hospital stay. If you have any questions about your discharge medications or the care you received while you were in the hospital after you are discharged, you can call the unit and asked to speak with the hospitalist on call if the hospitalist that took care of you is not available. Once you are discharged, your primary care  physician will handle any further medical issues. Please note that NO REFILLS for any discharge medications will be authorized once you are  discharged, as it is imperative that you return to your primary care physician (or establish a relationship with a primary care physician if you do not have one) for your aftercare needs so that they can reassess your need for medications and monitor your lab values.  Discharge Instructions   Diet - low sodium heart healthy    Complete by:  As directed      Increase activity slowly    Complete by:  As directed           Current Discharge Medication List    CONTINUE these medications which have NOT CHANGED   Details  alendronate (FOSAMAX) 70 MG tablet Take 70 mg by mouth once a week. Takes on Sunday's   Associated Diagnoses: Malignant neoplasm of bronchus and lung, unspecified site; Adrenal cancer, unspecified laterality    aspirin EC 81 MG tablet Take 81 mg by mouth at bedtime.    Calcium Carbonate-Vit D-Min (CALCIUM 1200 PO) Take 1,200 mg by mouth daily.    cholecalciferol (VITAMIN D) 1000 UNITS tablet Take 1,000 Units by mouth daily.    docusate sodium (COLACE) 100 MG capsule Take 100-200 mg by mouth daily as needed for mild constipation.    fish oil-omega-3 fatty acids 1000 MG capsule Take 1 g by mouth daily.    fludrocortisone (FLORINEF) 0.1 MG tablet Take 0.1 mg by mouth daily at 12 noon.     hydrocortisone (CORTEF) 10 MG tablet Take 10 mg by mouth daily with breakfast.     Multiple Vitamins-Minerals (MULTIVITAMIN WITH MINERALS) tablet Take 1 tablet by mouth daily.    polycarbophil (FIBERCON) 625 MG tablet Take 1,250 mg by mouth daily as needed for mild constipation.    Associated Diagnoses: Malignant neoplasm of bronchus and lung, unspecified site; Adrenal cancer, unspecified laterality    pseudoephedrine (SINUS & ALLERGY 12 HOUR) 120 MG 12 hr tablet Take 120 mg by mouth daily as needed for congestion.    TRAVATAN Z 0.004 % SOLN ophthalmic solution Place 1 drop into both eyes at bedtime.        Allergies  Allergen Reactions  . Mirabegron Other (See Comments)     Patient had adverse effect to medication (patient does not want to take any overactive bladder medications).   Follow-up Information   Follow up with Tawanna Solo, MD In 1 week.   Specialty:  Family Medicine   Contact information:   [place] [address] [address] [phone]        The results of significant diagnostics from this hospitalization (including imaging, microbiology, ancillary and laboratory) are listed below for reference.    Significant Diagnostic Studies: Dg Chest Port 1 View  02/17/2014   CLINICAL DATA:  Chest pain and shortness of breath. History of lung cancer.  EXAM: PORTABLE CHEST - 1 VIEW  COMPARISON:  PET CT scan 10/13/2013. Single view of the chest 09/13/2013.  FINDINGS: Volume loss in the left chest and suture material in the left upper lobe are again seen. The chest is hyperexpanded but the lungs are clear. Hiatal hernia is noted. No pneumothorax or pleural effusion is identified.  IMPRESSION: No acute disease.  Postoperative change left upper lobe.  Emphysema.  Hiatal hernia.   Electronically Signed   By: Inge Rise M.D.   On: 02/17/2014 13:57    Microbiology: No results found for this or  any previous visit (from the past 240 hour(s)).   Labs: Basic Metabolic Panel:  Recent Labs Lab 02/17/14 1316  NA 137  K 3.7  CL 102  CO2 23  GLUCOSE 115*  BUN 17  CREATININE 0.60  CALCIUM 9.4   Liver Function Tests: No results found for this basename: AST, ALT, ALKPHOS, BILITOT, PROT, ALBUMIN,  in the last 168 hours No results found for this basename: LIPASE, AMYLASE,  in the last 168 hours No results found for this basename: AMMONIA,  in the last 168 hours CBC:  Recent Labs Lab 02/17/14 1316  WBC 5.9  HGB 13.3  HCT 39.9  MCV 96.6  PLT 219   Cardiac Enzymes:  Recent Labs Lab 02/17/14 2046 02/18/14 0220 02/18/14 0835  TROPONINI <0.30 <0.30 <0.30   BNP: BNP (last 3 results) No results found for this basename: PROBNP,  in the  last 8760 hours CBG: No results found for this basename: GLUCAP,  in the last 168 hours     Signed:  Ayelen Sciortino A  [facility] 02/18/2014, 11:42 AM

## 2014-02-18 NOTE — Care Management Note (Signed)
    Page 1 of 1   02/18/2014     12:57:59 PM CARE MANAGEMENT NOTE 02/18/2014  Patient:  Leslie Petersen, Leslie Petersen   Account Number:  [id number]  Date Initiated:  02/18/2014  Documentation initiated by:  Dessa Phi  Subjective/Objective Assessment:   79 Y/O F ADMITTED W/CHEST PAIN.     Action/Plan:   FROM HOME.   Anticipated DC Date:  02/18/2014   Anticipated DC Plan:  [place]  CM consult      Choice offered to / List presented to:             Status of service:  Completed, signed off Medicare Important Message given?   (If response is "NO", the following Medicare IM given date fields will be blank) Date Medicare IM given:   Medicare IM given by:   Date Additional Medicare IM given:   Additional Medicare IM given by:    Discharge Disposition:  HOME/SELF CARE  Per UR Regulation:  Reviewed for med. necessity/level of care/duration of stay  If discussed at [place] of Stay Meetings, dates discussed:    Comments:  02/18/14 Malayla Granberry RN,BSN NCM [phone] D/C HOME NO [place].

## 2014-04-18 ENCOUNTER — Ambulatory Visit (HOSPITAL_BASED_OUTPATIENT_CLINIC_OR_DEPARTMENT_OTHER): Payer: Medicare Other | Admitting: Oncology

## 2014-04-18 ENCOUNTER — Telehealth: Payer: Self-pay | Admitting: Oncology

## 2014-04-18 VITALS — BP 154/77 | HR 84 | Temp 98.2°F | Resp 18 | Ht 66.0 in | Wt 125.0 lb

## 2014-04-18 DIAGNOSIS — C7400 Malignant neoplasm of cortex of unspecified adrenal gland: Secondary | ICD-10-CM

## 2014-04-18 DIAGNOSIS — C7401 Malignant neoplasm of cortex of right adrenal gland: Secondary | ICD-10-CM

## 2014-04-18 DIAGNOSIS — C7490 Malignant neoplasm of unspecified part of unspecified adrenal gland: Principal | ICD-10-CM

## 2014-04-18 NOTE — Telephone Encounter (Signed)
gv adn printed appt sched and avs for pt for March 2016

## 2014-04-18 NOTE — Progress Notes (Signed)
  [facility] OFFICE PROGRESS NOTE   Diagnosis: Adrenocortical carcinoma  INTERVAL HISTORY:   Ms. Lynk returns as scheduled. She feels well. Good appetite. No pain. She was admittedwith chest pain in September.-negative chest x-ray and cardiac evaluation.  Objective:  Vital signs in last 24 hours:  Blood pressure 154/77, pulse 84, temperature 98.2 F (36.8 C), temperature source Oral, resp. rate 18, height 5\' 6"  (1.676 m), weight 125 lb (56.7 kg).    HEENT: neck without mass Lymphatics: no cervical, supraclavicular, axillary, or inguinal nodes Resp: bronchial sounds throughout the left chest, no respiratory distress Cardio: regular rate and rhythm GI: no hepatomegaly, nontender, no mass Vascular: no leg edema Musculoskeletal: Left lower back scar without evidence of recurrent tumor   Medications: I have reviewed the patient's current medications.  Assessment/Plan: 1. 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 2. Left upper lung adenocarcinoma (T1 N0), status post a wedge resection, 08/15/2009. 3. Left adrenocortical carcinoma, status post a left adrenalectomy in 1994. 4. CT of the chest and abdomen, 09/04/2012. No evidence of tumor recurrence or metastatic disease in the chest or abdomen. 5. Soft tissue nodule noted on the CT 09/04/2012, posterior to the left sacrum-present on a PET/CT 07/06/2009 without hypermetabolic activity. Larger on the pelvic CT 09/04/2013, the lesion had associated enhancement on an MRI 09/16/2013  Biopsy of the left paraspinous mass 10/01/2013 confirmed metastatic carcinoma consistent with metastatic adrenocortical carcinoma  Excision of the left paraspinous mass at [facility] on 12/03/2013 with the pathology confirming metastatic adrenocortical carcinoma with negative surgical margins 6. Enterococcus urinary tract infection 09/04/2013 7. Circular area of erythema at the left lower back-? Yeast  rash   Disposition:  She remains in clinical remission from metastatic adrenocortical carcinoma. She will return for an office visit and restaging CT evaluation in approximately 4 months.  Betsy Coder, MD  04/18/2014  9:25 AM

## 2014-06-12 ENCOUNTER — Emergency Department (HOSPITAL_COMMUNITY)
Admission: EM | Admit: 2014-06-12 | Discharge: 2014-06-12 | Disposition: A | Discharge status: Home / Self Care | Payer: Medicare Other | Attending: Emergency Medicine | Admitting: Emergency Medicine

## 2014-06-12 ENCOUNTER — Encounter (HOSPITAL_COMMUNITY): Payer: Self-pay | Admitting: *Deleted

## 2014-06-12 ENCOUNTER — Emergency Department (HOSPITAL_COMMUNITY): Payer: Medicare Other

## 2014-06-12 DIAGNOSIS — K209 Esophagitis, unspecified without bleeding: Secondary | ICD-10-CM

## 2014-06-12 DIAGNOSIS — Z7982 Long term (current) use of aspirin: Secondary | ICD-10-CM | POA: Diagnosis not present

## 2014-06-12 DIAGNOSIS — Z87891 Personal history of nicotine dependence: Secondary | ICD-10-CM | POA: Insufficient documentation

## 2014-06-12 DIAGNOSIS — M810 Age-related osteoporosis without current pathological fracture: Secondary | ICD-10-CM | POA: Diagnosis not present

## 2014-06-12 DIAGNOSIS — Z7952 Long term (current) use of systemic steroids: Secondary | ICD-10-CM | POA: Insufficient documentation

## 2014-06-12 DIAGNOSIS — R911 Solitary pulmonary nodule: Secondary | ICD-10-CM | POA: Diagnosis not present

## 2014-06-12 DIAGNOSIS — Z86010 Personal history of colonic polyps: Secondary | ICD-10-CM | POA: Diagnosis not present

## 2014-06-12 DIAGNOSIS — Z85118 Personal history of other malignant neoplasm of bronchus and lung: Secondary | ICD-10-CM | POA: Diagnosis not present

## 2014-06-12 DIAGNOSIS — Z79899 Other long term (current) drug therapy: Secondary | ICD-10-CM | POA: Diagnosis not present

## 2014-06-12 DIAGNOSIS — H409 Unspecified glaucoma: Secondary | ICD-10-CM | POA: Diagnosis not present

## 2014-06-12 DIAGNOSIS — R1310 Dysphagia, unspecified: Secondary | ICD-10-CM | POA: Diagnosis present

## 2014-06-12 LAB — URINALYSIS, ROUTINE W REFLEX MICROSCOPIC
Bilirubin Urine: NEGATIVE
Glucose, UA: NEGATIVE mg/dL
Ketones, ur: 15 mg/dL — AB
Leukocytes, UA: NEGATIVE
Nitrite: NEGATIVE
PROTEIN: NEGATIVE mg/dL
Specific Gravity, Urine: 1.022 (ref 1.005–1.030)
UROBILINOGEN UA: 1 mg/dL (ref 0.0–1.0)
pH: 7 (ref 5.0–8.0)

## 2014-06-12 LAB — CBC WITH DIFFERENTIAL/PLATELET
BASOS PCT: 1 % (ref 0–1)
Basophils Absolute: 0 10*3/uL (ref 0.0–0.1)
EOS ABS: 0 10*3/uL (ref 0.0–0.7)
Eosinophils Relative: 1 % (ref 0–5)
HCT: 40.9 % (ref 36.0–46.0)
Hemoglobin: 14 g/dL (ref 12.0–15.0)
Lymphocytes Relative: 18 % (ref 12–46)
Lymphs Abs: 1.4 10*3/uL (ref 0.7–4.0)
MCH: 33.3 pg (ref 26.0–34.0)
MCHC: 34.2 g/dL (ref 30.0–36.0)
MCV: 97.1 fL (ref 78.0–100.0)
MONOS PCT: 6 % (ref 3–12)
Monocytes Absolute: 0.5 10*3/uL (ref 0.1–1.0)
NEUTROS PCT: 74 % (ref 43–77)
Neutro Abs: 5.5 10*3/uL (ref 1.7–7.7)
Platelets: 221 10*3/uL (ref 150–400)
RBC: 4.21 MIL/uL (ref 3.87–5.11)
RDW: 13.3 % (ref 11.5–15.5)
WBC: 7.4 10*3/uL (ref 4.0–10.5)

## 2014-06-12 LAB — I-STAT CHEM 8, ED
BUN: 16 mg/dL (ref 6–23)
Calcium, Ion: 0.96 mmol/L — ABNORMAL LOW (ref 1.13–1.30)
Chloride: 107 mEq/L (ref 96–112)
Creatinine, Ser: 0.5 mg/dL (ref 0.50–1.10)
Glucose, Bld: 88 mg/dL (ref 70–99)
HEMATOCRIT: 43 % (ref 36.0–46.0)
Hemoglobin: 14.6 g/dL (ref 12.0–15.0)
Potassium: 3.5 mmol/L (ref 3.5–5.1)
SODIUM: 139 mmol/L (ref 135–145)
TCO2: 21 mmol/L (ref 0–100)

## 2014-06-12 LAB — URINE MICROSCOPIC-ADD ON

## 2014-06-12 LAB — HEPATIC FUNCTION PANEL
ALK PHOS: 41 U/L (ref 39–117)
ALT: 13 U/L (ref 0–35)
AST: 20 U/L (ref 0–37)
Albumin: 3.4 g/dL — ABNORMAL LOW (ref 3.5–5.2)
BILIRUBIN DIRECT: 0.2 mg/dL (ref 0.0–0.3)
BILIRUBIN INDIRECT: 0.6 mg/dL (ref 0.3–0.9)
TOTAL PROTEIN: 6.6 g/dL (ref 6.0–8.3)
Total Bilirubin: 0.8 mg/dL (ref 0.3–1.2)

## 2014-06-12 LAB — LIPASE, BLOOD: Lipase: 24 U/L (ref 11–59)

## 2014-06-12 MED ORDER — GI COCKTAIL ~~LOC~~
30.0000 mL | Freq: Once | ORAL | Status: AC
  Administered 2014-06-12: 30 mL via ORAL
  Filled 2014-06-12: qty 30

## 2014-06-12 MED ORDER — IOHEXOL 300 MG/ML  SOLN
80.0000 mL | Freq: Once | INTRAMUSCULAR | Status: AC | PRN
  Administered 2014-06-12: 80 mL via INTRAVENOUS

## 2014-06-12 MED ORDER — PANTOPRAZOLE SODIUM 40 MG IV SOLR
40.0000 mg | Freq: Once | INTRAVENOUS | Status: AC
  Administered 2014-06-12: 40 mg via INTRAVENOUS
  Filled 2014-06-12: qty 40

## 2014-06-12 MED ORDER — PANTOPRAZOLE SODIUM 20 MG PO TBEC: 20.0000 mg | DELAYED_RELEASE_TABLET | Freq: Every day | ORAL | Status: DC

## 2014-06-12 NOTE — ED Notes (Signed)
Pt from home c/o severe epigastric pain only when she swallows food. Pt denies pain at this time. Pt denies N/V, fevers, UTI s/sx.  Pt is A&O and in NAD.

## 2014-06-12 NOTE — ED Provider Notes (Signed)
CSN: [id number]     Arrival date & time 06/12/14  0932 History   First MD Initiated Contact with Patient 06/12/14 1020     Chief Complaint  Patient presents with  . esophagus pain when swallowing      (Consider location/radiation/quality/duration/timing/severity/associated sxs/prior Treatment) HPI  Leslie Petersen is a 79 y.o. female who presents for evaluation of painful swallowing for 2 days.  This occurs both with fluids and solids but is worse with solids.  No prior similar symptoms.  No symptom of food impaction.  No fever, chills, nausea, vomiting, diarrhea, weakness or dizziness.  She is avoiding eating because of the discomfort.  She has not tried any medications for pain.  She does not have a history of heartburn.  She is being followed for cancer, but on most recent check, it has been controlled.  There are no other known modifying factors.    Past Medical History  Diagnosis Date  . Allergy     seasonal  . Lung cancer, upper lobe, left 2011    adenocarcinoma resected   . Cataract   . Glaucoma   . Osteoporosis   . Adenomatous colon polyp   . 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 Cancer     lung   Past Surgical History  Procedure Laterality Date  . Lung cancer surgery  08/2009  . Adrenal gland surgery  1990/2011  . Colonoscopy  multiple  . Malignant tumor from back  June 2015    at [facility] "related to adrenal gland cancer" per pt   History reviewed. No pertinent family history. History  Substance Use Topics  . Smoking status: Former Smoker    Quit date: 02/17/1989  . Smokeless tobacco: Never Used  . Alcohol Use: No   OB History    No data available     Review of Systems  All other systems reviewed and are negative.     Allergies  Mirabegron  Home Medications   Prior to Admission medications   Medication Sig Start Date End Date Taking? Authorizing Provider  alendronate (FOSAMAX) 70 MG tablet Take 70 mg by mouth once a week. Takes on  Sunday's 08/15/12  Yes Historical Provider, MD  aspirin EC 81 MG tablet Take 81 mg by mouth at bedtime.   Yes Historical Provider, MD  Calcium Carbonate-Vit D-Min (CALCIUM 1200 PO) Take 1,200 mg by mouth daily.   Yes Historical Provider, MD  cholecalciferol (VITAMIN D) 1000 UNITS tablet Take 1,000 Units by mouth daily.   Yes Historical Provider, MD  docusate sodium (COLACE) 100 MG capsule Take 100-200 mg by mouth daily as needed for mild constipation.   Yes Historical Provider, MD  fish oil-omega-3 fatty acids 1000 MG capsule Take 1 g by mouth daily.   Yes Historical Provider, MD  fludrocortisone (FLORINEF) 0.1 MG tablet Take 0.1 mg by mouth daily at 12 noon.  09/12/11  Yes Historical Provider, MD  hydrocortisone (CORTEF) 10 MG tablet Take 10 mg by mouth daily with breakfast.  09/12/11  Yes Historical Provider, MD  Meth-Hyo-M Bl-Na Phos-Ph Sal (URIBEL) 118 MG CAPS Take 1 capsule by mouth 2 (two) times daily.   Yes Historical Provider, MD  Multiple Vitamins-Minerals (MULTIVITAMIN WITH MINERALS) tablet Take 1 tablet by mouth daily.   Yes Historical Provider, MD  polycarbophil (FIBERCON) 625 MG tablet Take 1,250 mg by mouth daily as needed for mild constipation.    Yes Historical Provider, MD  polyethylene glycol (MIRALAX / GLYCOLAX) packet  Take 17 g by mouth daily as needed for mild constipation.   Yes Historical Provider, MD  pseudoephedrine (SINUS & ALLERGY 12 HOUR) 120 MG 12 hr tablet Take 120 mg by mouth daily as needed for congestion.   Yes Historical Provider, MD  TRAVATAN Z 0.004 % SOLN ophthalmic solution Place 1 drop into both eyes at bedtime.  09/12/11  Yes Historical Provider, MD  pantoprazole (PROTONIX) 20 MG tablet Take 1 tablet (20 mg total) by mouth daily. 06/12/14   Richarda Blade, MD   BP 124/63 mmHg  Pulse 89  Temp(Src) 98.3 F (36.8 C) (Oral)  Resp 16  SpO2 94% Physical Exam  Constitutional: She is oriented to person, place, and time. She appears well-developed.  Elderly, frail   HENT:  Head: Normocephalic and atraumatic.  Right Ear: External ear normal.  Left Ear: External ear normal.  Eyes: Conjunctivae and EOM are normal. Pupils are equal, round, and reactive to light.  Neck: Normal range of motion and phonation normal. Neck supple.  Cardiovascular: Normal rate, regular rhythm and normal heart sounds.   Pulmonary/Chest: Effort normal and breath sounds normal. She exhibits no bony tenderness.  Abdominal: Soft. There is no tenderness.  Musculoskeletal: Normal range of motion.  Neurological: She is alert and oriented to person, place, and time. No cranial nerve deficit or sensory deficit. She exhibits normal muscle tone. Coordination normal.  Skin: Skin is warm, dry and intact.  Psychiatric: She has a normal mood and affect. Her behavior is normal. Judgment and thought content normal.  Nursing note and vitals reviewed.   ED Course  Procedures (including critical care time)  Medications  gi cocktail (Maalox,Lidocaine,Donnatal) (30 mLs Oral Given 06/12/14 1122)  pantoprazole (PROTONIX) injection 40 mg (40 mg Intravenous Given 06/12/14 1136)  iohexol (OMNIPAQUE) 300 MG/ML solution 80 mL (80 mLs Intravenous Contrast Given 06/12/14 1219)    Patient Vitals for the past 24 hrs:  BP Temp Temp src Pulse Resp SpO2  06/12/14 1528 124/63 mmHg - - 89 16 94 %  06/12/14 0944 125/90 mmHg 98.3 F (36.8 C) Oral 120 18 97 %    At discharge- Reevaluation with update and discussion. After initial assessment and treatment, an updated evaluation reveals she is more comfortable after the GI cocktail.  Findings discussed with patient, all questions answered.Daleen Bo L    Labs Review Labs Reviewed  URINALYSIS, ROUTINE W REFLEX MICROSCOPIC - Abnormal; Notable for the following:    Color, Urine GREEN (*)    Hgb urine dipstick TRACE (*)    Ketones, ur 15 (*)    All other components within normal limits  HEPATIC FUNCTION PANEL - Abnormal; Notable for the following:     Albumin 3.4 (*)    All other components within normal limits  I-STAT CHEM 8, ED - Abnormal; Notable for the following:    Calcium, Ion 0.96 (*)    All other components within normal limits  URINE CULTURE  LIPASE, BLOOD  CBC WITH DIFFERENTIAL  URINE MICROSCOPIC-ADD ON    Imaging Review Ct Chest W Contrast  06/12/2014   CLINICAL DATA:  Dysphagia for 2 days, history of metastatic adrenocortical carcinoma, status post left upper lobe wedge resection and bilateral adrenalectomies.  EXAM: CT CHEST WITH CONTRAST  TECHNIQUE: Multidetector CT imaging of the chest was performed during intravenous contrast administration.  CONTRAST:  31mL OMNIPAQUE IOHEXOL 300 MG/ML  SOLN  COMPARISON:  09/01/2012, 10/13/2013  FINDINGS: Atherosclerosis of the thoracic aorta without aneurysm or dissection. No mediastinal hemorrhage  or hematoma. Normal heart size. No pericardial effusion. Coronary calcifications noted. Stable mildly prominent mediastinal and AP window lymph nodes.  Persistent large hiatal hernia with the majority of the stomach in the chest. Compared to the prior studies, there is diffuse distal esophageal wall thickening and edema with mucosal enhancement, images 32 through 47. This extends to the GE junction. Appearance is compatible with acute severe esophagitis. Small amount of paraesophageal mediastinal free fluid, image 45 suspect related to the acute inflammatory process. No mediastinal air demonstrated to suggest perforation.  Included upper abdomen demonstrates prior bilateral adrenalectomies. Mild distention the gallbladder which is partially imaged. Atherosclerosis of the abdominal aorta. Colonic diverticulosis of the splenic flexure.  Lung windows demonstrate extensive emphysema. Previous wedge resection in the left upper lobe where a suture line is visualized.  Right upper lobe demonstrates a slightly larger 8 mm ground glass sub solid nodule, image 23. This remains indeterminate.  No focal pneumonia,  collapse or consolidation. No edema process or interstitial disease. Trachea and central airways are patent.  Diffuse degenerative changes of the thoracic spine. Chronic mild compression fracture T12. No acute osseous finding.  IMPRESSION: Abnormal distal esophagus with diffuse wall thickening/ edema and mucosal enhancement with an associated large hiatal hernia. Small amount of adjacent paraesophageal posterior mediastinal free fluid but no mediastinal air. Appearance is compatible with severe acute esophagitis.  Stable borderline mediastinal lymph nodes.  Severe emphysema and postop changes from left upper lobe wedge resection  8 mm right upper lobe sub solid ground-glass nodule, slightly larger compared to 10/13/2013.  Adenocarcinoma is considered. Nonemergent Thoracic surgery consultation is recommended.These recommendations are taken from:Recommendations for the Management of Subsolid Pulmonary Nodules Detected at CT: A Statement from the [facility] 2013; 266:1, [id number].  Previous bilateral adrenalectomies   Electronically Signed   By: Daryll Brod M.D.   On: 06/12/2014 13:13     EKG Interpretation None      MDM   Final diagnoses:  Esophagitis  Nodule of right lung    Symptoms and imaging are consistent with esophagitis.  This is a nonspecific finding, and will need outpatient treatment and follow-up.  The dental lung nodule, in patient with history of lung cancer.  Patient understands the need for follow-up.  Nursing Notes Reviewed/ Care Coordinated Applicable Imaging Reviewed Interpretation of Laboratory Data incorporated into ED treatment  The patient appears reasonably screened and/or stabilized for discharge and I doubt any other medical condition or other [facility] requiring further screening, evaluation, or treatment in the ED at this time prior to discharge.  Plan: Home Medications-Maalox,  Protonix; Home Treatments- rest; return here if the recommended treatment,  does not improve the symptoms; Recommended follow up- GI and Oncology 1 week    Richarda Blade, MD 06/12/14 [phone]

## 2014-06-12 NOTE — ED Notes (Addendum)
Pt reports hx of lung cancer, cancer resolved. esophageal/sternal pain when swallowing x2 days. Pain 9/10. No pain unless swallowing. Denies n/v/d.

## 2014-06-12 NOTE — Discharge Instructions (Signed)
Esophagitis Esophagitis is inflammation of the esophagus. It can involve swelling, soreness, and pain in the esophagus. This condition can make it difficult and painful to swallow. CAUSES  Most causes of esophagitis are not serious. Many different factors can cause esophagitis, including:  Gastroesophageal reflux disease (GERD). This is when acid from your stomach flows up into the esophagus.  Recurrent vomiting.  An allergic-type reaction.  Certain medicines, especially those that come in large pills.  Ingestion of harmful chemicals, such as household cleaning products.  Heavy alcohol use.  An infection of the esophagus.  Radiation treatment for cancer.  Certain diseases such as sarcoidosis, Crohn's disease, and scleroderma. These diseases may cause recurrent esophagitis. SYMPTOMS   Trouble swallowing.  Painful swallowing.  Chest pain.  Difficulty breathing.  Nausea.  Vomiting.  Abdominal pain. DIAGNOSIS  Your caregiver will take your history and do a physical exam. Depending upon what your caregiver finds, certain tests may also be done, including:  Barium X-ray. You will drink a solution that coats the esophagus, and X-rays will be taken.  Endoscopy. A lighted tube is put down the esophagus so your caregiver can examine the area.  Allergy tests. These can sometimes be arranged through follow-up visits. TREATMENT  Treatment will depend on the cause of your esophagitis. In some cases, steroids or other medicines may be given to help relieve your symptoms or to treat the underlying cause of your condition. Medicines that may be recommended include:  Viscous lidocaine, to soothe the esophagus.  Antacids.  Acid reducers.  Proton pump inhibitors.  Antiviral medicines for certain viral infections of the esophagus.  Antifungal medicines for certain fungal infections of the esophagus.  Antibiotic medicines, depending on the cause of the esophagitis. HOME CARE  INSTRUCTIONS   Avoid foods and drinks that seem to make your symptoms worse.  Eat small, frequent meals instead of large meals.  Avoid eating for the 3 hours prior to your bedtime.  If you have trouble taking pills, use a pill splitter to decrease the size and likelihood of the pill getting stuck or injuring the esophagus on the way down. Drinking water after taking a pill also helps.  Stop smoking if you smoke.  Maintain a healthy weight.  Wear loose-fitting clothing. Do not wear anything tight around your waist that causes pressure on your stomach.  Raise the head of your bed 6 to 8 inches with wood blocks to help you sleep. Extra pillows will not help.  Only take over-the-counter or prescription medicines as directed by your caregiver. SEEK IMMEDIATE MEDICAL CARE IF:  You have severe chest pain that radiates into your arm, neck, or jaw.  You feel sweaty, dizzy, or lightheaded.  You have shortness of breath.  You vomit blood.  You have difficulty or pain with swallowing.  You have bloody or black, tarry stools.  You have a fever.  You have a burning sensation in the chest more than 3 times a week for more than 2 weeks.  You cannot swallow, drink, or eat.  You drool because you cannot swallow your saliva. MAKE SURE YOU:  Understand these instructions.  Will watch your condition.  Will get help right away if you are not doing well or get worse. Document Released: 07/11/2004 Document Revised: 08/26/2011 Document Reviewed: 02/01/2011 [facility] Patient Information 2015 [employer], [place]. This information is not intended to replace advice given to you by your health care provider. Make sure you discuss any questions you have with your health care provider.  Pulmonary Nodule A pulmonary nodule is a small, round growth of tissue in the lung. Pulmonary nodules can range in size from less than 1/5 inch (4 mm) to a little bigger than an inch (25 mm). Most pulmonary nodules are  detected when imaging tests of the lung are being performed for a different problem. Pulmonary nodules are usually not cancerous (benign). However, some pulmonary nodules are cancerous (malignant). Follow-up treatment or testing is based on the size of the pulmonary nodule and your risk of getting lung cancer.  CAUSES Benign pulmonary nodules can be caused by various things. Some of the causes include:   Bacterial, fungal, or viral infections. This is usually an old infection that is no longer active, but it can sometimes be a current, active infection.  A benign mass of tissue.  Inflammation from conditions such as rheumatoid arthritis.   Abnormal blood vessels in the lungs. Malignant pulmonary nodules can result from lung cancer or from cancers that spread to the lung from other places in the body. SIGNS AND SYMPTOMS Pulmonary nodules usually do not cause symptoms. DIAGNOSIS Most often, pulmonary nodules are found incidentally when an X-ray or CT scan is performed to look for some other problem in the lung area. To help determine whether a pulmonary nodule is benign or malignant, your health care provider will take a medical history and order a variety of tests. Tests done may include:   Blood tests.  A skin test called a tuberculin test. This test is used to determine if you have been exposed to the germ that causes tuberculosis.   Chest X-rays. If possible, a new X-ray may be compared with X-rays you have had in the past.   CT scan. This test shows smaller pulmonary nodules more clearly than an X-ray.   Positron emission tomography (PET) scan. In this test, a safe amount of a radioactive substance is injected into the bloodstream. Then, the scan takes a picture of the pulmonary nodule. The radioactive substance is eliminated from your body in your urine.   Biopsy. A tiny piece of the pulmonary nodule is removed so it can be checked under a microscope. TREATMENT  Pulmonary  nodules that are benign normally do not require any treatment because they usually do not cause symptoms or breathing problems. Your health care provider may want to monitor the pulmonary nodule through follow-up CT scans. The frequency of these CT scans will vary based on the size of the nodule and the risk factors for lung cancer. For example, CT scans will need to be done more frequently if the pulmonary nodule is larger and if you have a history of smoking and a family history of cancer. Further testing or biopsies may be done if any follow-up CT scan shows that the size of the pulmonary nodule has increased. HOME CARE INSTRUCTIONS  Only take over-the-counter or prescription medicines as directed by your health care provider.  Keep all follow-up appointments with your health care provider. SEEK MEDICAL CARE IF:  You have trouble breathing when you are active.   You feel sick or unusually tired.   You do not feel like eating.   You lose weight without trying to.   You develop chills or night sweats.  SEEK IMMEDIATE MEDICAL CARE IF:  You cannot catch your breath, or you begin wheezing.   You cannot stop coughing.   You cough up blood.   You become dizzy or feel like you are going to pass out.  You have sudden chest pain.   You have a fever or persistent symptoms for more than 2-3 days.   You have a fever and your symptoms suddenly get worse. MAKE SURE YOU:  Understand these instructions.  Will watch your condition.  Will get help right away if you are not doing well or get worse. Document Released: 03/31/2009 Document Revised: 02/03/2013 Document Reviewed: 11/23/2012 [facility] Patient Information 2015 [place], [place]. This information is not intended to replace advice given to you by your health care provider. Make sure you discuss any questions you have with your health care provider.

## 2014-06-13 ENCOUNTER — Telehealth: Payer: Self-pay | Admitting: Oncology

## 2014-06-13 ENCOUNTER — Telehealth: Payer: Self-pay | Admitting: *Deleted

## 2014-06-13 ENCOUNTER — Telehealth: Payer: Self-pay | Admitting: Internal Medicine

## 2014-06-13 NOTE — Telephone Encounter (Signed)
S/w pt confirmed MD visit per 12/28 POF... KJ

## 2014-06-13 NOTE — Telephone Encounter (Signed)
PT. HAS ESOPHAGITIS AND A NODULE OF THE RIGHT LUNG. PT. WAS INSTRUCTED TO SEE DR.KAPLAN IN ONE WEEK FOR THE ESOPHAGITIS. ALSO TO SEE DR.SHERRILL AS SOON AS POSSIBLE FOR THE NODULE OF THE RIGHT LUNG. THIS NOTE TO DR. Greenwood TO DR.SHERRILL'S NURSE, AMY HORTON,RN.

## 2014-06-13 NOTE — Telephone Encounter (Signed)
Pt's daughter left message re: ED visit 06/12/14 and requesting appt re: right lung nodule.  Per Dr. Benay Spice; notified pt's daughter [name] that next available appt would be 06/29/13 @ 10:30 with MD; MD will be out of the office until 06/22/13.  Pt's daughter verbalized understanding and expressed appreciation for call back.

## 2014-06-13 NOTE — Telephone Encounter (Signed)
Spoke with patient's daughter. She was seen in ED on Sunday and was diagnosed with esophagitis. She was told by her oncologist to schedule OV here. Scheduled on 06/20/14 at 2:00 PM with Nicoletta Ba, PA .

## 2014-06-14 ENCOUNTER — Encounter: Payer: Self-pay | Admitting: Gastroenterology

## 2014-06-16 LAB — URINE CULTURE: Special Requests: NORMAL

## 2014-06-17 ENCOUNTER — Telehealth (HOSPITAL_COMMUNITY): Payer: Self-pay | Admitting: *Deleted

## 2014-06-20 ENCOUNTER — Ambulatory Visit (INDEPENDENT_AMBULATORY_CARE_PROVIDER_SITE_OTHER): Payer: Medicare Other | Admitting: Physician Assistant

## 2014-06-20 ENCOUNTER — Encounter: Payer: Self-pay | Admitting: Physician Assistant

## 2014-06-20 ENCOUNTER — Telehealth: Payer: Self-pay | Admitting: *Deleted

## 2014-06-20 VITALS — BP 122/80 | HR 84 | Ht 66.0 in | Wt 121.4 lb

## 2014-06-20 DIAGNOSIS — K449 Diaphragmatic hernia without obstruction or gangrene: Secondary | ICD-10-CM

## 2014-06-20 DIAGNOSIS — K209 Esophagitis, unspecified without bleeding: Secondary | ICD-10-CM

## 2014-06-20 DIAGNOSIS — R1310 Dysphagia, unspecified: Secondary | ICD-10-CM

## 2014-06-20 MED ORDER — PANTOPRAZOLE SODIUM 20 MG PO TBEC: 20.0000 mg | DELAYED_RELEASE_TABLET | Freq: Every day | ORAL | Status: DC

## 2014-06-20 NOTE — Patient Instructions (Signed)
You have been scheduled for an endoscopy. Please follow written instructions given to you at your visit today. If you use inhalers (even only as needed), please bring them with you on the day of your procedure. Dr. Silvano Rusk. We sent a prescription for Protonix 40 mg with 11 refills. We have given you antireflux information.

## 2014-06-20 NOTE — Telephone Encounter (Signed)
Called and LM for [employer] CRNA to advise this patient is scheduled for Wed 06-22-2014.  Dr Carlean Purl performing the EGD in the [facility].

## 2014-06-20 NOTE — Progress Notes (Signed)
Patient ID: Leslie Petersen, female   DOB: [birth year], 80 y.o.   MRN: [id number]   Subjective:    Patient ID: Leslie Petersen, female    DOB: [birth year], 80 y.o.   MRN: [id number]  HPI Leslie Petersen is a very nice 80 year old female known to Dr. Carlean Purl. She had undergone colonoscopy in 2010 and again in May 2013. She was noted to have diverticulosis but no polyps on follow-up. These were done for history of adenomatous polyps in 2002, 2005 and 2010. Patient has history of adrenocortical carcinoma and had undergone bilateral adrenalectomies in 2011 and also a left upper lobe resection for left upper lung adenocarcinoma in March 2011. In March 2014 she was noted to have a soft tissue nodule posterior to the left sacrum and biopsy of the left paraspinous mass in April 2015 confirmed that this was metastatic carcinoma adrenocortical in origin. This was excised in June at [facility]. Patient comes to our office today with complaints of acute onset of odynophagia around Christmas. She says this progressed for about 2 days and she went to the emergency room on 1227. She says it hurts to swallow anything at that time and she could feel it all the way down her esophagus into her stomach. Workup in the ER with CT scan of the chest on 1227 showed a large hiatal hernia with the majority of the stomach in the chest and diffuse distal esophageal wall thickening and edema to the GE junction this was felt consistent with a severe acute esophagitis. She had mild distention of the gallbladder colonic diverticulosis, extensive emphysema and borderline mediastinal adenopathy and an 8 mm right upper lobe nodule concerning for a carcinoma. She had been started on Protonix 40 mg by mouth daily and says that that is definitely helping. On further questioning it is noted she is on Fosamax and says she's been on that over the past few years. She did take the Fosamax the morning her symptoms started in said the pain started later in the  day. She denies any current dysphagia. She continues to have some burping and belching and some mild sensation of food sticking no regurgitation no nausea or vomiting. She is scheduled to see Dr. Benay Spice next week.  Review of Systems Pertinent positive and negative review of systems were noted in the above HPI section.  All other review of systems was otherwise negative.  Outpatient Encounter Prescriptions as of 06/20/2014  Medication Sig  . alendronate (FOSAMAX) 70 MG tablet Take 70 mg by mouth once a week. Takes on Sunday's  . aspirin EC 81 MG tablet Take 81 mg by mouth at bedtime.  . Calcium Carbonate-Vit D-Min (CALCIUM 1200 PO) Take 1,200 mg by mouth daily.  . cholecalciferol (VITAMIN D) 1000 UNITS tablet Take 1,000 Units by mouth daily.  Marland Kitchen docusate sodium (COLACE) 100 MG capsule Take 100-200 mg by mouth daily as needed for mild constipation.  . fish oil-omega-3 fatty acids 1000 MG capsule Take 1 g by mouth daily.  . fludrocortisone (FLORINEF) 0.1 MG tablet Take 0.1 mg by mouth daily at 12 noon.   . hydrocortisone (CORTEF) 10 MG tablet Take 10 mg by mouth daily with breakfast.   . Multiple Vitamins-Minerals (MULTIVITAMIN WITH MINERALS) tablet Take 1 tablet by mouth daily.  . pantoprazole (PROTONIX) 20 MG tablet Take 1 tablet (20 mg total) by mouth daily.  . polycarbophil (FIBERCON) 625 MG tablet Take 1,250 mg by mouth daily as needed for mild constipation.   . polyethylene glycol (MIRALAX /  GLYCOLAX) packet Take 17 g by mouth daily as needed for mild constipation.  . pseudoephedrine (SINUS & ALLERGY 12 HOUR) 120 MG 12 hr tablet Take 120 mg by mouth daily as needed for congestion.  . TRAVATAN Z 0.004 % SOLN ophthalmic solution Place 1 drop into both eyes at bedtime.   . [DISCONTINUED] pantoprazole (PROTONIX) 20 MG tablet Take 1 tablet (20 mg total) by mouth daily.  . [DISCONTINUED] Meth-Hyo-M Bl-Na Phos-Ph Sal (URIBEL) 118 MG CAPS Take 1 capsule by mouth 2 (two) times daily.   Allergies   Allergen Reactions  . Mirabegron Other (See Comments)    Patient had adverse effect to medication (patient does not want to take any overactive bladder medications).  Leslie Petersen [Bethanechol]    Patient Active Problem List   Diagnosis Date Noted  . Osteoporosis 02/17/2014  . Atypical chest pain 02/17/2014  . Hypotension 02/17/2014  . Adrenocortical carcinoma 02/17/2014  . Adenocarcinoma, lung 02/17/2014  . Chest pain 02/17/2014  . Personal history adenomatous of colonic polyps 10/24/2011   History   Social History  . Marital Status: Divorced    Spouse Name: N/A    Number of Children: N/A  . Years of Education: N/A   Occupational History  . Not on file.   Social History Main Topics  . Smoking status: Former Smoker    Quit date: 02/17/1989  . Smokeless tobacco: Never Used  . Alcohol Use: No  . Drug Use: No  . Sexual Activity: Not on file   Other Topics Concern  . Not on file   Social History Narrative    [name]'s family history is not on file.      Objective:    Filed Vitals:   06/20/14 1350  BP: 122/80  Pulse: 84    Physical Exam  well-developed elderly somewhat frail-appearing white female in no acute distress vitals as above height 5 foot 6 weight 121. HEENT; nontraumatic normocephalic EOMI PERRLA sclerae anicteric, Supple ;no JVD, Cardiovascular; regular rate and rhythm with S1-S2 no murmur or gallop, Pulmonary; clear bilaterally, Abdomen; soft nontender nondistended bowel sounds are active there is no palpable mass or hepatosplenomegaly, Rectal; exam not done, Extremities; no clubbing cyanosis or edema skin warm and dry , Psych; mood and affect appropriate      Assessment & Plan:   #1  80 yo female with an acute esophagitis documented on CT scan 06/12/2014. I suspect this may have been pill-induced from Fosamax; symptoms improving #2 huge hiatal hernia with intrathoracic stomach, rule out paraesophageal component-though patient denies any problems  with epigastric or chest pain associated with vomiting #3 metastatic adrenocortical carcinoma #4 history of adenocarcinoma of the left lung status post resection 2011 now with right upper lobe nodule concerning for adenocarcinoma also with borderline mediastinal adenopathy-has follow-up with oncology next week #5 steroid-dependent post bilateral adrenalectomies #6 osteoporosis #7 remote history of adenomatous colon polyps negative colonoscopy except diverticulosis 2013   Plan ; Patient advised to continue Protonix 40 mg by mouth every morning and plan to remain on this longer term Do not feelshe is a good candidate to stay on Fosamax given soda and known huge hiatal hernia with intrathoracic stomach. She's asked to discuss this with her endocrinologist reguarding alternatives Antireflux regimen reviewed including elevation of the head of the bed Will schedule for EGD with Dr. Carlean Purl to visualize distal esophagus especially in light of concerning findings on CT scan with borderline mediastinal adenopathy and right upper lobe nodule concerning for carcinoma. Procedure  discussed in detail with patient and she is agreeable to proceed.  Sibyl Mikula S Keiko Myricks PA-C 06/20/2014

## 2014-06-22 ENCOUNTER — Ambulatory Visit (AMBULATORY_SURGERY_CENTER): Payer: Medicare Other | Admitting: Internal Medicine

## 2014-06-22 ENCOUNTER — Encounter: Payer: Self-pay | Admitting: Internal Medicine

## 2014-06-22 ENCOUNTER — Encounter: Payer: Medicare Other | Admitting: Internal Medicine

## 2014-06-22 VITALS — BP 145/90 | HR 73 | Temp 96.1°F | Resp 20 | Ht 66.0 in | Wt 121.0 lb

## 2014-06-22 DIAGNOSIS — K209 Esophagitis, unspecified without bleeding: Secondary | ICD-10-CM

## 2014-06-22 DIAGNOSIS — D132 Benign neoplasm of duodenum: Secondary | ICD-10-CM

## 2014-06-22 DIAGNOSIS — K2210 Ulcer of esophagus without bleeding: Secondary | ICD-10-CM

## 2014-06-22 MED ORDER — SODIUM CHLORIDE 0.9 % IV SOLN: 500.0000 mL | INTRAVENOUS | Status: DC

## 2014-06-22 NOTE — Patient Instructions (Addendum)
The esophagus is inflamed - probably from Fosamax. There is a large hiatal hernia. ? Of a polyp in duodenum - I took biopsies to see.  I appreciate the opportunity to care for you. [name], MD, FACG    YOU HAD AN ENDOSCOPIC PROCEDURE TODAY AT [facility]: Refer to the procedure report that was given to you for any specific questions about what was found during the examination.  If the procedure report does not answer your questions, please call your gastroenterologist to clarify.  If you requested that your care partner not be given the details of your procedure findings, then the procedure report has been included in a sealed envelope for you to review at your convenience later.  YOU SHOULD EXPECT: Some feelings of bloating in the abdomen. Passage of more gas than usual.  Walking can help get rid of the air that was put into your GI tract during the procedure and reduce the bloating. If you had a lower endoscopy (such as a colonoscopy or flexible sigmoidoscopy) you may notice spotting of blood in your stool or on the toilet paper. If you underwent a bowel prep for your procedure, then you may not have a normal bowel movement for a few days.  DIET: Your first meal following the procedure should be a light meal and then it is ok to progress to your normal diet.  A half-sandwich or bowl of soup is an example of a good first meal.  Heavy or fried foods are harder to digest and may make you feel nauseous or bloated.  Likewise meals heavy in dairy and vegetables can cause extra gas to form and this can also increase the bloating.  Drink plenty of fluids but you should avoid alcoholic beverages for 24 hours.  ACTIVITY: Your care partner should take you home directly after the procedure.  You should plan to take it easy, moving slowly for the rest of the day.  You can resume normal activity the day after the procedure however you should NOT DRIVE or use heavy machinery for  24 hours (because of the sedation medicines used during the test).    SYMPTOMS TO REPORT IMMEDIATELY: A gastroenterologist can be reached at any hour.  During normal business hours, 8:30 AM to 5:00 PM Monday through Friday, call [phone].  After hours and on weekends, please call the GI answering service at [phone] who will take a message and have the physician on call contact you.   Following upper endoscopy (EGD)  Vomiting of blood or coffee ground material  New chest pain or pain under the shoulder blades  Painful or persistently difficult swallowing  New shortness of breath  Fever of 100F or higher  Black, tarry-looking stools  FOLLOW UP: If any biopsies were taken you will be contacted by phone or by letter within the next 1-3 weeks.  Call your gastroenterologist if you have not heard about the biopsies in 3 weeks.  Our staff will call the home number listed on your records the next business day following your procedure to check on you and address any questions or concerns that you may have at that time regarding the information given to you following your procedure. This is a courtesy call and so if there is no answer at the home number and we have not heard from you through the emergency physician on call, we will assume that you have returned to your regular daily activities without incident.  SIGNATURES/CONFIDENTIALITY: You and/or your care partner have signed paperwork which will be entered into your electronic medical record.  These signatures attest to the fact that that the information above on your After Visit Summary has been reviewed and is understood.  Full responsibility of the confidentiality of this discharge information lies with you and/or your care-partner.   Stop Fosmax, but resume remainder of medication. Information given on Esophagitis and Hiatal Hernia with discharge instructions.

## 2014-06-22 NOTE — Progress Notes (Signed)
Report to PACU, RN, vss, BBS= Clear.  

## 2014-06-22 NOTE — Op Note (Signed)
[facility]  [address] [place], [address]   ENDOSCOPY PROCEDURE REPORT  PATIENT: Leslie, Petersen  MR#: [id number] BIRTHDATE: [birth year] , 58  yrs. old GENDER: female ENDOSCOPIST: [name], MD, [facility] PROCEDURE DATE:  06/22/2014 PROCEDURE:  EGD w/ biopsy ASA CLASS:     Class III INDICATIONS:  thickened esophagus on CT, esophagitis. MEDICATIONS: Propofol 100 mg IV and Monitored anesthesia care TOPICAL ANESTHETIC: none  DESCRIPTION OF PROCEDURE: After the risks benefits and alternatives of the procedure were thoroughly explained, informed consent was obtained.  The LB [id number] endoscope was introduced through the mouth and advanced to the second portion of the duodenum , Without limitations.  The instrument was slowly withdrawn as the mucosa was fully examined.    1) Esophagitis with linear ulcers 30-35 cm - biopsied 2) 10 cm hiatal hernia 35-45 cm 3) ? flat duodenal polyp (10-15 mm) - second portion - biopsied 4) Otherwise normal exam.  Retroflexed views revealed as previously described.     The scope was then withdrawn from the patient and the procedure completed.  COMPLICATIONS: There were no immediate complications.  ENDOSCOPIC IMPRESSION: 1) Esophagitis with linear ulcers 30-35 cm - biopsied 2) 10 cm hiatal hernia 35-45 cm 3) ? flat duodenal polyp (10-15 mm) - second portion - biopsied 4) Otherwise normal exam  RECOMMENDATIONS: 1.  Await pathology results 2.  Stop Fosamax  REPEAT EXAM:  eSigned:  [name], MD, [facility] 06/22/2014 3:31 PM    CC: The patient and Kathyrn Lass, MD

## 2014-06-22 NOTE — Progress Notes (Signed)
Called to room to assist during endoscopic procedure.  Patient ID and intended procedure confirmed with present staff. Received instructions for my participation in the procedure from the performing physician.  

## 2014-06-23 ENCOUNTER — Telehealth: Payer: Self-pay

## 2014-06-23 NOTE — Progress Notes (Signed)
Agree with Ms. Esterwood's assessment and plan. Felicie Kocher E. Petina Muraski, MD, FACG   

## 2014-06-23 NOTE — Telephone Encounter (Signed)
  Follow up Call-  Call back number 06/22/2014 10/24/2011  Post procedure Call Back phone  # [phone]  Permission to leave phone message Yes Yes     Patient questions:  Do you have a fever, pain , or abdominal swelling? No. Pain Score  0 *  Have you tolerated food without any problems? Yes.    Have you been able to return to your normal activities? Yes.    Do you have any questions about your discharge instructions: Diet   No. Medications  No. Follow up visit  No.  Do you have questions or concerns about your Care? No.  Actions: * If pain score is 4 or above: No action needed, pain <4.  No problems per the pt. maw

## 2014-06-29 ENCOUNTER — Ambulatory Visit (HOSPITAL_BASED_OUTPATIENT_CLINIC_OR_DEPARTMENT_OTHER): Payer: Medicare Other | Admitting: Oncology

## 2014-06-29 ENCOUNTER — Telehealth: Payer: Self-pay | Admitting: Oncology

## 2014-06-29 VITALS — BP 151/81 | HR 86 | Temp 98.1°F | Resp 19 | Ht 66.0 in | Wt 119.9 lb

## 2014-06-29 DIAGNOSIS — C7401 Malignant neoplasm of cortex of right adrenal gland: Secondary | ICD-10-CM

## 2014-06-29 DIAGNOSIS — C7400 Malignant neoplasm of cortex of unspecified adrenal gland: Secondary | ICD-10-CM

## 2014-06-29 DIAGNOSIS — C7490 Malignant neoplasm of unspecified part of unspecified adrenal gland: Principal | ICD-10-CM

## 2014-06-29 NOTE — Telephone Encounter (Signed)
Gave avs & cal for April. °

## 2014-06-29 NOTE — Progress Notes (Signed)
  [facility] OFFICE PROGRESS NOTE   Diagnosis: Adrenocortical carcinoma  INTERVAL HISTORY:   She returns prior to a scheduled visit. She was seen in emergency room 06/12/2014 with odynophagia. A CT of the chest confirmed a hiatal hernia. She was referred to gastroenterology and was taken to an upper endoscopy by Dr. Carlean Purl on 06/22/2014. This confirmed esophagitis and a hiatal hernia. A duodenal polyp was biopsied. The biopsy from the duodenum revealed fragments of a duodenal adenoma, the esophagus biopsy revealed acute esophagitis. She was taken off of Fosamax and started Protonix. The esophagus symptoms have resolved.  The CT of the chest 06/12/2014 revealed an 8 mm groundglass of solid nodule in the right upper lobe. Extensive emphysema. Suture line from the left upper lobe wedge resection.  Objective:  Vital signs in last 24 hours:  Blood pressure 151/81, pulse 86, temperature 98.1 F (36.7 C), temperature source Oral, resp. rate 19, height 5\' 6"  (1.676 m), weight 119 lb 14.4 oz (54.386 kg), SpO2 98 %.    HEENT: Neck without mass Lymphatics: No cervical, supra-clavicular, axillary, or inguinal nodes Resp: Bronchial sounds in the left chest, no respiratory distress, distant breath sounds Cardio: Regular rate and rhythm GI: No hepatomegaly, nontender, no mass Vascular: No leg edema  Skin: Left lower back and left chest scar is without evidence of recurrent tumor   Portacath/PICC-without erythema  Lab Results:  Lab Results  Component Value Date   WBC 7.4 06/12/2014   HGB 14.6 06/12/2014   HCT 43.0 06/12/2014   MCV 97.1 06/12/2014   PLT 221 06/12/2014   NEUTROABS 5.5 06/12/2014    Lab Results  Component Value Date   NA 139 06/12/2014    No results found for: CEA  Imaging:  I reviewed 06/12/2014 CT with Ms. Hartung and her daughter. There is a sub-solid nodular area in the right upper lung. Medications: I have reviewed the patient's current  medications.  Assessment/Plan: 1. 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 2. Left upper lung adenocarcinoma (T1 N0), status post a wedge resection, 08/15/2009. 3. Left adrenocortical carcinoma, status post a left adrenalectomy in 1994. 4. CT of the chest and abdomen, 09/04/2012. No evidence of tumor recurrence or metastatic disease in the chest or abdomen. 5. Soft tissue nodule noted on the CT 09/04/2012, posterior to the left sacrum-present on a PET/CT 07/06/2009 without hypermetabolic activity. Larger on the pelvic CT 09/04/2013, the lesion had associated enhancement on an MRI 09/16/2013  Biopsy of the left paraspinous mass 10/01/2013 confirmed metastatic carcinoma consistent with metastatic adrenocortical carcinoma  Excision of the left paraspinous mass at [facility] on 12/03/2013 with the pathology confirming metastatic adrenocortical carcinoma with negative surgical margins 6. Enterococcus urinary tract infection 09/04/2013 7. Esophagitis,? Secondary to Fosamax, improved with Protonix 8. CT of the chest 06/12/2014 with an indeterminate right lung nodule 9. Duodenal polyp, status post a biopsy confirming a tubular adenoma on an endoscopy 06/22/2014   Disposition:  Ms. Knapper remains in clinical remission from the adrenocortical carcinoma. I reviewed the 06/12/2014 CT images with Ms. Bolding and her daughter. I suspect the right lung lesion is a benign finding. She will be scheduled for a follow-up CT at a three-month interval. She will return for an office visit a few days after the CT scan. We will schedule a PET scan and make a surgical referral as indicated based on the follow-up CT.  Betsy Coder, MD  06/29/2014  10:29 AM

## 2014-06-30 NOTE — Progress Notes (Signed)
Quick Note:  Call her and explain esophagus bxs show inflammation - likely from the Fosamax There is a polyp in the duodenum that we would usually remove because it is precancerous - this is not urgent and I would like to see her in office next available appt to discuss  [place] - no letter or recall ______

## 2014-08-16 ENCOUNTER — Other Ambulatory Visit: Payer: Medicare Other

## 2014-08-16 ENCOUNTER — Ambulatory Visit (HOSPITAL_COMMUNITY): Payer: Medicare Other

## 2014-08-18 ENCOUNTER — Ambulatory Visit: Payer: Medicare Other | Admitting: Oncology

## 2014-08-29 ENCOUNTER — Ambulatory Visit (INDEPENDENT_AMBULATORY_CARE_PROVIDER_SITE_OTHER): Payer: Medicare Other | Admitting: Internal Medicine

## 2014-08-29 ENCOUNTER — Encounter: Payer: Self-pay | Admitting: Internal Medicine

## 2014-08-29 VITALS — BP 136/70 | HR 84 | Ht 62.5 in | Wt 122.4 lb

## 2014-08-29 DIAGNOSIS — D132 Benign neoplasm of duodenum: Secondary | ICD-10-CM

## 2014-08-29 HISTORY — DX: Benign neoplasm of duodenum: D13.2

## 2014-08-29 NOTE — Assessment & Plan Note (Signed)
This is a premalignant lesion. It is reasonable to try to remove it. I have explained the risks benefits and indications of polypectomy to the patient and her daughter. Would have a low threshold to clip in this area. We'll have the argon plasma coagulator available. The risks and benefits as well as alternatives of endoscopic procedure(s) have been discussed and reviewed. All questions answered. The patient agrees to proceed.   15 minutes time spent with patient > half in counseling coordination of care

## 2014-08-29 NOTE — Progress Notes (Signed)
Subjective:    Patient ID: Leslie Petersen, female    DOB: 13-Jun-1935, 80 y.o.   MRN: [id number] Chief complaint: Follow-up dysphagia and follow-up duodenal polyp HPI Patient is a nice elderly lady with a history of multiple cancers to had an upper endoscopy for dysphagia. She had inflammation which I attributed to Fosamax, she has stopped Fosamax and the dysphagia is gone. She had a 10-15 mm polyp in the duodenum sessile or flat was biopsied, I wasn't certain it was really a polyp in it came back adenoma. She is here to discuss treatment of that. Her daughter is with her. She is not having any active GI symptoms at this time on a PPI. On a CT scan there was slightly enlarged 8 mm nodule in the lung which will get follow-up next month with another chest CT coordinated through Dr. Ammie Dalton her oncologist.  Allergies  Allergen Reactions  . Mirabegron Other (See Comments)    Patient had adverse effect to medication (patient does not want to take any overactive bladder medications).  Bernadene Bell [Bethanechol]    Outpatient Prescriptions Prior to Visit  Medication Sig Dispense Refill  . aspirin EC 81 MG tablet Take 81 mg by mouth at bedtime.    . Calcium Carbonate-Vit D-Min (CALCIUM 1200 PO) Take 600 mg by mouth daily.     . cholecalciferol (VITAMIN D) 1000 UNITS tablet Take 1,000 Units by mouth daily.    Marland Kitchen docusate sodium (COLACE) 100 MG capsule Take 100-200 mg by mouth daily as needed for mild constipation.    . fish oil-omega-3 fatty acids 1000 MG capsule Take 1 g by mouth daily.    . fludrocortisone (FLORINEF) 0.1 MG tablet Take 0.1 mg by mouth daily at 12 noon.     . hydrocortisone (CORTEF) 10 MG tablet Take 10 mg by mouth daily with breakfast.     . Multiple Vitamins-Minerals (MULTIVITAMIN WITH MINERALS) tablet Take 1 tablet by mouth daily.    . pantoprazole (PROTONIX) 20 MG tablet Take 1 tablet (20 mg total) by mouth daily. 30 tablet 11  . polycarbophil (FIBERCON) 625 MG tablet Take 1,250 mg  by mouth daily as needed for mild constipation.     . TRAVATAN Z 0.004 % SOLN ophthalmic solution Place 1 drop into both eyes at bedtime.     . Vitamins-Lipotropics (LIPO-FLAVONOID PLUS PO) Take 1 tablet by mouth daily as needed (ringing in ears).     . polyethylene glycol (MIRALAX / GLYCOLAX) packet Take 17 g by mouth daily as needed for mild constipation.    . pseudoephedrine (SINUS & ALLERGY 12 HOUR) 120 MG 12 hr tablet Take 120 mg by mouth daily as needed for congestion.     No facility-administered medications prior to visit.   Past Medical History  Diagnosis Date  . Allergy     seasonal  . Lung cancer, upper lobe, left 2011    adenocarcinoma resected   . Cataract   . Glaucoma   . Osteoporosis   . Adenomatous colon polyp   . 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 Lung cancer surgery  08/2009  . Adrenal gland surgery  1990/2011  . Colonoscopy  multiple  . Malignant tumor from back  June 2015    at [facility] "related to adrenal gland cancer" per pt  . Esophagogastroduodenoscopy     History   Social History  . Marital Status: Divorced  Spouse Name: N/A  . Number of Children: N/A  . Years of Education: N/A   Social History Main Topics  . Smoking status: Former Smoker    Quit date: 02/17/1989  . Smokeless tobacco: Never Used  . Alcohol Use: No  . Drug Use: No  . Sexual Activity: Not on file   Other Topics Concern  . None   Social History Narrative   History reviewed. There is no significant relative family history in this patient   Review of Systems As per history of present illness    Objective:   Physical Exam BP 136/70 mmHg  Pulse 84  Ht 5' 2.5" (1.588 m)  Wt 122 lb 6 oz (55.509 kg)  BMI 22.01 kg/m2 General:  NAD Eyes:   anicteric Lungs:  clear Heart:  S1S2 no rubs, murmurs or gallops Abdomen:  soft and nontender, BS+ Ext:   No cyanosis or clubbing    Data Reviewed:  Previous EGD  and pathology reports     Assessment & Plan:  Benign neoplasm of duodenum This is a premalignant lesion. It is reasonable to try to remove it. I have explained the risks benefits and indications of polypectomy to the patient and her daughter. Would have a low threshold to clip in this area. We'll have the argon plasma coagulator available. The risks and benefits as well as alternatives of endoscopic procedure(s) have been discussed and reviewed. All questions answered. The patient agrees to proceed.   15 minutes time spent with patient > half in counseling coordination of care       Once this is taking care of can consider repeating a colonoscopy possibly. She did have a small tubulovillous adenoma the colon removed in 2010.  I appreciate the opportunity to care for this patient. CC: Tawanna Solo, MD

## 2014-08-29 NOTE — Patient Instructions (Signed)
  You have been scheduled for an endoscopy. Please follow written instructions given to you at your visit today. If you use inhalers (even only as needed), please bring them with you on the day of your procedure.   I appreciate the opportunity to care for you. Carl Gessner, MD, FACG 

## 2014-09-05 ENCOUNTER — Encounter (HOSPITAL_COMMUNITY): Payer: Self-pay | Admitting: *Deleted

## 2014-09-16 ENCOUNTER — Ambulatory Visit (HOSPITAL_COMMUNITY): Payer: Medicare Other | Admitting: Anesthesiology

## 2014-09-16 ENCOUNTER — Encounter (HOSPITAL_COMMUNITY): Payer: Self-pay | Admitting: Gastroenterology

## 2014-09-16 ENCOUNTER — Ambulatory Visit (HOSPITAL_COMMUNITY)
Admission: RE | Admit: 2014-09-16 | Discharge: 2014-09-16 | Disposition: A | Discharge status: Home / Self Care | Payer: Medicare Other | Source: Ambulatory Visit | Attending: Internal Medicine | Admitting: Internal Medicine

## 2014-09-16 ENCOUNTER — Encounter (HOSPITAL_COMMUNITY)
Admission: RE | Disposition: A | Discharge status: Home / Self Care | Payer: Self-pay | Source: Ambulatory Visit | Attending: Internal Medicine

## 2014-09-16 DIAGNOSIS — D132 Benign neoplasm of duodenum: Secondary | ICD-10-CM | POA: Diagnosis not present

## 2014-09-16 DIAGNOSIS — Z85118 Personal history of other malignant neoplasm of bronchus and lung: Secondary | ICD-10-CM | POA: Insufficient documentation

## 2014-09-16 DIAGNOSIS — H409 Unspecified glaucoma: Secondary | ICD-10-CM | POA: Diagnosis not present

## 2014-09-16 DIAGNOSIS — M810 Age-related osteoporosis without current pathological fracture: Secondary | ICD-10-CM | POA: Insufficient documentation

## 2014-09-16 DIAGNOSIS — K449 Diaphragmatic hernia without obstruction or gangrene: Secondary | ICD-10-CM | POA: Diagnosis not present

## 2014-09-16 DIAGNOSIS — R1310 Dysphagia, unspecified: Secondary | ICD-10-CM | POA: Diagnosis present

## 2014-09-16 DIAGNOSIS — Z888 Allergy status to other drugs, medicaments and biological substances status: Secondary | ICD-10-CM | POA: Diagnosis not present

## 2014-09-16 DIAGNOSIS — Z87891 Personal history of nicotine dependence: Secondary | ICD-10-CM | POA: Diagnosis not present

## 2014-09-16 DIAGNOSIS — Z7982 Long term (current) use of aspirin: Secondary | ICD-10-CM | POA: Diagnosis not present

## 2014-09-16 HISTORY — PX: ESOPHAGOGASTRODUODENOSCOPY: SHX5428

## 2014-09-16 HISTORY — PX: HOT HEMOSTASIS: SHX5433

## 2014-09-16 SURGERY — EGD (ESOPHAGOGASTRODUODENOSCOPY)
Anesthesia: Monitor Anesthesia Care

## 2014-09-16 MED ORDER — PROPOFOL INFUSION 10 MG/ML OPTIME
INTRAVENOUS | Status: DC | PRN
  Administered 2014-09-16: 300 ug/kg/min via INTRAVENOUS

## 2014-09-16 MED ORDER — SODIUM CHLORIDE 0.9 % IV SOLN: INTRAVENOUS | Status: DC

## 2014-09-16 MED ORDER — LACTATED RINGERS IV SOLN
INTRAVENOUS | Status: DC
  Administered 2014-09-16: 1000 mL via INTRAVENOUS

## 2014-09-16 MED ORDER — PROPOFOL 10 MG/ML IV BOLUS
INTRAVENOUS | Status: AC
  Filled 2014-09-16: qty 20

## 2014-09-16 MED ORDER — GLUCAGON HCL RDNA (DIAGNOSTIC) 1 MG IJ SOLR
INTRAMUSCULAR | Status: AC
  Filled 2014-09-16: qty 1

## 2014-09-16 MED ORDER — ASPIRIN EC 81 MG PO TBEC: 81.0000 mg | DELAYED_RELEASE_TABLET | Freq: Every day | ORAL | Status: DC

## 2014-09-16 MED ORDER — GLUCAGON HCL RDNA (DIAGNOSTIC) 1 MG IJ SOLR
INTRAMUSCULAR | Status: DC | PRN
Start: 2014-09-16 — End: 2014-09-16
  Administered 2014-09-16: .5 mg via INTRAVENOUS

## 2014-09-16 NOTE — Interval H&P Note (Signed)
History and Physical Interval Note:  09/16/2014 8:38 AM  Leslie Petersen  has presented today for surgery, with the diagnosis of duodenal polyp   The various methods of treatment have been discussed with the patient and family. After consideration of risks, benefits and other options for treatment, the patient has consented to  Procedure(s): ESOPHAGOGASTRODUODENOSCOPY (EGD) (N/A) HOT HEMOSTASIS (ARGON PLASMA COAGULATION/BICAP) (N/A) as a surgical intervention .  The patient's history has been reviewed, patient examined, no change in status, stable for surgery.  I have reviewed the patient's chart and labs.  Questions were answered to the patient's satisfaction.     Silvano Rusk

## 2014-09-16 NOTE — Op Note (Signed)
[facility] [place], [address]   ENDOSCOPY PROCEDURE REPORT  PATIENT: Leslie, Petersen  MR#: [id number] BIRTHDATE: 07/24/1934 , 71  yrs. old GENDER: female ENDOSCOPIST: [name], MD, [facility] PROCEDURE DATE:  09/16/2014 PROCEDURE:  EGD w/ snare polypectomy and EGD w/ ablation ASA CLASS: INDICATIONS:  therapeutic procedure and removal of duodenal adenoma.  MEDICATIONS: Monitored anesthesia care and Per Anesthesia TOPICAL ANESTHETIC: none  DESCRIPTION OF PROCEDURE: After the risks benefits and alternatives of the procedure were thoroughly explained, informed consent was obtained.  The Pentax Gastroscope [id number] endoscope was introduced through the mouth and advanced to the second portion of the duodenum , Without limitations.  The instrument was slowly withdrawn as the mucosa was fully examined.    1) Flat polyp in proximal second portion of duodenum, max 12-15 mm. It was removed using cold snare and some biopsy forceps (sent to pathology) and then ablated the area and any residual tissue with APC on right colon settings. 2) Remainder of exam shows hiatal hernia and otherwise normal EGD.  Retroflexed views revealed a hiatal hernia.     The scope was then withdrawn from the patient and the procedure completed.  COMPLICATIONS: There were no immediate complications.  ENDOSCOPIC IMPRESSION: 1) Flat polyp in proximal second portion of duodenum, max 12-15 mm. It was removed using cold snare and some biopsy forceps (sent to pathology) and then ablated the area and any residual tissue with APC on right colon settings. 2) Remainder of exam shows hiatal hernia and otherwise normal EGD  RECOMMENDATIONS: Stop ASA and NSAID until 4/15 Await pathology report   eSigned:  [name], MD, [facility] 09/16/2014 9:27 AM    CC:The Patient and Kathyrn Lass, MD

## 2014-09-16 NOTE — Transfer of Care (Signed)
Immediate Anesthesia Transfer of Care Note  Patient: Leslie Petersen  Procedure(s) Performed: Procedure(s): ESOPHAGOGASTRODUODENOSCOPY (EGD) (N/A) HOT HEMOSTASIS (ARGON PLASMA COAGULATION/BICAP) (N/A)  Patient Location: PACU and Endoscopy Unit  Anesthesia Type:MAC  Level of Consciousness: sedated and patient cooperative  Airway & Oxygen Therapy: Patient Spontanous Breathing and Patient connected to nasal cannula oxygen  Post-op Assessment: Report given to RN and Post -op Vital signs reviewed and stable  Post vital signs: Reviewed and stable  Last Vitals:  Filed Vitals:   09/16/14 0704  BP: 173/81  Pulse: 80  Temp: 36.8 C  Resp: 11    Complications: No apparent anesthesia complications

## 2014-09-16 NOTE — Discharge Instructions (Signed)
° °  I removed the polyp as much as possible and then cauterized the area also. I will let you know the biopsy results and plans for follow-up. Please stay off aspirin and medications like aleve, Motrin, ibuprofen, advil, etc until then also. Tylenol is ok (acetaminophen).   I appreciate the opportunity to care for you. [name], MD, FACG  YOU HAD AN ENDOSCOPIC PROCEDURE TODAY: Refer to the procedure report and other information in the discharge instructions given to you for any specific questions about what was found during the examination. If this information does not answer your questions, please call Dr. Celesta Aver office at [phone] to clarify.   YOU SHOULD EXPECT: Some feelings of bloating in the abdomen. Passage of more gas than usual. Walking can help get rid of the air that was put into your GI tract during the procedure and reduce the bloating. If you had a lower endoscopy (such as a colonoscopy or flexible sigmoidoscopy) you may notice spotting of blood in your stool or on the toilet paper. Some abdominal soreness may be present for a day or two, also.  DIET: Your first meal following the procedure should be a light meal and then it is ok to progress to your normal diet. A half-sandwich or bowl of soup is an example of a good first meal. Heavy or fried foods are harder to digest and may make you feel nauseous or bloated. Drink plenty of fluids but you should avoid alcoholic beverages for 24 hours.   ACTIVITY: Your care partner should take you home directly after the procedure. You should plan to take it easy, moving slowly for the rest of the day. You can resume normal activity the day after the procedure however YOU SHOULD NOT DRIVE, use power tools, machinery or perform tasks that involve climbing or major physical exertion for 24 hours (because of the sedation medicines used during the test).   SYMPTOMS TO REPORT IMMEDIATELY: A gastroenterologist can be reached at any  hour. Please call [phone]  for any of the following symptoms:  Following upper endoscopy (EGD, EUS, ERCP, esophageal dilation) Vomiting of blood or coffee ground material  New, significant abdominal pain  New, significant chest pain or pain under the shoulder blades  Painful or persistently difficult swallowing  New shortness of breath  Black, tarry-looking or red, bloody stools  FOLLOW UP:  If any biopsies were taken you will be contacted by phone or by letter within the next 1-3 weeks. Call [phone]  if you have not heard about the biopsies in 3 weeks.  Please also call with any specific questions about appointments or follow up tests.

## 2014-09-16 NOTE — Anesthesia Preprocedure Evaluation (Signed)
Anesthesia Evaluation  Patient identified by MRN, date of birth, ID band Patient awake    Reviewed: Allergy & Precautions, NPO status , Patient's Chart, lab work & pertinent test results  Airway Mallampati: II  TM Distance: >3 FB Neck ROM: Full    Dental no notable dental hx.    Pulmonary former smoker,  Lung cancer breath sounds clear to auscultation  Pulmonary exam normal       Cardiovascular negative cardio ROS  Rhythm:Regular Rate:Normal     Neuro/Psych glaucoma negative psych ROS   GI/Hepatic negative GI ROS, Neg liver ROS,   Endo/Other  negative endocrine ROS  Renal/GU negative Renal ROS  negative genitourinary   Musculoskeletal negative musculoskeletal ROS (+)   Abdominal   Peds negative pediatric ROS (+)  Hematology negative hematology ROS (+)   Anesthesia Other Findings   Reproductive/Obstetrics negative OB ROS                             Anesthesia Physical Anesthesia Plan  ASA: II  Anesthesia Plan: MAC   Post-op Pain Management:    Induction: Intravenous  Airway Management Planned:   Additional Equipment:   Intra-op Plan:   Post-operative Plan:   Informed Consent: I have reviewed the patients History and Physical, chart, labs and discussed the procedure including the risks, benefits and alternatives for the proposed anesthesia with the patient or authorized representative who has indicated his/her understanding and acceptance.   Dental advisory given  Plan Discussed with: CRNA  Anesthesia Plan Comments:         Anesthesia Quick Evaluation

## 2014-09-16 NOTE — Anesthesia Postprocedure Evaluation (Signed)
  Anesthesia Post-op Note  Patient: Leslie Petersen  Procedure(s) Performed: Procedure(s) (LRB): ESOPHAGOGASTRODUODENOSCOPY (EGD) (N/A) HOT HEMOSTASIS (ARGON PLASMA COAGULATION/BICAP) (N/A)  Patient Location: PACU  Anesthesia Type: MAC  Level of Consciousness: awake and alert   Airway and Oxygen Therapy: Patient Spontanous Breathing  Post-op Pain: mild  Post-op Assessment: Post-op Vital signs reviewed, Patient's Cardiovascular Status Stable, Respiratory Function Stable, Patent Airway and No signs of Nausea or vomiting  Last Vitals:  Filed Vitals:   09/16/14 1000  BP: 162/86  Pulse: 71  Temp:   Resp: 26    Post-op Vital Signs: stable   Complications: No apparent anesthesia complications

## 2014-09-16 NOTE — H&P (View-Only) (Signed)
Subjective:    Patient ID: Leslie Petersen, female    DOB: 1934-06-25, 80 y.o.   MRN: [id number] Chief complaint: Follow-up dysphagia and follow-up duodenal polyp HPI Patient is a nice elderly lady with a history of multiple cancers to had an upper endoscopy for dysphagia. She had inflammation which I attributed to Fosamax, she has stopped Fosamax and the dysphagia is gone. She had a 10-15 mm polyp in the duodenum sessile or flat was biopsied, I wasn't certain it was really a polyp in it came back adenoma. She is here to discuss treatment of that. Her daughter is with her. She is not having any active GI symptoms at this time on a PPI. On a CT scan there was slightly enlarged 8 mm nodule in the lung which will get follow-up next month with another chest CT coordinated through Dr. Ammie Dalton her oncologist.  Allergies  Allergen Reactions  . Mirabegron Other (See Comments)    Patient had adverse effect to medication (patient does not want to take any overactive bladder medications).  Bernadene Bell [Bethanechol]    Outpatient Prescriptions Prior to Visit  Medication Sig Dispense Refill  . aspirin EC 81 MG tablet Take 81 mg by mouth at bedtime.    . Calcium Carbonate-Vit D-Min (CALCIUM 1200 PO) Take 600 mg by mouth daily.     . cholecalciferol (VITAMIN D) 1000 UNITS tablet Take 1,000 Units by mouth daily.    Marland Kitchen docusate sodium (COLACE) 100 MG capsule Take 100-200 mg by mouth daily as needed for mild constipation.    . fish oil-omega-3 fatty acids 1000 MG capsule Take 1 g by mouth daily.    . fludrocortisone (FLORINEF) 0.1 MG tablet Take 0.1 mg by mouth daily at 12 noon.     . hydrocortisone (CORTEF) 10 MG tablet Take 10 mg by mouth daily with breakfast.     . Multiple Vitamins-Minerals (MULTIVITAMIN WITH MINERALS) tablet Take 1 tablet by mouth daily.    . pantoprazole (PROTONIX) 20 MG tablet Take 1 tablet (20 mg total) by mouth daily. 30 tablet 11  . polycarbophil (FIBERCON) 625 MG tablet Take 1,250 mg  by mouth daily as needed for mild constipation.     . TRAVATAN Z 0.004 % SOLN ophthalmic solution Place 1 drop into both eyes at bedtime.     . Vitamins-Lipotropics (LIPO-FLAVONOID PLUS PO) Take 1 tablet by mouth daily as needed (ringing in ears).     . polyethylene glycol (MIRALAX / GLYCOLAX) packet Take 17 g by mouth daily as needed for mild constipation.    . pseudoephedrine (SINUS & ALLERGY 12 HOUR) 120 MG 12 hr tablet Take 120 mg by mouth daily as needed for congestion.     No facility-administered medications prior to visit.   Past Medical History  Diagnosis Date  . Allergy     seasonal  . Lung cancer, upper lobe, left 2011    adenocarcinoma resected   . Cataract   . Glaucoma   . Osteoporosis   . Adenomatous colon polyp   . 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 Lung cancer surgery  08/2009  . Adrenal gland surgery  1990/2011  . Colonoscopy  multiple  . Malignant tumor from back  June 2015    at [facility] "related to adrenal gland cancer" per pt  . Esophagogastroduodenoscopy     History   Social History  . Marital Status: Divorced  Spouse Name: N/A  . Number of Children: N/A  . Years of Education: N/A   Social History Main Topics  . Smoking status: Former Smoker    Quit date: 02/17/1989  . Smokeless tobacco: Never Used  . Alcohol Use: No  . Drug Use: No  . Sexual Activity: Not on file   Other Topics Concern  . None   Social History Narrative   History reviewed. There is no significant relative family history in this patient   Review of Systems As per history of present illness    Objective:   Physical Exam BP 136/70 mmHg  Pulse 84  Ht 5' 2.5" (1.588 m)  Wt 122 lb 6 oz (55.509 kg)  BMI 22.01 kg/m2 General:  NAD Eyes:   anicteric Lungs:  clear Heart:  S1S2 no rubs, murmurs or gallops Abdomen:  soft and nontender, BS+ Ext:   No cyanosis or clubbing    Data Reviewed:  Previous EGD  and pathology reports     Assessment & Plan:  Benign neoplasm of duodenum This is a premalignant lesion. It is reasonable to try to remove it. I have explained the risks benefits and indications of polypectomy to the patient and her daughter. Would have a low threshold to clip in this area. We'll have the argon plasma coagulator available. The risks and benefits as well as alternatives of endoscopic procedure(s) have been discussed and reviewed. All questions answered. The patient agrees to proceed.   15 minutes time spent with patient > half in counseling coordination of care       Once this is taking care of can consider repeating a colonoscopy possibly. She did have a small tubulovillous adenoma the colon removed in 2010.  I appreciate the opportunity to care for this patient. CC: Tawanna Solo, MD

## 2014-09-19 ENCOUNTER — Encounter (HOSPITAL_COMMUNITY): Payer: Self-pay | Admitting: Internal Medicine

## 2014-09-19 ENCOUNTER — Encounter: Payer: Self-pay | Admitting: Internal Medicine

## 2014-09-19 DIAGNOSIS — D132 Benign neoplasm of duodenum: Secondary | ICD-10-CM

## 2014-09-19 NOTE — Progress Notes (Signed)
Quick Note:  Duodenal adenoma - consider repeat exam 1 year ______

## 2014-09-28 ENCOUNTER — Encounter (HOSPITAL_COMMUNITY): Payer: Self-pay

## 2014-09-28 ENCOUNTER — Ambulatory Visit (HOSPITAL_COMMUNITY)
Admission: RE | Admit: 2014-09-28 | Discharge: 2014-09-28 | Disposition: A | Discharge status: Home / Self Care | Payer: Medicare Other | Source: Ambulatory Visit | Attending: Oncology | Admitting: Oncology

## 2014-09-28 ENCOUNTER — Other Ambulatory Visit (HOSPITAL_BASED_OUTPATIENT_CLINIC_OR_DEPARTMENT_OTHER): Payer: Medicare Other

## 2014-09-28 DIAGNOSIS — C7490 Malignant neoplasm of unspecified part of unspecified adrenal gland: Secondary | ICD-10-CM

## 2014-09-28 DIAGNOSIS — C7400 Malignant neoplasm of cortex of unspecified adrenal gland: Secondary | ICD-10-CM

## 2014-09-28 LAB — BASIC METABOLIC PANEL (CC13)
ANION GAP: 10 meq/L (ref 3–11)
BUN: 10.3 mg/dL (ref 7.0–26.0)
CHLORIDE: 104 meq/L (ref 98–109)
CO2: 29 mEq/L (ref 22–29)
Calcium: 9.2 mg/dL (ref 8.4–10.4)
Creatinine: 0.6 mg/dL (ref 0.6–1.1)
EGFR: 87 mL/min/{1.73_m2} — AB (ref >90)
Glucose: 73 mg/dl (ref 70–140)
POTASSIUM: 3.7 meq/L (ref 3.5–5.1)
SODIUM: 143 meq/L (ref 136–145)

## 2014-09-28 MED ORDER — IOHEXOL 300 MG/ML  SOLN
100.0000 mL | Freq: Once | INTRAMUSCULAR | Status: AC | PRN
  Administered 2014-09-28: 100 mL via INTRAVENOUS

## 2014-09-29 ENCOUNTER — Telehealth: Payer: Self-pay | Admitting: Oncology

## 2014-09-29 ENCOUNTER — Ambulatory Visit (HOSPITAL_BASED_OUTPATIENT_CLINIC_OR_DEPARTMENT_OTHER): Payer: Medicare Other | Admitting: Oncology

## 2014-09-29 VITALS — BP 142/76 | HR 95 | Temp 97.4°F | Resp 18 | Ht 64.0 in | Wt 119.3 lb

## 2014-09-29 DIAGNOSIS — C7490 Malignant neoplasm of unspecified part of unspecified adrenal gland: Principal | ICD-10-CM

## 2014-09-29 DIAGNOSIS — D132 Benign neoplasm of duodenum: Secondary | ICD-10-CM | POA: Diagnosis not present

## 2014-09-29 DIAGNOSIS — R911 Solitary pulmonary nodule: Secondary | ICD-10-CM

## 2014-09-29 DIAGNOSIS — C7400 Malignant neoplasm of cortex of unspecified adrenal gland: Secondary | ICD-10-CM

## 2014-09-29 DIAGNOSIS — Z85118 Personal history of other malignant neoplasm of bronchus and lung: Secondary | ICD-10-CM | POA: Diagnosis not present

## 2014-09-29 DIAGNOSIS — C7401 Malignant neoplasm of cortex of right adrenal gland: Secondary | ICD-10-CM | POA: Diagnosis not present

## 2014-09-29 NOTE — Telephone Encounter (Signed)
Appointments made and avs printed for patient °

## 2014-09-29 NOTE — Progress Notes (Signed)
[facility] OFFICE PROGRESS NOTE   Diagnosis: Adrenal cortical carcinoma  INTERVAL HISTORY:   Leslie Petersen returns as scheduled. She feels well. No specific complaint. No back pain.  Objective:  Vital signs in last 24 hours:  Blood pressure 142/76, pulse 95, temperature 97.4 F (36.3 C), temperature source Oral, resp. rate 18, height 5\' 4"  (1.626 m), weight 119 lb 4.8 oz (54.114 kg), SpO2 97 %.    HEENT: Neck without mass Lymphatics: No cervical, supra-clavicular, axillary, or inguinal nodes Resp: Lungs clear bilaterally Cardio: Regular rate and rhythm GI: No hepatomegaly, nontender, no mass Vascular: No leg edema Musculoskeletal: No spine tenderness or mass at the lower back    Imaging:  Ct Chest W Contrast  09/28/2014   CLINICAL DATA:  Left upper lobe lung adenocarcinoma status post resection. Bilateral adrenalectomy for adrenal cortical carcinoma 1994 and in 2011. Subsequent encounter.  EXAM: CT CHEST, ABDOMEN, AND PELVIS WITH CONTRAST  TECHNIQUE: Multidetector CT imaging of the chest, abdomen and pelvis was performed following the standard protocol during bolus administration of intravenous contrast.  CONTRAST:  161mL OMNIPAQUE IOHEXOL 300 MG/ML  SOLN  COMPARISON:  Chest CT 06/12/2014, PET-CT 10/13/2013 an abdominal pelvic CT 09/04/2013.  FINDINGS: CT CHEST FINDINGS  Mediastinum/Nodes: There are stable small AP window, right paratracheal and right hilar lymph nodes, not pathologically enlarged. No axillary adenopathy. The thyroid gland and trachea demonstrate no significant findings. There is a moderate size hiatal hernia. The heart size is normal. There is no pericardial effusion.There is diffuse atherosclerosis of the aorta, great vessels and coronary arteries.  Lungs/Pleura: There is no pleural effusion.Stable postsurgical changes in the left hemithorax status post left upper lobe wedge resection. Progressive enlargement of sub solid right upper lobe nodule,  measuring approximately 10 mm on image 22. This remains worrisome for adenocarcinoma. Small ground-glass nodule more superiorly in the right upper lobe on image number 16 is stable. There are no suspicious pulmonary nodules on the left. Moderate emphysematous changes are again noted.  Musculoskeletal/Chest wall: No chest wall mass or suspicious osseous findings. Chronic lower thoracic compression deformities and partially calcified disc protrusions are unchanged.  CT ABDOMEN AND PELVIS FINDINGS  Hepatobiliary: The liver is normal in density without focal abnormality. No evidence of gallstones, gallbladder wall thickening or biliary dilatation.  Pancreas: Unremarkable. No pancreatic ductal dilatation or surrounding inflammatory changes.  Spleen: Normal in size without focal abnormality.  Adrenals/Urinary Tract: There are stable postsurgical changes from previous bilateral adrenalectomy. Soft tissue nodule in the left adrenalectomy bed is stable, measuring 1.6 x 1.0 cm on image 55. This may be related to volume averaging with the pancreatic tail.The kidneys appear stable with bilateral cortical and renal sinus cysts. No hydronephrosis or urinary tract calculus identified. The bladder appears unremarkable.  Stomach/Bowel: No evidence of bowel wall thickening, distention or surrounding inflammatory change.A moderate size hiatal hernia as noted above. There are diverticular changes throughout the colon. The appendix appears normal.  Vascular/Lymphatic: There are no enlarged abdominal or pelvic lymph nodes. Stable aortoiliac atherosclerosis.  Reproductive: The uterus and ovaries appear normal. Pelvic floor laxity suspected.  Other: No evidence of abdominal wall mass or hernia.  Musculoskeletal: No acute or significant osseous findings. Scattered endplate degenerative changes in the lumbar spine are stable. There is mild residual asymmetric soft tissue density inferiorly in the left erector spinae muscle, but no residual  discrete mass or abnormal enhancement.  IMPRESSION: 1. Progressive enlargement of sub solid right upper lobe pulmonary nodule worrisome for adenocarcinoma.  2. Otherwise stable appearance of the chest status post wedge resection in the left upper lobe for lung cancer. No evidence of thoracic metastatic disease. 3. No discrete residual soft tissue mass identified within the left erector spinae muscle. 4. Otherwise stable abdominal pelvic CT status post bilateral adrenalectomy.   Electronically Signed   By: Richardean Sale M.D.   On: 09/28/2014 10:37   Ct Abdomen Pelvis W Contrast  09/28/2014   CLINICAL DATA:  Left upper lobe lung adenocarcinoma status post resection. Bilateral adrenalectomy for adrenal cortical carcinoma 1994 and in 2011. Subsequent encounter.  EXAM: CT CHEST, ABDOMEN, AND PELVIS WITH CONTRAST  TECHNIQUE: Multidetector CT imaging of the chest, abdomen and pelvis was performed following the standard protocol during bolus administration of intravenous contrast.  CONTRAST:  124mL OMNIPAQUE IOHEXOL 300 MG/ML  SOLN  COMPARISON:  Chest CT 06/12/2014, PET-CT 10/13/2013 an abdominal pelvic CT 09/04/2013.  FINDINGS: CT CHEST FINDINGS  Mediastinum/Nodes: There are stable small AP window, right paratracheal and right hilar lymph nodes, not pathologically enlarged. No axillary adenopathy. The thyroid gland and trachea demonstrate no significant findings. There is a moderate size hiatal hernia. The heart size is normal. There is no pericardial effusion.There is diffuse atherosclerosis of the aorta, great vessels and coronary arteries.  Lungs/Pleura: There is no pleural effusion.Stable postsurgical changes in the left hemithorax status post left upper lobe wedge resection. Progressive enlargement of sub solid right upper lobe nodule, measuring approximately 10 mm on image 22. This remains worrisome for adenocarcinoma. Small ground-glass nodule more superiorly in the right upper lobe on image number 16 is stable.  There are no suspicious pulmonary nodules on the left. Moderate emphysematous changes are again noted.  Musculoskeletal/Chest wall: No chest wall mass or suspicious osseous findings. Chronic lower thoracic compression deformities and partially calcified disc protrusions are unchanged.  CT ABDOMEN AND PELVIS FINDINGS  Hepatobiliary: The liver is normal in density without focal abnormality. No evidence of gallstones, gallbladder wall thickening or biliary dilatation.  Pancreas: Unremarkable. No pancreatic ductal dilatation or surrounding inflammatory changes.  Spleen: Normal in size without focal abnormality.  Adrenals/Urinary Tract: There are stable postsurgical changes from previous bilateral adrenalectomy. Soft tissue nodule in the left adrenalectomy bed is stable, measuring 1.6 x 1.0 cm on image 55. This may be related to volume averaging with the pancreatic tail.The kidneys appear stable with bilateral cortical and renal sinus cysts. No hydronephrosis or urinary tract calculus identified. The bladder appears unremarkable.  Stomach/Bowel: No evidence of bowel wall thickening, distention or surrounding inflammatory change.A moderate size hiatal hernia as noted above. There are diverticular changes throughout the colon. The appendix appears normal.  Vascular/Lymphatic: There are no enlarged abdominal or pelvic lymph nodes. Stable aortoiliac atherosclerosis.  Reproductive: The uterus and ovaries appear normal. Pelvic floor laxity suspected.  Other: No evidence of abdominal wall mass or hernia.  Musculoskeletal: No acute or significant osseous findings. Scattered endplate degenerative changes in the lumbar spine are stable. There is mild residual asymmetric soft tissue density inferiorly in the left erector spinae muscle, but no residual discrete mass or abnormal enhancement.  IMPRESSION: 1. Progressive enlargement of sub solid right upper lobe pulmonary nodule worrisome for adenocarcinoma. 2. Otherwise stable  appearance of the chest status post wedge resection in the left upper lobe for lung cancer. No evidence of thoracic metastatic disease. 3. No discrete residual soft tissue mass identified within the left erector spinae muscle. 4. Otherwise stable abdominal pelvic CT status post bilateral adrenalectomy.   Electronically Signed  By: Richardean Sale M.D.   On: 09/28/2014 10:37    Medications: I have reviewed the patient's current medications.  Assessment/Plan: 1. 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 2. Left upper lung adenocarcinoma (T1 N0), status post a wedge resection, 08/15/2009. 3. Left adrenocortical carcinoma, status post a left adrenalectomy in 1994. 4. CT of the chest and abdomen, 09/04/2012. No evidence of tumor recurrence or metastatic disease in the chest or abdomen. 5. Soft tissue nodule noted on the CT 09/04/2012, posterior to the left sacrum-present on a PET/CT 07/06/2009 without hypermetabolic activity. Larger on the pelvic CT 09/04/2013, the lesion had associated enhancement on an MRI 09/16/2013  Biopsy of the left paraspinous mass 10/01/2013 confirmed metastatic carcinoma consistent with metastatic adrenocortical carcinoma  Excision of the left paraspinous mass at [facility] on 12/03/2013 with the pathology confirming metastatic adrenocortical carcinoma with negative surgical margins  Restaging CTs 09/28/2014 with an enlarging right upper lobe nodule and no other evidence of metastatic disease 6. Enterococcus urinary tract infection 09/04/2013 7. Esophagitis,? Secondary to Fosamax, improved with Protonix 8. CT of the chest 06/12/2014 with an indeterminate right lung nodule  Progressive enlargement of a sub-solid right upper lobe nodule noted on the CT 09/28/2014 9. Duodenal polyp, status post a biopsy confirming a tubular adenoma on an endoscopy 06/22/2014  Removal of a duodenal polyp 09/16/2014-pathology revealed a duodenal  adenoma, no high-grade dysplasia or malignancy identified   Disposition: Leslie Petersen remains in clinical remission from the adrenocortical carcinoma. There is an enlarging right lung nodule that could represent a new lung primary or less likely an adrenocortical metastasis.  She will be referred for a PET scan and an evaluation by thoracic surgery/radiation oncology. She may be a candidate for stereotactic radiosurgery.  Leslie Petersen will return for office visit in 3 months. I will see her sooner as needed.  Betsy Coder, MD  09/29/2014  3:15 PM

## 2014-10-03 ENCOUNTER — Encounter: Payer: Self-pay | Admitting: *Deleted

## 2014-10-03 ENCOUNTER — Telehealth: Payer: Self-pay | Admitting: *Deleted

## 2014-10-03 NOTE — Telephone Encounter (Signed)
Called patient to set up with [facility] on 10/13/14 arrive at 2:45.  Patient verbalized understanding of appt time and place.

## 2014-10-03 NOTE — [facility] Oncology Navigator Note (Unsigned)
Patient called me back and had a few questions about clinic.  I updated her.

## 2014-10-10 ENCOUNTER — Encounter: Payer: Self-pay | Admitting: *Deleted

## 2014-10-11 ENCOUNTER — Ambulatory Visit (HOSPITAL_COMMUNITY)
Admission: RE | Admit: 2014-10-11 | Discharge: 2014-10-11 | Disposition: A | Discharge status: Home / Self Care | Payer: Medicare Other | Source: Ambulatory Visit | Attending: Oncology | Admitting: Oncology

## 2014-10-11 DIAGNOSIS — C7490 Malignant neoplasm of unspecified part of unspecified adrenal gland: Secondary | ICD-10-CM | POA: Diagnosis not present

## 2014-10-11 DIAGNOSIS — C7400 Malignant neoplasm of cortex of unspecified adrenal gland: Secondary | ICD-10-CM

## 2014-10-11 LAB — GLUCOSE, CAPILLARY: GLUCOSE-CAPILLARY: 91 mg/dL (ref 70–99)

## 2014-10-11 MED ORDER — FLUDEOXYGLUCOSE F - 18 (FDG) INJECTION
6.4000 | Freq: Once | INTRAVENOUS | Status: AC | PRN
  Administered 2014-10-11: 6.4 via INTRAVENOUS

## 2014-10-12 ENCOUNTER — Telehealth: Payer: Self-pay | Admitting: *Deleted

## 2014-10-12 ENCOUNTER — Encounter: Payer: Self-pay | Admitting: *Deleted

## 2014-10-12 NOTE — Telephone Encounter (Signed)
Left a message for the pt about clinic tomorrow w/ time of arrival and if she had any questions to please call.

## 2014-10-12 NOTE — [facility] Oncology Navigator Note (Unsigned)
Patient called.  She was confused about appt's, I clarified. She verbalized understanding.

## 2014-10-13 ENCOUNTER — Ambulatory Visit
Admission: RE | Admit: 2014-10-13 | Discharge: 2014-10-13 | Disposition: A | Discharge status: Home / Self Care | Payer: Medicare Other | Source: Ambulatory Visit | Attending: Radiation Oncology | Admitting: Radiation Oncology

## 2014-10-13 ENCOUNTER — Encounter: Payer: Self-pay | Admitting: *Deleted

## 2014-10-13 ENCOUNTER — Institutional Professional Consult (permissible substitution) (INDEPENDENT_AMBULATORY_CARE_PROVIDER_SITE_OTHER): Payer: Medicare Other | Admitting: Cardiothoracic Surgery

## 2014-10-13 ENCOUNTER — Ambulatory Visit: Payer: Medicare Other | Attending: Radiation Oncology | Admitting: Physical Therapy

## 2014-10-13 VITALS — BP 156/67 | HR 92 | Temp 98.6°F | Resp 18 | Wt 120.9 lb

## 2014-10-13 DIAGNOSIS — R599 Enlarged lymph nodes, unspecified: Secondary | ICD-10-CM

## 2014-10-13 DIAGNOSIS — R52 Pain, unspecified: Secondary | ICD-10-CM

## 2014-10-13 DIAGNOSIS — R293 Abnormal posture: Secondary | ICD-10-CM | POA: Diagnosis present

## 2014-10-13 DIAGNOSIS — C7400 Malignant neoplasm of cortex of unspecified adrenal gland: Secondary | ICD-10-CM

## 2014-10-13 DIAGNOSIS — Z9181 History of falling: Secondary | ICD-10-CM | POA: Diagnosis not present

## 2014-10-13 DIAGNOSIS — C7490 Malignant neoplasm of unspecified part of unspecified adrenal gland: Principal | ICD-10-CM

## 2014-10-13 DIAGNOSIS — R590 Localized enlarged lymph nodes: Secondary | ICD-10-CM

## 2014-10-13 NOTE — [facility] Oncology Navigator Note (Unsigned)
Spoke with patient today at [facility] during thoracic clinic.  I gave her some general information about cancer center but nothing on lung cancer due to no tissue dx.

## 2014-10-13 NOTE — Progress Notes (Signed)
[facility] Clinical Social Work  Clinical Social Work met with patient/family at [facility] appointment to offer support and assess for psychosocial needs.  Patient was accompanied by her daughter who lives in same complex, but different apartment.  Ms. Hollin shared she has been treated for cancer four times previously.  She indicated her level of distress a 3 and reports no concerns at this time.  She shared she is generally very laid back and does not get anxious.  Clinical Social Work briefly discussed Clinical Social Work role and [facility] support programs/services.  Clinical Social Work encouraged patient to call with any additional questions or concerns.   Lauren Mullis, MSW, LCSW, OSW-C Clinical Social Worker [facility] [phone]  

## 2014-10-13 NOTE — Progress Notes (Signed)
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   HISTORY OF PRESENT ILLNESS::Leslie Petersen is a 80 y.o. female who is here for a consult at the courtesy of Dr. Julieanne Petersen for an opinion concerning radiation therapy as part of management of what could be a diagnosis of lung cancer or metastatic adrenocortical carcinoma.   She is seen in the office today for new right lung lesion and hypermetabolic mediastinal adenopathy. Patient has history of Left upper lung adenocarcinoma (T1 N0), status post a wedge resection, 08/15/2009 by Dr Leslie Petersen. History of Right adrenocortical carcinoma, pT2, status post a right adrenalectomy, 10/09/2009.Left adrenocortical carcinoma, status post a left adrenalectomy in 1994.Biopsy of the left paraspinous mass 10/01/2013 confirmed metastatic carcinoma consistent with metastatic adrenocortical carcinoma Excision of the left paraspinous mass at [facility] on 12/03/2013 with the pathology confirming metastatic adrenocortical carcinoma with negative surgical margins.   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    PAST SURGICAL HISTORY: Past Surgical History  Procedure Laterality Date  . Lung cancer surgery  08/2009  . Adrenal gland surgery  1990/2011  . Colonoscopy  multiple  . Malignant tumor from back  June 2015    at [facility] "related to adrenal gland cancer" per pt  .  Esophagogastroduodenoscopy    . Cataract extraction, bilateral Bilateral   . Esophagogastroduodenoscopy N/A 09/16/2014    Procedure: ESOPHAGOGASTRODUODENOSCOPY (EGD);  Surgeon: Leslie Mayer, MD;  Location: [facility];  Service: Endoscopy;  Laterality: N/A;  . Hot hemostasis N/A 09/16/2014    Procedure: HOT HEMOSTASIS (ARGON PLASMA COAGULATION/BICAP);  Surgeon: Leslie Mayer, MD;  Location: [facility];  Service: Endoscopy;  Laterality: N/A;    FAMILY HISTORY: family history is not on file.  SOCIAL HISTORY:  reports that she quit smoking about 25 years ago. She has never used smokeless tobacco. She reports that she does not drink alcohol or use illicit drugs.  ALLERGIES: Mirabegron and Urabeth  MEDICATIONS:  Current Outpatient Prescriptions  Medication Sig Dispense Refill  . aspirin EC 81 MG tablet Take 1 tablet (81 mg total) by mouth at bedtime. Stop and restart on September 30, 2014    . Calcium Carbonate-Vit D-Min (CALCIUM 1200 PO) Take 600 mg by mouth daily.     . cholecalciferol (VITAMIN D) 1000 UNITS tablet Take 1,000 Units by mouth daily.    Marland Kitchen docusate sodium (COLACE) 100 MG capsule Take 100-200 mg by mouth daily as needed for mild constipation.    . fish oil-omega-3 fatty acids 1000 MG capsule Take 1 g by mouth daily.    . fludrocortisone (FLORINEF) 0.1 MG tablet Take 0.1 mg by mouth daily at 12 noon.     . hydrocortisone (CORTEF) 10 MG tablet Take 10 mg by mouth daily with breakfast.     . Multiple Vitamins-Minerals (MULTIVITAMIN WITH MINERALS) tablet Take 1 tablet by mouth daily.    . pantoprazole (PROTONIX) 20 MG tablet Take 1 tablet (20 mg total) by mouth daily. 30 tablet 11  .  polycarbophil (FIBERCON) 625 MG tablet Take 1,250 mg by mouth daily as needed for mild constipation.     . TRAVATAN Z 0.004 % SOLN ophthalmic solution Place 1 drop into both eyes at bedtime.     . Vitamins-Lipotropics (LIPO-FLAVONOID PLUS PO) Take 1 tablet by mouth daily as needed (ringing in ears).       No current facility-administered medications for this encounter.    REVIEW OF SYSTEMS:  A 15 point review of systems is documented in the electronic medical record. This was obtained by the nursing staff. However, I reviewed this with the patient to discuss relevant findings and make appropriate changes.  On steroids since 2011, due to adrenal gland issues. Reports hot flashes. No significant breathing issues or swallowing difficulties   PHYSICAL EXAM: Vitals - 1 value per visit 2/99/2426  SYSTOLIC 834  DIASTOLIC 67  Pulse 92  Temperature 98.6  Respirations 18  Weight (lb) 120.9  Height   BMI 20.74  VISIT REPORT    Lungs are clear. Heart has regular rate and rhythm. No palpable cervical, supraclavicular, or axillary adenopathy. Scar from previous lung surgery on left side. Motor strength 5 out of 5 in the proximal and distal muscle groups in the upper lower extremities     ECOG = 1  1 - Symptomatic but completely ambulatory (Restricted in physically strenuous activity but ambulatory and able to carry out work of a light or sedentary nature. For example, light housework, office work)  LABORATORY DATA:  Lab Results  Component Value Date   WBC 7.4 06/12/2014   HGB 14.6 06/12/2014   HCT 43.0 06/12/2014   MCV 97.1 06/12/2014   PLT 221 06/12/2014   NEUTROABS 5.5 06/12/2014   Lab Results  Component Value Date   NA 143 09/28/2014   K 3.7 09/28/2014   CL 107 06/12/2014   CO2 29 09/28/2014   GLUCOSE 73 09/28/2014   CREATININE 0.6 09/28/2014   CALCIUM 9.2 09/28/2014      RADIOGRAPHY: Ct Chest W Contrast  09/28/2014   CLINICAL DATA:  Left upper lobe lung adenocarcinoma status post resection. Bilateral adrenalectomy for adrenal cortical carcinoma 1994 and in 2011. Subsequent encounter.  EXAM: CT CHEST, ABDOMEN, AND PELVIS WITH CONTRAST  TECHNIQUE: Multidetector CT imaging of the chest, abdomen and pelvis was performed following the standard protocol during bolus administration  of intravenous contrast.  CONTRAST:  123m OMNIPAQUE IOHEXOL 300 MG/ML  SOLN  COMPARISON:  Chest CT 06/12/2014, PET-CT 10/13/2013 an abdominal pelvic CT 09/04/2013.  FINDINGS: CT CHEST FINDINGS  Mediastinum/Nodes: There are stable small AP window, right paratracheal and right hilar lymph nodes, not pathologically enlarged. No axillary adenopathy. The thyroid gland and trachea demonstrate no significant findings. There is a moderate size hiatal hernia. The heart size is normal. There is no pericardial effusion.There is diffuse atherosclerosis of the aorta, great vessels and coronary arteries.  Lungs/Pleura: There is no pleural effusion.Stable postsurgical changes in the left hemithorax status post left upper lobe wedge resection. Progressive enlargement of sub solid right upper lobe nodule, measuring approximately 10 mm on image 22. This remains worrisome for adenocarcinoma. Small ground-glass nodule more superiorly in the right upper lobe on image number 16 is stable. There are no suspicious pulmonary nodules on the left. Moderate emphysematous changes are again noted.  Musculoskeletal/Chest wall: No chest wall mass or suspicious osseous findings. Chronic lower thoracic compression deformities and partially calcified disc protrusions are unchanged.  CT ABDOMEN AND PELVIS FINDINGS  Hepatobiliary: The liver is normal  in density without focal abnormality. No evidence of gallstones, gallbladder wall thickening or biliary dilatation.  Pancreas: Unremarkable. No pancreatic ductal dilatation or surrounding inflammatory changes.  Spleen: Normal in size without focal abnormality.  Adrenals/Urinary Tract: There are stable postsurgical changes from previous bilateral adrenalectomy. Soft tissue nodule in the left adrenalectomy bed is stable, measuring 1.6 x 1.0 cm on image 55. This may be related to volume averaging with the pancreatic tail.The kidneys appear stable with bilateral cortical and renal sinus cysts. No  hydronephrosis or urinary tract calculus identified. The bladder appears unremarkable.  Stomach/Bowel: No evidence of bowel wall thickening, distention or surrounding inflammatory change.A moderate size hiatal hernia as noted above. There are diverticular changes throughout the colon. The appendix appears normal.  Vascular/Lymphatic: There are no enlarged abdominal or pelvic lymph nodes. Stable aortoiliac atherosclerosis.  Reproductive: The uterus and ovaries appear normal. Pelvic floor laxity suspected.  Other: No evidence of abdominal wall mass or hernia.  Musculoskeletal: No acute or significant osseous findings. Scattered endplate degenerative changes in the lumbar spine are stable. There is mild residual asymmetric soft tissue density inferiorly in the left erector spinae muscle, but no residual discrete mass or abnormal enhancement.  IMPRESSION: 1. Progressive enlargement of sub solid right upper lobe pulmonary nodule worrisome for adenocarcinoma. 2. Otherwise stable appearance of the chest status post wedge resection in the left upper lobe for lung cancer. No evidence of thoracic metastatic disease. 3. No discrete residual soft tissue mass identified within the left erector spinae muscle. 4. Otherwise stable abdominal pelvic CT status post bilateral adrenalectomy.   Electronically Signed   By: Richardean Sale M.D.   On: 09/28/2014 10:37   Ct Abdomen Pelvis W Contrast  09/28/2014   CLINICAL DATA:  Left upper lobe lung adenocarcinoma status post resection. Bilateral adrenalectomy for adrenal cortical carcinoma 1994 and in 2011. Subsequent encounter.  EXAM: CT CHEST, ABDOMEN, AND PELVIS WITH CONTRAST  TECHNIQUE: Multidetector CT imaging of the chest, abdomen and pelvis was performed following the standard protocol during bolus administration of intravenous contrast.  CONTRAST:  16m OMNIPAQUE IOHEXOL 300 MG/ML  SOLN  COMPARISON:  Chest CT 06/12/2014, PET-CT 10/13/2013 an abdominal pelvic CT 09/04/2013.   FINDINGS: CT CHEST FINDINGS  Mediastinum/Nodes: There are stable small AP window, right paratracheal and right hilar lymph nodes, not pathologically enlarged. No axillary adenopathy. The thyroid gland and trachea demonstrate no significant findings. There is a moderate size hiatal hernia. The heart size is normal. There is no pericardial effusion.There is diffuse atherosclerosis of the aorta, great vessels and coronary arteries.  Lungs/Pleura: There is no pleural effusion.Stable postsurgical changes in the left hemithorax status post left upper lobe wedge resection. Progressive enlargement of sub solid right upper lobe nodule, measuring approximately 10 mm on image 22. This remains worrisome for adenocarcinoma. Small ground-glass nodule more superiorly in the right upper lobe on image number 16 is stable. There are no suspicious pulmonary nodules on the left. Moderate emphysematous changes are again noted.  Musculoskeletal/Chest wall: No chest wall mass or suspicious osseous findings. Chronic lower thoracic compression deformities and partially calcified disc protrusions are unchanged.  CT ABDOMEN AND PELVIS FINDINGS  Hepatobiliary: The liver is normal in density without focal abnormality. No evidence of gallstones, gallbladder wall thickening or biliary dilatation.  Pancreas: Unremarkable. No pancreatic ductal dilatation or surrounding inflammatory changes.  Spleen: Normal in size without focal abnormality.  Adrenals/Urinary Tract: There are stable postsurgical changes from previous bilateral adrenalectomy. Soft tissue nodule in the left adrenalectomy bed  is stable, measuring 1.6 x 1.0 cm on image 55. This may be related to volume averaging with the pancreatic tail.The kidneys appear stable with bilateral cortical and renal sinus cysts. No hydronephrosis or urinary tract calculus identified. The bladder appears unremarkable.  Stomach/Bowel: No evidence of bowel wall thickening, distention or surrounding  inflammatory change.A moderate size hiatal hernia as noted above. There are diverticular changes throughout the colon. The appendix appears normal.  Vascular/Lymphatic: There are no enlarged abdominal or pelvic lymph nodes. Stable aortoiliac atherosclerosis.  Reproductive: The uterus and ovaries appear normal. Pelvic floor laxity suspected.  Other: No evidence of abdominal wall mass or hernia.  Musculoskeletal: No acute or significant osseous findings. Scattered endplate degenerative changes in the lumbar spine are stable. There is mild residual asymmetric soft tissue density inferiorly in the left erector spinae muscle, but no residual discrete mass or abnormal enhancement.  IMPRESSION: 1. Progressive enlargement of sub solid right upper lobe pulmonary nodule worrisome for adenocarcinoma. 2. Otherwise stable appearance of the chest status post wedge resection in the left upper lobe for lung cancer. No evidence of thoracic metastatic disease. 3. No discrete residual soft tissue mass identified within the left erector spinae muscle. 4. Otherwise stable abdominal pelvic CT status post bilateral adrenalectomy.   Electronically Signed   By: Richardean Sale M.D.   On: 09/28/2014 10:37   Nm Pet Image Restag (ps) Skull Base To Thigh  10/11/2014   CLINICAL DATA:  Subsequent treatment strategy for lung cancer. Enlarging right lung nodule. Bilateral adrenalectomy leads.  EXAM: NUCLEAR MEDICINE PET SKULL BASE TO THIGH  TECHNIQUE: 6.4 mCi F-18 FDG was injected intravenously. Full-ring PET imaging was performed from the skull base to thigh after the radiotracer. CT data was obtained and used for attenuation correction and anatomic localization.  FASTING BLOOD GLUCOSE:  Value: 91 mg/dl  COMPARISON:  10/13/2013, CT 09/28/2014  FINDINGS: NECK  No hypermetabolic lymph nodes in the neck.  CHEST  11 mm nodule in the right upper lobe has associated metabolic activity SUV max 0.5.  There is hypermetabolic AP window node measuring 10  mm short axis SUV max equals 6. 5. The metabolic activity of is increased from comparison PET-CT of 10/13/2013 (SUV max 3 0). However the size of lymph node is similar measuring 10 mm compared to 10 mm.  ABDOMEN/PELVIS  Bilateral adrenal activities. No abnormal metabolic activity liver. No hypermetabolic abdominal pelvic nodes.  SKELETON  No focal hypermetabolic activity to suggest skeletal metastasis.  IMPRESSION: 1. Hypermetabolic right upper lobe pulmonary nodule is concerning for bronchogenic carcinoma. 2. Hypermetabolic AP window lymph node is increased in metabolic activity but similar in size to 10/13/2013. Finding is concerning for lung cancer recurrence.aa   Electronically Signed   By: Suzy Bouchard M.D.   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 PET positive mediastinal and right upper lung legion. Pt will likely proceed EBUS to obtain tissue with Dr. Servando Snare.   PLAN: Pt will be seen after results of her biopsy are complete.   I spent 45 minutes minutes face to face with the patient and more than 50% of that time was spent in counseling and/or coordination of care.  This document serves as a record of services personally performed by Gery Pray, MD. It was created on his behalf by Leslie Harman, a trained medical scribe. The creation of this record is  based on the scribe's personal observations and the provider's statements to them. This document has been checked and approved by the attending provider.    ------------------------------------------------  Blair Promise, PhD, MD

## 2014-10-13 NOTE — Therapy (Signed)
[place], [place], [address] Phone: [phone]   Fax:  [phone]  Physical Therapy Evaluation  Patient Details  Name: Leslie Petersen MRN: [id number] Date of Birth: January 29, 1935 Referring Provider:  Gery Pray, MD  Encounter Date: 10/13/2014      PT End of Session - 10/13/14 1736    Visit Number 1   Number of Visits 1   PT Start Time 1505   PT Stop Time 1535   PT Time Calculation (min) 30 min   Activity Tolerance Patient tolerated treatment well   Behavior During Therapy [facility] for tasks assessed/performed      Past Medical History  Diagnosis Date  . Allergy     seasonal  . Lung cancer, upper lobe, left 2011    adenocarcinoma resected   . Cataract   . Glaucoma   . Osteoporosis   . Adenomatous colon polyp   . 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 Lung cancer surgery  08/2009  . Adrenal gland surgery  1990/2011  . Colonoscopy  multiple  . Malignant tumor from back  June 2015    at [facility] "related to adrenal gland cancer" per pt  . Esophagogastroduodenoscopy    . Cataract extraction, bilateral Bilateral   . Esophagogastroduodenoscopy N/A 09/16/2014    Procedure: ESOPHAGOGASTRODUODENOSCOPY (EGD);  Surgeon: [name], MD;  Location: [facility];  Service: Endoscopy;  Laterality: N/A;  . Hot hemostasis N/A 09/16/2014    Procedure: HOT HEMOSTASIS (ARGON PLASMA COAGULATION/BICAP);  Surgeon: [name], MD;  Location: [facility];  Service: Endoscopy;  Laterality: N/A;    There were no vitals filed for this visit.  Visit Diagnosis:  Stooped posture - Plan: PT plan of care cert/re-cert  Pain aggravated by walking - Plan: PT plan of care cert/re-cert  At risk for falls - Plan: PT plan of care cert/re-cert      Subjective Assessment - 10/13/14 1723    Subjective h/o low back pain worse  with walking and better with sitting   Patient is accompained by: Family member  daughter   Pertinent History Patient was being followed due to h/o cancer; follow-up CT chest showed right upper lobe enlarging nodule.  Non-small cell lung cancer T1, N0 s/p wedge resection 2011; adenocortical cancer s/p resections in 1994 and 2011; ex-smoker (quit 1990); osteoporosis   Currently in Pain? Yes   Pain Score 0-No pain  but up to 6/10   Pain Location Back   Pain Orientation Lower   Pain Type Chronic pain   Aggravating Factors  walking   Pain Relieving Factors sitting   Effect of Pain on Daily Activities has to use a cart at any store for support; unable to stand up straight            [facility] PT Assessment - 10/13/14 0001    Assessment   Medical Diagnosis left paraspinal metastatic malignancy consistent with adrenocortical carcinoma   Onset Date 09/28/14   Precautions   Precautions Other (comment)   Precaution Comments MARKED OSTEOPOROSIS; cancer precautions   Restrictions   Weight Bearing Restrictions No   Balance Screen   Has the patient fallen in the past 6 months No   Has the patient had a decrease in activity level because of a fear of falling?  No   Is the patient reluctant to leave their home because  of a fear of falling?  No   Home Environment   Living Enviornment Private residence   Living Arrangements Alone  but daughter one block away   Type of Home Other(Comment)  townhome   Home Layout Two level;Able to live on main level with bedroom/bathroom   Prior Function   Level of Independence Independent with basic ADLs;Independent with homemaking with ambulation;Independent with gait   Leisure no regular exercise   Functional Tests   Functional tests Sit to Stand   Sit to Stand   Comments unable to perform:  needs UE support to stand from sitting   Posture/Postural Control   Posture/Postural Control Postural limitations   Postural Limitations Rounded Shoulders;Forward  head;Increased thoracic kyphosis;Decreased lumbar lordosis   Posture Comments significantly rounded back from thoracic through lumbar levels; unable to strighten up fully  says her back pain tends to tilt her forward; affects balanc   ROM / Strength   AROM / PROM / Strength AROM;Strength   AROM   Overall AROM  Unable to assess;Due to precautions  trunk AROM not tested fully due to osteoporosis   Overall AROM Comments standing back extension:  just to neutral at low back and still with forward curve then at thoracic spine   Strength   Overall Strength Unable to assess;Due to precautions   Ambulation/Gait   Ambulation/Gait Yes   Ambulation/Gait Assistance 6: Modified independent (Device/Increase time)   Assistive device Other (Comment)  hasn't used them, but has both a straight and quad cane   Balance   Balance Assessed No  didn't want to have her flex forward for standing test                           PT Education - 10/13/14 1735    Education provided Yes   Education Details posture, breathing, walking, energy conservation, physical therapy information including availability of therapy for osteoporosis   Person(s) Educated Patient;Child(ren)   Methods Explanation;Handout   Comprehension Verbalized understanding               Lung Clinic Goals - 10/13/14 1742    Patient will be able to verbalize understanding of the benefit of exercise to decrease fatigue.   Status Achieved   Patient will be able to verbalize the importance of posture.   Status Achieved   Patient will be able to demonstrate diaphragmatic breathing for improved lung function.   Status Achieved   Patient will be able to verbalize understanding of the role of physical therapy to prevent functional decline and who to contact if physical therapy is needed.   Status Achieved             Plan - 10/13/14 1736    Clinical Impression Statement Patient with very significant osteoporosis  keeping her from standing erect and affection her safety with ambulation, as she feels her back pain and forward posture tend to tilt her forward.  When she stand still, she wants to have upper extremity support.  She is unable to stand from sitting without using arms to push.  She also has back pain.   Pt will benefit from skilled therapeutic intervention in order to improve on the following deficits Postural dysfunction;Pain;Decreased mobility;Decreased strength   Rehab Potential Good   PT Frequency One time visit  but suggest later therapy for osteoporosis and for safe gait with canes   PT Treatment/Interventions Patient/family education   PT Next Visit Plan Assess further  for osteoporosis and begin treatment for same; check canes she bought at a yardsale, adjust them as able, and instruct in using at least one.   PT Home Exercise Plan see education section   Recommended Other Services physical therapy for osteoporosis   Consulted and Agree with Plan of Care Patient;Family member/caregiver          G-Codes - 11/05/14 1742    Functional Assessment Tool Used clinical judgement   Functional Limitation Changing and maintaining body position   Changing and Maintaining Body Position Current Status [phone]) At least 60 percent but less than 80 percent impaired, limited or restricted   Changing and Maintaining Body Position Goal Status (H4643) At least 40 percent but less than 60 percent impaired, limited or restricted       Problem List Patient Active Problem List   Diagnosis Date Noted  . 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 Osteoporosis 02/17/2014  . Atypical chest pain 02/17/2014  . Hypotension 02/17/2014  . Adrenocortical carcinoma 02/17/2014  . Adenocarcinoma, lung 02/17/2014  . Chest pain 02/17/2014  . Personal history adenomatous of colonic polyps 10/24/2011    Blossie Raffel 11-05-14, 5:44 PM  [place], [place], [address] Phone: [phone]   Fax:  [place], PT 05-Nov-2014 5:44 PM

## 2014-10-13 NOTE — Progress Notes (Signed)
Leslie Petersen [address]       [place] [address]             [phone]                    Leslie Petersen Medical Record #[id number] Date of Birth: 1934/08/09  Referring: Ladell Pier, MD Primary Care: Tawanna Solo, MD  Chief Complaint:  Lung mass  History of Present Illness:   Leslie Petersen 80 y.o. female is seen in the office  today for new right lung lesion and hypermetabolic mediastinal adenopathy. Patient has history of  Left upper lung adenocarcinoma (T1 N0), status post a wedge resection, 08/15/2009 by Dr Arlyce Dice. History of Right adrenocortical carcinoma, pT2, status post a right adrenalectomy, 10/09/2009.Left adrenocortical carcinoma, status post a left adrenalectomy in 1994.Biopsy of the left paraspinous mass 10/01/2013 confirmed metastatic carcinoma consistent with metastatic adrenocortical carcinoma Excision of the left paraspinous mass at [facility] on 12/03/2013 with the pathology confirming metastatic adrenocortical carcinoma with negative surgical margins.   Current Activity/ Functional Status:  Patient is independent with mobility/ambulation, transfers, ADL's, IADL's.   Zubrod Score: At the time of surgery this patient's most appropriate activity status/level should be described as: '[]'$     0    Normal activity, no symptoms '[x]'$     1    Restricted in physical strenuous activity but ambulatory, able to do out light work '[]'$     2    Ambulatory and capable of self care, unable to do work activities, up and about               >50 % of waking hours                              '[]'$     3    Only limited self care, in bed greater than 50% of waking hours '[]'$     4    Completely disabled, no self care, confined to bed or chair '[]'$     5    Moribund   Past Medical History  Diagnosis Date  . Allergy     seasonal  . Lung cancer, upper lobe, left 2011    adenocarcinoma resected   . Cataract   . Glaucoma   . Osteoporosis   . Adenomatous colon polyp     . 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 Lung cancer surgery  08/2009  . Adrenal gland surgery  1990/2011  . Colonoscopy  multiple  . Malignant tumor from back  June 2015    at [facility] "related to adrenal gland cancer" per pt  . Esophagogastroduodenoscopy    . Cataract extraction, bilateral Bilateral   . Esophagogastroduodenoscopy N/A 09/16/2014    Procedure: ESOPHAGOGASTRODUODENOSCOPY (EGD);  Surgeon: [name], MD;  Location: [facility];  Service: Endoscopy;  Laterality: N/A;  . Hot hemostasis N/A 09/16/2014    Procedure: HOT HEMOSTASIS (ARGON PLASMA COAGULATION/BICAP);  Surgeon: [name], MD;  Location: [facility];  Service: Endoscopy;  Laterality: N/A;    No family history on file.  History   Social History  . Marital Status: Divorced    Spouse Name: N/A  . Number of Children: N/A  . Years of Education: N/A   Occupational History  . Not on file.  Social History Main Topics  . Smoking status: Former Smoker    Quit date: 02/17/1989  . Smokeless tobacco: Never Used  . Alcohol Use: No  . Drug Use: No  . Sexual Activity: Not on file   Other Topics Concern  . Not on file   Social History Narrative    History  Smoking status  . Former Smoker  . Quit date: 02/17/1989  Smokeless tobacco  . Never Used    History  Alcohol Use No     Allergies  Allergen Reactions  . Mirabegron Other (See Comments)    Patient had adverse effect to medication (patient does not want to take any overactive bladder medications).  Bernadene Bell [Bethanechol]     Current Outpatient Prescriptions  Medication Sig Dispense Refill  . aspirin EC 81 MG tablet Take 1 tablet (81 mg total) by mouth at bedtime. Stop and restart on September 30, 2014    . Calcium Carbonate-Vit D-Min (CALCIUM 1200 PO) Take 600 mg by mouth daily.     . cholecalciferol (VITAMIN D) 1000 UNITS tablet Take 1,000  Units by mouth daily.    Marland Kitchen docusate sodium (COLACE) 100 MG capsule Take 100-200 mg by mouth daily as needed for mild constipation.    . fish oil-omega-3 fatty acids 1000 MG capsule Take 1 g by mouth daily.    . fludrocortisone (FLORINEF) 0.1 MG tablet Take 0.1 mg by mouth daily at 12 noon.     . hydrocortisone (CORTEF) 10 MG tablet Take 10 mg by mouth daily with breakfast.     . Multiple Vitamins-Minerals (MULTIVITAMIN WITH MINERALS) tablet Take 1 tablet by mouth daily.    . pantoprazole (PROTONIX) 20 MG tablet Take 1 tablet (20 mg total) by mouth daily. 30 tablet 11  . polycarbophil (FIBERCON) 625 MG tablet Take 1,250 mg by mouth daily as needed for mild constipation.     . TRAVATAN Z 0.004 % SOLN ophthalmic solution Place 1 drop into both eyes at bedtime.     . Vitamins-Lipotropics (LIPO-FLAVONOID PLUS PO) Take 1 tablet by mouth daily as needed (ringing in ears).      No current facility-administered medications for this visit.      Review of Systems:     Cardiac Review of Systems: Y or N  Chest Pain [ n   ]  Resting SOB [ n  ] Exertional SOB  Blue.Reese  ]  Orthopnea Blue.Reese  ]   Pedal Edema [  n ]    Palpitations [ n ] Syncope  [ n ]   Presyncope [ n  ]  General Review of Systems: [Y] = yes [  ]=no Constitional: recent weight change Blue.Reese  ];  Wt loss over the last 3 months [   ] anorexia [  ]; fatigue [  ]; nausea [  ]; night sweats [  ]; fever [  ]; or chills [  ];          Dental: poor dentition[n  ]; Last Dentist visit:   Eye : blurred vision [  ]; diplopia [   ]; vision changes [  ];  Amaurosis fugax[  ]; Resp: cough [  ];  wheezing[  ];  hemoptysis[  ]; shortness of breath[  ]; paroxysmal nocturnal dyspnea[  ]; dyspnea on exertion[  ]; or orthopnea[  ];  GI:  gallstones[  ], vomiting[  ];  dysphagia[y  ]; melena[  ];  hematochezia [  ]; heartburn[  ];  Hx of  Colonoscopy[  ]; GU: kidney stones [  ]; hematuria[  ];   dysuria [ n ];  nocturia[  ];  history of     obstruction [  ]; urinary  frequency [ n ]             Skin: rash, swelling[  ];, hair loss[  ];  peripheral edema[  ];  or itching[  ]; Musculosketetal: myalgias[  ];  joint swelling[  ];  joint erythema[  ];  joint pain[  ];  back pain[  ];  Heme/Lymph: bruising[  ];  bleeding[  ];  anemia[  ];  Neuro: TIA[n  ];  headaches[  ];  stroke[n  ];  vertigo[  ];  seizures[ n ];   paresthesias[  ];  difficulty walking[ y ];  Psych:depression[  ]; anxiety[  ];  Endocrine: diabetes[  ];  thyroid dysfunction[  ];  Immunizations: Flu up to date [  ]; Pneumococcal up to date [  ];  Other:  Physical Exam: BP 156/67 mmHg  Pulse 92  Temp(Src) 98.6 F (37 C)  Resp 18  Wt 120 lb 14.4 oz (54.84 kg)  SpO2 92%  PHYSICAL EXAMINATION: General appearance: alert, cooperative and appears older than stated age Head: Normocephalic, without obvious abnormality, atraumatic Neck: no adenopathy, no carotid bruit, no JVD, supple, symmetrical, trachea midline and thyroid not enlarged, symmetric, no tenderness/mass/nodules Lymph nodes: Cervical, supraclavicular, and axillary nodes normal. Resp: clear to auscultation bilaterally Back: symmetric, no curvature. ROM normal. No CVA tenderness. Low left back healed incision from previos resection Cardio: regular rate and rhythm, S1, S2 normal, no murmur, click, rub or gallop GI: soft, non-tender; bowel sounds normal; no masses,  no organomegaly Extremities: extremities normal, atraumatic, no cyanosis or edema and Homans sign is negative, no sign of DVT Neurologic: Grossly normal No cervical or supraclavicular adenopathy  Diagnostic Studies & Laboratory data:     Recent Radiology Findings:   Ct Chest W Contrast/Ct Abdomen Pelvis W Contrast  09/28/2014   CLINICAL DATA:  Left upper lobe lung adenocarcinoma status post resection. Bilateral adrenalectomy for adrenal cortical carcinoma 1994 and in 2011. Subsequent encounter.  EXAM: CT CHEST, ABDOMEN, AND PELVIS WITH CONTRAST  TECHNIQUE: Multidetector  CT imaging of the chest, abdomen and pelvis was performed following the standard protocol during bolus administration of intravenous contrast.  CONTRAST:  11m OMNIPAQUE IOHEXOL 300 MG/ML  SOLN  COMPARISON:  Chest CT 06/12/2014, PET-CT 10/13/2013 an abdominal pelvic CT 09/04/2013.  FINDINGS: CT CHEST FINDINGS  Mediastinum/Nodes: There are stable small AP window, right paratracheal and right hilar lymph nodes, not pathologically enlarged. No axillary adenopathy. The thyroid gland and trachea demonstrate no significant findings. There is a moderate size hiatal hernia. The heart size is normal. There is no pericardial effusion.There is diffuse atherosclerosis of the aorta, great vessels and coronary arteries.  Lungs/Pleura: There is no pleural effusion.Stable postsurgical changes in the left hemithorax status post left upper lobe wedge resection. Progressive enlargement of sub solid right upper lobe nodule, measuring approximately 10 mm on image 22. This remains worrisome for adenocarcinoma. Small ground-glass nodule more superiorly in the right upper lobe on image number 16 is stable. There are no suspicious pulmonary nodules on the left. Moderate emphysematous changes are again noted.  Musculoskeletal/Chest wall: No chest wall mass or suspicious osseous findings. Chronic lower thoracic compression deformities and partially calcified disc protrusions are unchanged.  CT ABDOMEN AND PELVIS FINDINGS  Hepatobiliary: The liver is normal in  density without focal abnormality. No evidence of gallstones, gallbladder wall thickening or biliary dilatation.  Pancreas: Unremarkable. No pancreatic ductal dilatation or surrounding inflammatory changes.  Spleen: Normal in size without focal abnormality.  Adrenals/Urinary Tract: There are stable postsurgical changes from previous bilateral adrenalectomy. Soft tissue nodule in the left adrenalectomy bed is stable, measuring 1.6 x 1.0 cm on image 55. This may be related to volume  averaging with the pancreatic tail.The kidneys appear stable with bilateral cortical and renal sinus cysts. No hydronephrosis or urinary tract calculus identified. The bladder appears unremarkable.  Stomach/Bowel: No evidence of bowel wall thickening, distention or surrounding inflammatory change.A moderate size hiatal hernia as noted above. There are diverticular changes throughout the colon. The appendix appears normal.  Vascular/Lymphatic: There are no enlarged abdominal or pelvic lymph nodes. Stable aortoiliac atherosclerosis.  Reproductive: The uterus and ovaries appear normal. Pelvic floor laxity suspected.  Other: No evidence of abdominal wall mass or hernia.  Musculoskeletal: No acute or significant osseous findings. Scattered endplate degenerative changes in the lumbar spine are stable. There is mild residual asymmetric soft tissue density inferiorly in the left erector spinae muscle, but no residual discrete mass or abnormal enhancement.  IMPRESSION: 1. Progressive enlargement of sub solid right upper lobe pulmonary nodule worrisome for adenocarcinoma. 2. Otherwise stable appearance of the chest status post wedge resection in the left upper lobe for lung cancer. No evidence of thoracic metastatic disease. 3. No discrete residual soft tissue mass identified within the left erector spinae muscle. 4. Otherwise stable abdominal pelvic CT status post bilateral adrenalectomy.   Electronically Signed   By: Richardean Sale M.D.   On: 09/28/2014 10:37     Nm Pet Image Restag (ps) Skull Base To Thigh  10/11/2014   CLINICAL DATA:  Subsequent treatment strategy for lung cancer. Enlarging right lung nodule. Bilateral adrenalectomy leads.  EXAM: NUCLEAR MEDICINE PET SKULL BASE TO THIGH  TECHNIQUE: 6.4 mCi F-18 FDG was injected intravenously. Full-ring PET imaging was performed from the skull base to thigh after the radiotracer. CT data was obtained and used for attenuation correction and anatomic localization.   FASTING BLOOD GLUCOSE:  Value: 91 mg/dl  COMPARISON:  10/13/2013, CT 09/28/2014  FINDINGS: NECK  No hypermetabolic lymph nodes in the neck.  CHEST  11 mm nodule in the right upper lobe has associated metabolic activity SUV max 0.5.  There is hypermetabolic AP window node measuring 10 mm short axis SUV max equals 6. 5. The metabolic activity of is increased from comparison PET-CT of 10/13/2013 (SUV max 3 0). However the size of lymph node is similar measuring 10 mm compared to 10 mm.  ABDOMEN/PELVIS  Bilateral adrenal activities. No abnormal metabolic activity liver. No hypermetabolic abdominal pelvic nodes.  SKELETON  No focal hypermetabolic activity to suggest skeletal metastasis.  IMPRESSION: 1. Hypermetabolic right upper lobe pulmonary nodule is concerning for bronchogenic carcinoma. 2. Hypermetabolic AP window lymph node is increased in metabolic activity but similar in size to 10/13/2013. Finding is concerning for lung cancer recurrence.aa   Electronically Signed   By: Suzy Bouchard M.D.   On: 10/11/2014 16:08    I have independently reviewed the above radiology studies  and reviewed the findings with the patient.    Recent Lab Findings: Lab Results  Component Value Date   WBC 7.4 06/12/2014   HGB 14.6 06/12/2014   HCT 43.0 06/12/2014   PLT 221 06/12/2014   GLUCOSE 73 09/28/2014   ALT 13 06/12/2014   AST 20 06/12/2014  NA 143 09/28/2014   K 3.7 09/28/2014   CL 107 06/12/2014   CREATININE 0.6 09/28/2014   BUN 10.3 09/28/2014   CO2 29 09/28/2014   INR 1.01 10/01/2013      Assessment / Plan:  1/ new right lung lesion and hypermetabolic mediastinal adenopathy. Patient has history of  Left upper lung adenocarcinoma (T1 N0) resected 2/ history of adrenocortical carcinoma, with bilateral adenectomy and excision of left back soft tissue recurrence 11/2013   Plan EBUS and BX    may3      I  spent 45 minutes counseling the patient face to face and 50% or more the  time was spent in  counseling and coordination of care. The total time spent in the appointment was 60 minutes.  Grace Isaac MD      [address] [place] [address] Office [phone]   Beeper [phone]  10/13/2014 5:16 PM

## 2014-10-17 ENCOUNTER — Encounter (HOSPITAL_COMMUNITY): Payer: Self-pay

## 2014-10-17 ENCOUNTER — Encounter (HOSPITAL_COMMUNITY)
Admission: RE | Admit: 2014-10-17 | Discharge: 2014-10-17 | Disposition: A | Discharge status: Home / Self Care | Payer: Medicare Other | Source: Ambulatory Visit | Attending: Cardiothoracic Surgery | Admitting: Cardiothoracic Surgery

## 2014-10-17 ENCOUNTER — Ambulatory Visit (HOSPITAL_COMMUNITY)
Admission: RE | Admit: 2014-10-17 | Discharge: 2014-10-17 | Disposition: A | Discharge status: Home / Self Care | Payer: Medicare Other | Source: Ambulatory Visit | Attending: Cardiothoracic Surgery | Admitting: Cardiothoracic Surgery

## 2014-10-17 ENCOUNTER — Other Ambulatory Visit: Payer: Self-pay | Admitting: *Deleted

## 2014-10-17 ENCOUNTER — Other Ambulatory Visit (HOSPITAL_COMMUNITY): Payer: Self-pay | Admitting: *Deleted

## 2014-10-17 VITALS — BP 157/83 | HR 82 | Temp 97.6°F | Resp 18 | Ht 63.0 in | Wt 120.4 lb

## 2014-10-17 DIAGNOSIS — R05 Cough: Secondary | ICD-10-CM | POA: Insufficient documentation

## 2014-10-17 DIAGNOSIS — R590 Localized enlarged lymph nodes: Secondary | ICD-10-CM

## 2014-10-17 DIAGNOSIS — Z01818 Encounter for other preprocedural examination: Secondary | ICD-10-CM | POA: Diagnosis present

## 2014-10-17 DIAGNOSIS — Z85118 Personal history of other malignant neoplasm of bronchus and lung: Secondary | ICD-10-CM | POA: Diagnosis not present

## 2014-10-17 HISTORY — DX: Xerosis cutis: L85.3

## 2014-10-17 HISTORY — DX: Other specified diseases of esophagus: K22.8

## 2014-10-17 HISTORY — DX: Constipation, unspecified: K59.00

## 2014-10-17 HISTORY — DX: Esophageal polyp: K22.81

## 2014-10-17 HISTORY — DX: Unspecified osteoarthritis, unspecified site: M19.90

## 2014-10-17 HISTORY — DX: Frequency of micturition: R35.0

## 2014-10-17 HISTORY — DX: Personal history of other diseases of the digestive system: Z87.19

## 2014-10-17 LAB — CBC
HCT: 41.8 % (ref 36.0–46.0)
Hemoglobin: 14 g/dL (ref 12.0–15.0)
MCH: 32.6 pg (ref 26.0–34.0)
MCHC: 33.5 g/dL (ref 30.0–36.0)
MCV: 97.2 fL (ref 78.0–100.0)
Platelets: 233 10*3/uL (ref 150–400)
RBC: 4.3 MIL/uL (ref 3.87–5.11)
RDW: 13.3 % (ref 11.5–15.5)
WBC: 7.5 10*3/uL (ref 4.0–10.5)

## 2014-10-17 LAB — COMPREHENSIVE METABOLIC PANEL
ALT: 18 U/L (ref 14–54)
AST: 20 U/L (ref 15–41)
Albumin: 3.7 g/dL (ref 3.5–5.0)
Alkaline Phosphatase: 47 U/L (ref 38–126)
Anion gap: 8 (ref 5–15)
BUN: 15 mg/dL (ref 6–20)
CO2: 25 mmol/L (ref 22–32)
Calcium: 9.2 mg/dL (ref 8.9–10.3)
Chloride: 108 mmol/L (ref 101–111)
Creatinine, Ser: 0.47 mg/dL (ref 0.44–1.00)
GFR calc Af Amer: >60 mL/min (ref >60)
GFR calc non Af Amer: >60 mL/min (ref >60)
Glucose, Bld: 88 mg/dL (ref 70–99)
Potassium: 3.7 mmol/L (ref 3.5–5.1)
Sodium: 141 mmol/L (ref 135–145)
Total Bilirubin: 0.9 mg/dL (ref 0.3–1.2)
Total Protein: 7 g/dL (ref 6.5–8.1)

## 2014-10-17 LAB — PROTIME-INR
INR: 1.04 (ref 0.00–1.49)
Prothrombin Time: 13.7 seconds (ref 11.6–15.2)

## 2014-10-17 LAB — APTT: aPTT: 30 seconds (ref 24–37)

## 2014-10-17 NOTE — Pre-Procedure Instructions (Signed)
Leslie Petersen  10/17/2014   Your procedure is scheduled on:  Tuesday, Oct 18, 2014 at 7:30 AM.   Report to [facility] Entrance "A" Admitting Office at 5:30 AM.   Call this number if you have problems the morning of surgery: [phone]   Remember:   Do not eat food or drink liquids after midnight tonight.   Take these medicines the morning of surgery with A SIP OF WATER: Fexofenadine (Allegra), Hydrocortisone (Cortef), Pantoprazole (Protonix)   Do not wear jewelry, make-up or nail polish.  Do not wear lotions, powders, or perfumes. You may wear deodorant.  Do not shave 48 hours prior to surgery.   Do not bring valuables to the hospital.  [facility] is not responsible                  for any belongings or valuables.               Contacts, dentures or bridgework may not be worn into surgery.  Leave suitcase in the car. After surgery it may be brought to your room.  For patients admitted to the hospital, discharge time is determined by your                treatment team.               Patients discharged the day of surgery will not be allowed to drive home.    Special Instructions: [place] - Preparing for Surgery  Before surgery, you can play an important role.  Because skin is not sterile, your skin needs to be as free of germs as possible.  You can reduce the number of germs on you skin by washing with CHG (chlorahexidine gluconate) soap before surgery.  CHG is an antiseptic cleaner which kills germs and bonds with the skin to continue killing germs even after washing.  Please DO NOT use if you have an allergy to CHG or antibacterial soaps.  If your skin becomes reddened/irritated stop using the CHG and inform your nurse when you arrive at Short Stay.  Do not shave (including legs and underarms) for at least 48 hours prior to the first CHG shower.  You may shave your face.  Please follow these instructions carefully:   1.  Shower with CHG Soap the night before surgery  and the                                morning of Surgery.  2.  If you choose to wash your hair, wash your hair first as usual with your       normal shampoo.  3.  After you shampoo, rinse your hair and body thoroughly to remove the                      Shampoo.  4.  Use CHG as you would any other liquid soap.  You can apply chg directly       to the skin and wash gently with scrungie or a clean washcloth.  5.  Apply the CHG Soap to your body ONLY FROM THE NECK DOWN.        Do not use on open wounds or open sores.  Avoid contact with your eyes, ears, mouth and genitals (private parts).  Wash genitals (private parts) with your normal soap.  6.  Wash thoroughly, paying special attention to the  area where your surgery        will be performed.  7.  Thoroughly rinse your body with warm water from the neck down.  8.  DO NOT shower/wash with your normal soap after using and rinsing off       the CHG Soap.  9.  Pat yourself dry with a clean towel.            10.  Wear clean pajamas.            11.  Place clean sheets on your bed the night of your first shower and do not        sleep with pets.  Day of Surgery  Do not apply any lotions the morning of surgery.  Please wear clean clothes to the hospital.     Please read over the following fact sheets that you were given: Pain Booklet, Coughing and Deep Breathing and Surgical Site Infection Prevention

## 2014-10-17 NOTE — Progress Notes (Signed)
No pre-op orders in EPIC. Called Dr. Everrett Coombe office and left message on [employer] (nurse).

## 2014-10-18 ENCOUNTER — Encounter (HOSPITAL_COMMUNITY)
Admission: RE | Disposition: A | Discharge status: Home / Self Care | Payer: Self-pay | Source: Ambulatory Visit | Attending: Cardiothoracic Surgery

## 2014-10-18 ENCOUNTER — Ambulatory Visit (HOSPITAL_COMMUNITY): Payer: Medicare Other | Admitting: Certified Registered Nurse Anesthetist

## 2014-10-18 ENCOUNTER — Ambulatory Visit (HOSPITAL_COMMUNITY)
Admission: RE | Admit: 2014-10-18 | Discharge: 2014-10-18 | Disposition: A | Discharge status: Home / Self Care | Payer: Medicare Other | Source: Ambulatory Visit | Attending: Cardiothoracic Surgery | Admitting: Cardiothoracic Surgery

## 2014-10-18 ENCOUNTER — Encounter (HOSPITAL_COMMUNITY): Payer: Self-pay | Admitting: *Deleted

## 2014-10-18 DIAGNOSIS — Z888 Allergy status to other drugs, medicaments and biological substances status: Secondary | ICD-10-CM | POA: Insufficient documentation

## 2014-10-18 DIAGNOSIS — Z902 Acquired absence of lung [part of]: Secondary | ICD-10-CM | POA: Diagnosis not present

## 2014-10-18 DIAGNOSIS — I898 Other specified noninfective disorders of lymphatic vessels and lymph nodes: Secondary | ICD-10-CM | POA: Insufficient documentation

## 2014-10-18 DIAGNOSIS — Z7982 Long term (current) use of aspirin: Secondary | ICD-10-CM | POA: Diagnosis not present

## 2014-10-18 DIAGNOSIS — C7989 Secondary malignant neoplasm of other specified sites: Secondary | ICD-10-CM | POA: Insufficient documentation

## 2014-10-18 DIAGNOSIS — K449 Diaphragmatic hernia without obstruction or gangrene: Secondary | ICD-10-CM | POA: Insufficient documentation

## 2014-10-18 DIAGNOSIS — C3412 Malignant neoplasm of upper lobe, left bronchus or lung: Secondary | ICD-10-CM | POA: Diagnosis not present

## 2014-10-18 DIAGNOSIS — R590 Localized enlarged lymph nodes: Secondary | ICD-10-CM

## 2014-10-18 DIAGNOSIS — M810 Age-related osteoporosis without current pathological fracture: Secondary | ICD-10-CM | POA: Diagnosis not present

## 2014-10-18 DIAGNOSIS — R222 Localized swelling, mass and lump, trunk: Secondary | ICD-10-CM | POA: Diagnosis not present

## 2014-10-18 DIAGNOSIS — Z87891 Personal history of nicotine dependence: Secondary | ICD-10-CM | POA: Diagnosis not present

## 2014-10-18 DIAGNOSIS — Z86010 Personal history of colonic polyps: Secondary | ICD-10-CM | POA: Diagnosis not present

## 2014-10-18 HISTORY — PX: VIDEO BRONCHOSCOPY WITH ENDOBRONCHIAL ULTRASOUND: SHX6177

## 2014-10-18 SURGERY — BRONCHOSCOPY, WITH EBUS
Anesthesia: General

## 2014-10-18 MED ORDER — PHENYLEPHRINE HCL 10 MG/ML IJ SOLN
INTRAMUSCULAR | Status: DC | PRN
  Administered 2014-10-18: 40 ug via INTRAVENOUS
  Administered 2014-10-18: 80 ug via INTRAVENOUS
  Administered 2014-10-18: 40 ug via INTRAVENOUS
  Administered 2014-10-18: 120 ug via INTRAVENOUS
  Administered 2014-10-18 (×2): 40 ug via INTRAVENOUS

## 2014-10-18 MED ORDER — PHENYLEPHRINE 40 MCG/ML (10ML) SYRINGE FOR IV PUSH (FOR BLOOD PRESSURE SUPPORT)
PREFILLED_SYRINGE | INTRAVENOUS | Status: AC
  Filled 2014-10-18: qty 10

## 2014-10-18 MED ORDER — FENTANYL CITRATE (PF) 100 MCG/2ML IJ SOLN
INTRAMUSCULAR | Status: DC | PRN
  Administered 2014-10-18: 25 ug via INTRAVENOUS

## 2014-10-18 MED ORDER — NEOSTIGMINE METHYLSULFATE 10 MG/10ML IV SOLN
INTRAVENOUS | Status: DC | PRN
  Administered 2014-10-18: 2 mg via INTRAVENOUS

## 2014-10-18 MED ORDER — ROCURONIUM BROMIDE 50 MG/5ML IV SOLN
INTRAVENOUS | Status: AC
  Filled 2014-10-18: qty 1

## 2014-10-18 MED ORDER — ROCURONIUM BROMIDE 100 MG/10ML IV SOLN
INTRAVENOUS | Status: DC | PRN
  Administered 2014-10-18: 25 mg via INTRAVENOUS

## 2014-10-18 MED ORDER — PROPOFOL 10 MG/ML IV BOLUS
INTRAVENOUS | Status: AC
  Filled 2014-10-18: qty 20

## 2014-10-18 MED ORDER — DEXTROSE 5 % IV SOLN
10.0000 mg | INTRAVENOUS | Status: DC | PRN
  Administered 2014-10-18: 20 ug/min via INTRAVENOUS

## 2014-10-18 MED ORDER — DEXAMETHASONE SODIUM PHOSPHATE 4 MG/ML IJ SOLN
INTRAMUSCULAR | Status: AC
  Filled 2014-10-18: qty 1

## 2014-10-18 MED ORDER — HYDROMORPHONE HCL 1 MG/ML IJ SOLN: 0.2500 mg | INTRAMUSCULAR | Status: DC | PRN

## 2014-10-18 MED ORDER — LIDOCAINE HCL (CARDIAC) 20 MG/ML IV SOLN
INTRAVENOUS | Status: AC
  Filled 2014-10-18: qty 10

## 2014-10-18 MED ORDER — SODIUM CHLORIDE 0.9 % IJ SOLN
INTRAMUSCULAR | Status: AC
  Filled 2014-10-18: qty 10

## 2014-10-18 MED ORDER — NEOSTIGMINE METHYLSULFATE 10 MG/10ML IV SOLN
INTRAVENOUS | Status: AC
  Filled 2014-10-18: qty 2

## 2014-10-18 MED ORDER — LACTATED RINGERS IV SOLN
INTRAVENOUS | Status: DC | PRN
  Administered 2014-10-18: 07:00:00 via INTRAVENOUS

## 2014-10-18 MED ORDER — ONDANSETRON HCL 4 MG/2ML IJ SOLN
INTRAMUSCULAR | Status: DC | PRN
  Administered 2014-10-18: 4 mg via INTRAVENOUS

## 2014-10-18 MED ORDER — DEXAMETHASONE SODIUM PHOSPHATE 4 MG/ML IJ SOLN
INTRAMUSCULAR | Status: DC | PRN
  Administered 2014-10-18: 4 mg via INTRAVENOUS

## 2014-10-18 MED ORDER — ONDANSETRON HCL 4 MG/2ML IJ SOLN
INTRAMUSCULAR | Status: AC
  Filled 2014-10-18: qty 2

## 2014-10-18 MED ORDER — FENTANYL CITRATE (PF) 250 MCG/5ML IJ SOLN
INTRAMUSCULAR | Status: AC
  Filled 2014-10-18: qty 5

## 2014-10-18 MED ORDER — GLYCOPYRROLATE 0.2 MG/ML IJ SOLN
INTRAMUSCULAR | Status: AC
  Filled 2014-10-18: qty 3

## 2014-10-18 MED ORDER — GLYCOPYRROLATE 0.2 MG/ML IJ SOLN
INTRAMUSCULAR | Status: DC | PRN
  Administered 2014-10-18: .2 mg via INTRAVENOUS
  Administered 2014-10-18: .1 mg via INTRAVENOUS

## 2014-10-18 MED ORDER — LIDOCAINE HCL (CARDIAC) 20 MG/ML IV SOLN
INTRAVENOUS | Status: DC | PRN
  Administered 2014-10-18: 60 mg via INTRAVENOUS

## 2014-10-18 MED ORDER — MIDAZOLAM HCL 2 MG/2ML IJ SOLN
INTRAMUSCULAR | Status: AC
  Filled 2014-10-18: qty 2

## 2014-10-18 MED ORDER — 0.9 % SODIUM CHLORIDE (POUR BTL) OPTIME
TOPICAL | Status: DC | PRN
  Administered 2014-10-18: 1000 mL

## 2014-10-18 MED ORDER — PROMETHAZINE HCL 25 MG/ML IJ SOLN: 6.2500 mg | INTRAMUSCULAR | Status: DC | PRN

## 2014-10-18 MED ORDER — EPHEDRINE SULFATE 50 MG/ML IJ SOLN
INTRAMUSCULAR | Status: AC
  Filled 2014-10-18: qty 1

## 2014-10-18 MED ORDER — PROPOFOL 10 MG/ML IV BOLUS
INTRAVENOUS | Status: DC | PRN
  Administered 2014-10-18: 150 mg via INTRAVENOUS
  Administered 2014-10-18: 20 mg via INTRAVENOUS

## 2014-10-18 MED ORDER — LIDOCAINE HCL 4 % MT SOLN
OROMUCOSAL | Status: DC | PRN
  Administered 2014-10-18: 4 mL via TOPICAL

## 2014-10-18 MED ORDER — SUCCINYLCHOLINE CHLORIDE 20 MG/ML IJ SOLN
INTRAMUSCULAR | Status: AC
  Filled 2014-10-18: qty 1

## 2014-10-18 SURGICAL SUPPLY — 21 items
BRUSH CYTOL CELLEBRITY 1.5X140 (MISCELLANEOUS) IMPLANT
CANISTER SUCTION 2500CC (MISCELLANEOUS) ×2 IMPLANT
CONT SPEC 4OZ CLIKSEAL STRL BL (MISCELLANEOUS) ×2 IMPLANT
COVER TABLE BACK 60X90 (DRAPES) ×2 IMPLANT
FORCEPS BIOP RJ4 1.8 (CUTTING FORCEPS) IMPLANT
GAUZE SPONGE 4X4 12PLY STRL (GAUZE/BANDAGES/DRESSINGS) ×2 IMPLANT
GLOVE BIO SURGEON STRL SZ 6.5 (GLOVE) ×2 IMPLANT
KIT CLEAN ENDO COMPLIANCE (KITS) ×4 IMPLANT
KIT ROOM TURNOVER OR (KITS) ×2 IMPLANT
MARKER SKIN DUAL TIP RULER LAB (MISCELLANEOUS) ×2 IMPLANT
NEEDLE BIOPSY TRANSBRONCH 21G (NEEDLE) IMPLANT
NEEDLE BLUNT 18X1 FOR OR ONLY (NEEDLE) IMPLANT
NEEDLE SYS SONOTIP II EBUSTBNA (NEEDLE) ×2 IMPLANT
NS IRRIG 1000ML POUR BTL (IV SOLUTION) ×2 IMPLANT
OIL SILICONE PENTAX (PARTS (SERVICE/REPAIRS)) ×2 IMPLANT
PAD ARMBOARD 7.5X6 YLW CONV (MISCELLANEOUS) ×4 IMPLANT
SYR 20CC LL (SYRINGE) ×2 IMPLANT
SYR 20ML ECCENTRIC (SYRINGE) ×2 IMPLANT
TOWEL OR 17X24 6PK STRL BLUE (TOWEL DISPOSABLE) ×2 IMPLANT
TRAP SPECIMEN MUCOUS 40CC (MISCELLANEOUS) ×2 IMPLANT
TUBE CONNECTING 12X1/4 (SUCTIONS) ×4 IMPLANT

## 2014-10-18 NOTE — H&P (Signed)
[address]       [place] [address]             [phone]                    Maud Beller [place] Medical Record #[id number] Date of Birth: [birth year]  Referring: Dr Benay Spice  Primary Care: Tawanna Solo, MD  Chief Complaint:  Lung mass  History of Present Illness:   Leslie Petersen 80 y.o. female is seen in the office  today for new right lung lesion and hypermetabolic mediastinal adenopathy. Patient has history of  Left upper lung adenocarcinoma (T1 N0), status post a wedge resection, 08/15/2009 by Dr Arlyce Dice. History of Right adrenocortical carcinoma, pT2, status post a right adrenalectomy, 10/09/2009.Left adrenocortical carcinoma, status post a left adrenalectomy in 1994.Biopsy of the left paraspinous mass 10/01/2013 confirmed metastatic carcinoma consistent with metastatic adrenocortical carcinoma Excision of the left paraspinous mass at [facility] on 12/03/2013 with the pathology confirming metastatic adrenocortical carcinoma with negative surgical margins.   Current Activity/ Functional Status:  Patient is independent with mobility/ambulation, transfers, ADL's, IADL's.   Zubrod Score: At the time of surgery this patient's most appropriate activity status/level should be described as: '[]'$     0    Normal activity, no symptoms '[x]'$     1    Restricted in physical strenuous activity but ambulatory, able to do out light work '[]'$     2    Ambulatory and capable of self care, unable to do work activities, up and about               >50 % of waking hours                              '[]'$     3    Only limited self care, in bed greater than 50% of waking hours '[]'$     4    Completely disabled, no self care, confined to bed or chair '[]'$     5    Moribund   Past Medical History  Diagnosis Date  . Allergy     seasonal  . Cataract   . Glaucoma   . Osteoporosis   . Adenomatous colon polyp   . 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 Urinary frequency   . History of hiatal hernia   . Esophageal polyp   . Arthritis     back  . Lung cancer, upper lobe, left 2011    adenocarcinoma resected   . Constipation   . Dry skin     Past Surgical History  Procedure Laterality Date  . Lung cancer surgery  08/2009  . Adrenal gland surgery  1990/2011  . Colonoscopy  multiple  . Malignant tumor from back  June 2015    at [facility] "related to adrenal gland cancer" per pt  . Esophagogastroduodenoscopy    . Cataract extraction, bilateral Bilateral   . Esophagogastroduodenoscopy N/A 09/16/2014    Procedure: ESOPHAGOGASTRODUODENOSCOPY (EGD);  Surgeon: [name], MD;  Location: [facility];  Service: Endoscopy;  Laterality: N/A;  . Hot hemostasis N/A 09/16/2014    Procedure: HOT HEMOSTASIS (ARGON PLASMA COAGULATION/BICAP);  Surgeon: [name], MD;  Location: [facility];  Service: Endoscopy;  Laterality: N/A;  . Tubal ligation      Family History  Problem Relation Age of  Onset  . Heart attack Mother   . Heart attack Father     History   Social History  . Marital Status: Divorced    Spouse Name: N/A  . Number of Children: N/A  . Years of Education: N/A   Occupational History  . Not on file.   Social History Main Topics  . Smoking status: Former Smoker    Quit date: 02/17/1989  . Smokeless tobacco: Never Used  . Alcohol Use: No  . Drug Use: No  . Sexual Activity: Not on file   Other Topics Concern  . Not on file   Social History Narrative    History  Smoking status  . Former Smoker  . Quit date: 02/17/1989  Smokeless tobacco  . Never Used    History  Alcohol Use No     Allergies  Allergen Reactions  . Mirabegron Other (See Comments)    Patient had adverse effect to medication - doesn't recall reaction (patient does not want to take any overactive bladder medications).  Bernadene Bell [Bethanechol] Other (See Comments)    Pt doesn't remember the reaction    No current  facility-administered medications for this encounter.   Facility-Administered Medications Ordered in Other Encounters  Medication Dose Route Frequency Provider Last Rate Last Dose  . lactated ringers infusion   Intravenous Continuous PRN Merdis Delay, CRNA          Review of Systems:     Cardiac Review of Systems: Y or N  Chest Pain [ n   ]  Resting SOB [ n  ] Exertional SOB  Blue.Reese  ]  Orthopnea Blue.Reese  ]   Pedal Edema [  n ]    Palpitations [ n ] Syncope  [ n ]   Presyncope [ n  ]  General Review of Systems: [Y] = yes [  ]=no Constitional: recent weight change Blue.Reese  ];  Wt loss over the last 3 months [   ] anorexia [  ]; fatigue [  ]; nausea [  ]; night sweats [  ]; fever [  ]; or chills [  ];          Dental: poor dentition[n  ]; Last Dentist visit:   Eye : blurred vision [  ]; diplopia [   ]; vision changes [  ];  Amaurosis fugax[  ]; Resp: cough [  ];  wheezing[  ];  hemoptysis[  ]; shortness of breath[  ]; paroxysmal nocturnal dyspnea[  ]; dyspnea on exertion[  ]; or orthopnea[  ];  GI:  gallstones[  ], vomiting[  ];  dysphagia[y  ]; melena[  ];  hematochezia [  ]; heartburn[  ];   Hx of  Colonoscopy[  ]; GU: kidney stones [  ]; hematuria[  ];   dysuria [ n ];  nocturia[  ];  history of     obstruction [  ]; urinary frequency [ n ]             Skin: rash, swelling[  ];, hair loss[  ];  peripheral edema[  ];  or itching[  ]; Musculosketetal: myalgias[  ];  joint swelling[  ];  joint erythema[  ];  joint pain[  ];  back pain[  ];  Heme/Lymph: bruising[  ];  bleeding[  ];  anemia[  ];  Neuro: TIA[n  ];  headaches[  ];  stroke[n  ];  vertigo[  ];  seizures[ n ];   paresthesias[  ];  difficulty walking[ y ];  Psych:depression[  ]; anxiety[  ];  Endocrine: diabetes[  ];  thyroid dysfunction[  ];  Immunizations: Flu up to date [?  ]; Pneumococcal up to date [?  ];  Other:  Physical Exam: BP 166/61 mmHg  Pulse 85  Temp(Src) 98.1 F (36.7 C) (Oral)  Resp 18  Ht '5\' 3"'$  (1.6 m)  Wt 120 lb  (54.432 kg)  BMI 21.26 kg/m2  SpO2 98%  PHYSICAL EXAMINATION: General appearance: alert, cooperative and appears older than stated age Head: Normocephalic, without obvious abnormality, atraumatic Neck: no adenopathy, no carotid bruit, no JVD, supple, symmetrical, trachea midline and thyroid not enlarged, symmetric, no tenderness/mass/nodules Lymph nodes: Cervical, supraclavicular, and axillary nodes normal. Resp: clear to auscultation bilaterally Back: symmetric, no curvature. ROM normal. No CVA tenderness. Low left back healed incision from previos resection Cardio: regular rate and rhythm, S1, S2 normal, no murmur, click, rub or gallop GI: soft, non-tender; bowel sounds normal; no masses,  no organomegaly Extremities: extremities normal, atraumatic, no cyanosis or edema and Homans sign is negative, no sign of DVT Neurologic: Grossly normal No cervical or supraclavicular adenopathy  Diagnostic Studies & Laboratory data:     Recent Radiology Findings:   Ct Chest W Contrast/Ct Abdomen Pelvis W Contrast  09/28/2014   CLINICAL DATA:  Left upper lobe lung adenocarcinoma status post resection. Bilateral adrenalectomy for adrenal cortical carcinoma 1994 and in 2011. Subsequent encounter.  EXAM: CT CHEST, ABDOMEN, AND PELVIS WITH CONTRAST  TECHNIQUE: Multidetector CT imaging of the chest, abdomen and pelvis was performed following the standard protocol during bolus administration of intravenous contrast.  CONTRAST:  15m OMNIPAQUE IOHEXOL 300 MG/ML  SOLN  COMPARISON:  Chest CT 06/12/2014, PET-CT 10/13/2013 an abdominal pelvic CT 09/04/2013.  FINDINGS: CT CHEST FINDINGS  Mediastinum/Nodes: There are stable small AP window, right paratracheal and right hilar lymph nodes, not pathologically enlarged. No axillary adenopathy. The thyroid gland and trachea demonstrate no significant findings. There is a moderate size hiatal hernia. The heart size is normal. There is no pericardial effusion.There is diffuse  atherosclerosis of the aorta, great vessels and coronary arteries.  Lungs/Pleura: There is no pleural effusion.Stable postsurgical changes in the left hemithorax status post left upper lobe wedge resection. Progressive enlargement of sub solid right upper lobe nodule, measuring approximately 10 mm on image 22. This remains worrisome for adenocarcinoma. Small ground-glass nodule more superiorly in the right upper lobe on image number 16 is stable. There are no suspicious pulmonary nodules on the left. Moderate emphysematous changes are again noted.  Musculoskeletal/Chest wall: No chest wall mass or suspicious osseous findings. Chronic lower thoracic compression deformities and partially calcified disc protrusions are unchanged.  CT ABDOMEN AND PELVIS FINDINGS  Hepatobiliary: The liver is normal in density without focal abnormality. No evidence of gallstones, gallbladder wall thickening or biliary dilatation.  Pancreas: Unremarkable. No pancreatic ductal dilatation or surrounding inflammatory changes.  Spleen: Normal in size without focal abnormality.  Adrenals/Urinary Tract: There are stable postsurgical changes from previous bilateral adrenalectomy. Soft tissue nodule in the left adrenalectomy bed is stable, measuring 1.6 x 1.0 cm on image 55. This may be related to volume averaging with the pancreatic tail.The kidneys appear stable with bilateral cortical and renal sinus cysts. No hydronephrosis or urinary tract calculus identified. The bladder appears unremarkable.  Stomach/Bowel: No evidence of bowel wall thickening, distention or surrounding inflammatory change.A moderate size hiatal hernia as noted above. There are diverticular changes throughout the colon. The appendix appears normal.  Vascular/Lymphatic: There are no enlarged abdominal or pelvic lymph nodes. Stable aortoiliac atherosclerosis.  Reproductive: The uterus and ovaries appear normal. Pelvic floor laxity suspected.  Other: No evidence of abdominal  wall mass or hernia.  Musculoskeletal: No acute or significant osseous findings. Scattered endplate degenerative changes in the lumbar spine are stable. There is mild residual asymmetric soft tissue density inferiorly in the left erector spinae muscle, but no residual discrete mass or abnormal enhancement.  IMPRESSION: 1. Progressive enlargement of sub solid right upper lobe pulmonary nodule worrisome for adenocarcinoma. 2. Otherwise stable appearance of the chest status post wedge resection in the left upper lobe for lung cancer. No evidence of thoracic metastatic disease. 3. No discrete residual soft tissue mass identified within the left erector spinae muscle. 4. Otherwise stable abdominal pelvic CT status post bilateral adrenalectomy.   Electronically Signed   By: Richardean Sale M.D.   On: 09/28/2014 10:37     Nm Pet Image Restag (ps) Skull Base To Thigh  10/11/2014   CLINICAL DATA:  Subsequent treatment strategy for lung cancer. Enlarging right lung nodule. Bilateral adrenalectomy leads.  EXAM: NUCLEAR MEDICINE PET SKULL BASE TO THIGH  TECHNIQUE: 6.4 mCi F-18 FDG was injected intravenously. Full-ring PET imaging was performed from the skull base to thigh after the radiotracer. CT data was obtained and used for attenuation correction and anatomic localization.  FASTING BLOOD GLUCOSE:  Value: 91 mg/dl  COMPARISON:  10/13/2013, CT 09/28/2014  FINDINGS: NECK  No hypermetabolic lymph nodes in the neck.  CHEST  11 mm nodule in the right upper lobe has associated metabolic activity SUV max 0.5.  There is hypermetabolic AP window node measuring 10 mm short axis SUV max equals 6. 5. The metabolic activity of is increased from comparison PET-CT of 10/13/2013 (SUV max 3 0). However the size of lymph node is similar measuring 10 mm compared to 10 mm.  ABDOMEN/PELVIS  Bilateral adrenal activities. No abnormal metabolic activity liver. No hypermetabolic abdominal pelvic nodes.  SKELETON  No focal hypermetabolic  activity to suggest skeletal metastasis.  IMPRESSION: 1. Hypermetabolic right upper lobe pulmonary nodule is concerning for bronchogenic carcinoma. 2. Hypermetabolic AP window lymph node is increased in metabolic activity but similar in size to 10/13/2013. Finding is concerning for lung cancer recurrence.aa   Electronically Signed   By: Suzy Bouchard M.D.   On: 10/11/2014 16:08    I have independently reviewed the above radiology studies  and reviewed the findings with the patient.    Recent Lab Findings: Lab Results  Component Value Date   WBC 7.5 10/17/2014   HGB 14.0 10/17/2014   HCT 41.8 10/17/2014   PLT 233 10/17/2014   GLUCOSE 88 10/17/2014   ALT 18 10/17/2014   AST 20 10/17/2014   NA 141 10/17/2014   K 3.7 10/17/2014   CL 108 10/17/2014   CREATININE 0.47 10/17/2014   BUN 15 10/17/2014   CO2 25 10/17/2014   INR 1.04 10/17/2014      Assessment / Plan:  1/ new right lung lesion and hypermetabolic mediastinal adenopathy. Patient has history of  Left upper lung adenocarcinoma (T1 N0) resected 2/ history of adrenocortical carcinoma, with bilateral adenectomy and excision of left back soft tissue recurrence 11/2013   Plan EBUS and BX    may3 The goals risks and alternatives of the planned surgical procedure bronchoscopy and EBUS with biopsy  have been discussed with the patient in detail. The risks of the procedure including death, infection, stroke, myocardial infarction,  bleeding, blood transfusion have all been discussed specifically.  I have quoted [employer] a 1 % of perioperative mortality and a complication rate as high as 10 %. The patient's questions have been answered.Leslie Petersen is willing  to proceed with the planned procedure.   Grace Isaac MD      [address] [place] [address] Office [phone]   Beeper [phone]  10/18/2014 7:11 AM

## 2014-10-18 NOTE — Discharge Instructions (Signed)
Flexible Bronchoscopy, Care After ° °Refer to this sheet in the next few weeks. These instructions provide you with information on caring for yourself after your procedure. Your health care provider may also give you more specific instructions. Your treatment has been planned according to current medical practices, but problems sometimes occur. Call your health care provider if you have any problems or questions after your procedure.  °WHAT TO EXPECT AFTER THE PROCEDURE °It is normal to have the following symptoms for 24-48 hours after the procedure:  °· Increased cough. °· Low-grade fever. °· Sore throat or hoarse voice. °· Small streaks of blood in your thick spit (sputum) if tissue samples were taken (biopsy). °HOME CARE INSTRUCTIONS  °· Do not eat or drink anything for 2 hours after your procedure. Your nose and throat were numbed by medicine. If you try to eat or drink before the medicine wears off, food or drink could go into your lungs or you could burn yourself. After the numbness is gone and your cough and gag reflexes have returned, you may eat soft food and drink liquids slowly.   °· The day after the procedure, you can go back to your normal diet.   °· You may resume normal activities.   °· Keep all follow-up visits as directed by your health care provider. It is important to keep all your appointments, especially if tissue samples were taken for testing (biopsy). °SEEK IMMEDIATE MEDICAL CARE IF:  °· You have increasing shortness of breath.   °· You become light-headed or faint.   °· You have chest pain.   °· You have any new concerning symptoms. °· You cough up more than a small amount of blood. °· The amount of blood you cough up increases. °MAKE SURE YOU: °· Understand these instructions. °· Will watch your condition. °· Will get help right away if you are not doing well or get worse. °Document Released: 12/21/2004 Document Revised: 10/18/2013 Document Reviewed: 02/05/2013 °[employer]® Patient  Information ©2015 [employer]. This information is not intended to replace advice given to you by your health care provider. Make sure you discuss any questions you have with your health care provider. ° °

## 2014-10-18 NOTE — Brief Op Note (Signed)
      [address]       [place] [address]             [phone]     10/18/2014  9:11 AM  PATIENT:  Lurena Joiner  80 y.o. female  PRE-OPERATIVE DIAGNOSIS:   MEDIASTINAL ADENOPATHY, R22.2  POST-OPERATIVE DIAGNOSIS:  MEDIASTINAL ADENOPATHY, R22.2 atypical cells on smear final pending  PROCEDURE:  Procedure(s): VIDEO BRONCHOSCOPY WITH ENDOBRONCHIAL ULTRASOUND (N/A) and Biopsy  SURGEON:  Surgeon(s) and Role:    * Grace Isaac, MD - Primary    ANESTHESIA:   general  EBL:     BLOOD ADMINISTERED:none  DRAINS: none   LOCAL MEDICATIONS USED:  NONE  SPECIMEN:  Source of Specimen:  4 l nodes  DISPOSITION OF SPECIMEN:  PATHOLOGY  COUNTS:  YES    DICTATION: .Dragon Dictation  PLAN OF CARE: Discharge to home after PACU  PATIENT DISPOSITION:  PACU - hemodynamically stable.   Delay start of Pharmacological VTE agent (>24hrs) due to surgical blood loss or risk of bleeding: yes

## 2014-10-18 NOTE — Anesthesia Preprocedure Evaluation (Addendum)
Anesthesia Evaluation  Patient identified by MRN, date of birth, ID band Patient awake    Reviewed: Allergy & Precautions, NPO status   Airway Mallampati: II  TM Distance: >3 FB Neck ROM: Full    Dental   Pulmonary former smoker,  breath sounds clear to auscultation        Cardiovascular Rhythm:Regular Rate:Normal     Neuro/Psych    GI/Hepatic Neg liver ROS, hiatal hernia,   Endo/Other    Renal/GU negative Renal ROS     Musculoskeletal   Abdominal   Peds  Hematology   Anesthesia Other Findings   Reproductive/Obstetrics                            Anesthesia Physical Anesthesia Plan  ASA: II  Anesthesia Plan: General   Post-op Pain Management:    Induction: Intravenous  Airway Management Planned: Oral ETT  Additional Equipment:   Intra-op Plan:   Post-operative Plan: Extubation in OR  Informed Consent: I have reviewed the patients History and Physical, chart, labs and discussed the procedure including the risks, benefits and alternatives for the proposed anesthesia with the patient or authorized representative who has indicated his/her understanding and acceptance.   Dental advisory given  Plan Discussed with: CRNA and Anesthesiologist  Anesthesia Plan Comments:         Anesthesia Quick Evaluation

## 2014-10-18 NOTE — Transfer of Care (Signed)
Immediate Anesthesia Transfer of Care Note  Patient: Leslie Petersen  Procedure(s) Performed: Procedure(s): VIDEO BRONCHOSCOPY WITH ENDOBRONCHIAL ULTRASOUND (N/A)  Patient Location: PACU  Anesthesia Type:General  Level of Consciousness: awake, alert  and oriented  Airway & Oxygen Therapy: Patient Spontanous Breathing and Patient connected to nasal cannula oxygen  Post-op Assessment: Report given to RN and Post -op Vital signs reviewed and stable  Post vital signs: Reviewed and stable  Last Vitals:  Filed Vitals:   10/18/14 0549  BP: 166/61  Pulse: 85  Temp: 36.7 C  Resp: 18    Complications: No apparent anesthesia complications

## 2014-10-18 NOTE — Anesthesia Postprocedure Evaluation (Signed)
  Anesthesia Post-op Note  Patient: Leslie Petersen  Procedure(s) Performed: Procedure(s): VIDEO BRONCHOSCOPY WITH ENDOBRONCHIAL ULTRASOUND (N/A)  Patient Location: PACU  Anesthesia Type:General  Level of Consciousness: awake  Airway and Oxygen Therapy: Patient Spontanous Breathing  Post-op Pain: mild  Post-op Assessment: Post-op Vital signs reviewed  Post-op Vital Signs: Reviewed  Last Vitals:  Filed Vitals:   10/18/14 0958  BP: 134/66  Pulse: 78  Temp:   Resp: 16    Complications: No apparent anesthesia complications

## 2014-10-18 NOTE — Anesthesia Procedure Notes (Signed)
Procedure Name: Intubation Date/Time: 10/18/2014 7:57 AM Performed by: Merdis Delay Pre-anesthesia Checklist: Patient identified, Timeout performed, Emergency Drugs available, Patient being monitored and Suction available Patient Re-evaluated:Patient Re-evaluated prior to inductionOxygen Delivery Method: Circle system utilized Preoxygenation: Pre-oxygenation with 100% oxygen Intubation Type: IV induction Ventilation: Mask ventilation without difficulty Laryngoscope Size: Mac and 3 Grade View: Grade II Tube type: Oral Tube size: 8.5 mm Number of attempts: 1 Airway Equipment and Method: Stylet and LTA kit utilized Placement Confirmation: ETT inserted through vocal cords under direct vision,  breath sounds checked- equal and bilateral,  positive ETCO2 and CO2 detector Secured at: 22 cm Tube secured with: Tape Dental Injury: Teeth and Oropharynx as per pre-operative assessment

## 2014-10-19 ENCOUNTER — Encounter (HOSPITAL_COMMUNITY): Payer: Self-pay | Admitting: Cardiothoracic Surgery

## 2014-10-19 ENCOUNTER — Telehealth: Payer: Self-pay | Admitting: Oncology

## 2014-10-19 ENCOUNTER — Other Ambulatory Visit: Payer: Self-pay | Admitting: *Deleted

## 2014-10-19 ENCOUNTER — Encounter: Payer: Self-pay | Admitting: *Deleted

## 2014-10-19 DIAGNOSIS — C7400 Malignant neoplasm of cortex of unspecified adrenal gland: Secondary | ICD-10-CM

## 2014-10-19 DIAGNOSIS — C7490 Malignant neoplasm of unspecified part of unspecified adrenal gland: Principal | ICD-10-CM

## 2014-10-19 NOTE — [facility] Oncology Navigator Note (Unsigned)
Per Dr. Gearldine Shown request I called patient to update on pathology and set up with an appt.  She and daughter are aware of biopsy results and appt.

## 2014-10-19 NOTE — Telephone Encounter (Signed)
Called and left a message for dana h as this patient does not have an appointment with dr Learta Codding on 5/10 and his schedule is full   anne

## 2014-10-19 NOTE — Telephone Encounter (Signed)
Per dana pt needs to see dr Benay Spice 5/10at 4:00 and now No labs and per dana pt is aware   Leslie Petersen

## 2014-10-20 NOTE — Op Note (Signed)
NAME:  Leslie Petersen, GUYNES NO.:  [id number]  MEDICAL RECORD NO.:  [id number]  LOCATION:  [facility]                         FACILITY:  [facility]  PHYSICIAN:  Lanelle Bal, MD    DATE OF BIRTH:  1934-10-11  DATE OF PROCEDURE:  10/18/2014 DATE OF DISCHARGE:  10/18/2014                              OPERATIVE REPORT   PREOPERATIVE DIAGNOSIS:  Hypermetabolic mediastinal lymph node, aortopulmonary window; previous history of known metastatic adrenocortical carcinoma; and previous wedge resection of left lung for adenocarcinoma.  POSTOPERATIVE DIAGNOSIS:same  PROCEDURE PERFORMED:  Bronchoscopy, EBUS with transbronchial biopsy of 4L lymph node.  SURGEON:  Lanelle Bal, MD  BRIEF HISTORY:  The patient is a 80 year old female who over the past 16 years, had bilateral adrenalectomies for adrenal cortical carcinoma.  In addition, a soft tissue mass was excised from her left paraspinous area approximately 10 months previously and was confirmed adrenocortical carcinoma.  In addition, 2 years previously, Dr. Arlyce Dice had done a wedge resection of the left lung for an adenocarcinoma.  Followup scan showed new small peripheral nodule that was noted in the right lung.  A PET scan was performed that shows a ground-glass opacity in the right lung, but in addition showed a markedly hypermetabolic left mediastinal node along the left paratracheal area at the takeoff of the left bronchus. The patient is frail enough that repeat lung resection would be difficult.  It was recommended to the patient to proceed with EBUS to obtain a tissue diagnosis of the hypermetabolic mediastinal node to evaluate for further treatment.  DESCRIPTION OF PROCEDURE:  The patient agreed and signed informed consent.  She underwent general endotracheal anesthesia.  An 8.5-French endotracheal tube was placed.  Appropriate time-out was performed. First, we proceeded with a fiberoptic bronchoscopy to the  subsegmental level, both left and right tracheobronchial trees without evidence of endobronchial lesions.  The scope was removed and EBUS scope placed. The left paratracheal lymph node was easily identified and through the working channel, a needle biopsy was performed.  First 2 passes of this were submitted and smeared.  Additional total of 10-12 passes were performed and placed in a satellite.  The initial smear suggested atypical cells, but no definitive diagnosis could be made.  At this point, we had done a sufficient number of passes in this area and submitted the remaining tissue in satellite for permanent section.  The patient was awakened in the operating room and extubated, transferred to the recovery room for further postoperative care.  She tolerated the procedure without obvious complications.     Lanelle Bal, MD     EG/MEDQ  D:  10/19/2014  T:  10/20/2014  Job:  [id number]

## 2014-10-21 ENCOUNTER — Telehealth: Payer: Self-pay | Admitting: *Deleted

## 2014-10-21 ENCOUNTER — Encounter: Payer: Self-pay | Admitting: *Deleted

## 2014-10-21 NOTE — [facility] Oncology Navigator Note (Unsigned)
Patient called me back. Per Dr. Benay Spice,  I updated her on her recent cytology being negative.  She was thrilled to hear.  I reminded her of her appt Tuesday so Dr. Benay Spice can go over more.  I also stated that Dr. Benay Spice request I contact Dr. Servando Snare to see if she needs another biopsy.  She verbalized understanding.

## 2014-10-21 NOTE — Telephone Encounter (Signed)
Spoke to Dr. Benay Spice regarding negative cytology.  I asked if I would contact [facility] to ask about re-biopsy and to contact the patient.  I contacted Dr. Servando Snare via EMR and left vm message for patient to call me with my name and phone number.

## 2014-10-21 NOTE — Telephone Encounter (Signed)
Patient called back and had questions about re-biopsy.  I explained that Dr. Benay Spice wants to give her the best care and follow up with Dr. Servando Snare about re-biopsy.  Also, between calls the daughter called and was upset about information given about biopsy earlier.  I explained I was requested to update her mom and pathology was from before.  She stated she was upset about this information.  I clarified updated cytology.  I then explained to the patient her daughter was upset.  Patient stated daughter was fine.  I told patient I update per request of Dr. Benay Spice.  Patient states she is not upset and happy about the news.  She will see Dr. Benay Spice on Tuesday for follow up information.

## 2014-10-25 ENCOUNTER — Ambulatory Visit (HOSPITAL_BASED_OUTPATIENT_CLINIC_OR_DEPARTMENT_OTHER): Payer: Medicare Other | Admitting: Oncology

## 2014-10-25 ENCOUNTER — Telehealth: Payer: Self-pay | Admitting: Oncology

## 2014-10-25 VITALS — BP 144/83 | HR 85 | Temp 98.6°F | Resp 17 | Ht 63.0 in | Wt 120.6 lb

## 2014-10-25 DIAGNOSIS — R918 Other nonspecific abnormal finding of lung field: Secondary | ICD-10-CM | POA: Diagnosis not present

## 2014-10-25 DIAGNOSIS — Z85118 Personal history of other malignant neoplasm of bronchus and lung: Secondary | ICD-10-CM | POA: Diagnosis not present

## 2014-10-25 DIAGNOSIS — D132 Benign neoplasm of duodenum: Secondary | ICD-10-CM | POA: Diagnosis not present

## 2014-10-25 DIAGNOSIS — C7490 Malignant neoplasm of unspecified part of unspecified adrenal gland: Principal | ICD-10-CM

## 2014-10-25 DIAGNOSIS — Z85858 Personal history of malignant neoplasm of other endocrine glands: Secondary | ICD-10-CM | POA: Diagnosis not present

## 2014-10-25 DIAGNOSIS — C7400 Malignant neoplasm of cortex of unspecified adrenal gland: Secondary | ICD-10-CM

## 2014-10-25 NOTE — Progress Notes (Signed)
[name]   Diagnosis: Adrenocortical carcinoma  INTERVAL HISTORY:   Leslie Petersen underwent a PET scan on 10/11/2014. This confirmed a hypermetabolic right upper lobe nodule measuring 11 mm and a 10 mm hypermetabolic AP window node. She was referred to Dr. Servando Snare and was taken to a bronchoscopy/EBUS biopsy of a 4 L lymph node on 10/20/2014. No endobronchial lesions were noted. The cytology from specimen 2 revealed atypical cells. Cells were not present for further characterization on a cell block. The cytology from specimen 1 revealed no malignant cells.  Her only complaint is chronic back pain.  Objective:  Vital signs in last 24 hours:  Blood pressure 144/83, pulse 85, temperature 98.6 F (37 C), temperature source Oral, resp. rate 17, height '5\' 3"'$  (1.6 m), weight 120 lb 9.6 oz (54.704 kg), SpO2 98 %.    HEENT: Neck without mass Lymphatics: No cervical, supra-clavicular, or axillary nodes Resp: Lungs clear bilaterally Cardio: Regular rate and rhythm GI: No hepatosplenomegaly, no mass Vascular: No leg edema   Lab Results:  Lab Results  Component Value Date   WBC 7.5 10/17/2014   HGB 14.0 10/17/2014   HCT 41.8 10/17/2014   MCV 97.2 10/17/2014   PLT 233 10/17/2014   NEUTROABS 5.5 06/12/2014     Medications: I have reviewed the patient's current medications.  Assessment/Plan: 1. 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 2. Left upper lung adenocarcinoma (T1 N0), status post a wedge resection, 08/15/2009. 3. Left adrenocortical carcinoma, status post a left adrenalectomy in 1994. 4. CT of the chest and abdomen, 09/04/2012. No evidence of tumor recurrence or metastatic disease in the chest or abdomen. 5. Soft tissue nodule noted on the CT 09/04/2012, posterior to the left sacrum-present on a PET/CT 07/06/2009 without hypermetabolic activity. Larger on the pelvic CT 09/04/2013, the lesion had  associated enhancement on an MRI 09/16/2013  Biopsy of the left paraspinous mass 10/01/2013 confirmed metastatic carcinoma consistent with metastatic adrenocortical carcinoma  Excision of the left paraspinous mass at [facility] on 12/03/2013 with the pathology confirming metastatic adrenocortical carcinoma with negative surgical margins  Restaging CTs 09/28/2014 with an enlarging right upper lobe nodule and no other evidence of metastatic disease  PET scan 10/11/2014 confirmed a hypermetabolic right upper lobe nodule and a hypermetabolic AP window lymph node  EBUS biopsy of a 4L node on 10/18/2014 revealed rare atypical cells-nondiagnostic 6. Enterococcus urinary tract infection 09/04/2013 7. Esophagitis,? Secondary to Fosamax, improved with Protonix 8. CT of the chest 06/12/2014 with an indeterminate right lung nodule  Progressive enlargement of a sub-solid right upper lobe nodule noted on the CT 09/28/2014 9. Duodenal polyp, status post a biopsy confirming a tubular adenoma on an endoscopy 06/22/2014  Removal of a duodenal polyp 09/16/2014-pathology revealed a duodenal adenoma, no high-grade dysplasia or malignancy identified     Disposition:  Leslie Petersen appears well. I reviewed the biopsy results with her. She understands the biopsy is nondiagnostic. Despite the lack of definitive tumor cells seen on cytology there is a significant likelihood the lung nodule and mediastinal lymph node represent a malignancy. The differential diagnosis includes metastatic adrenocortical carcinoma, non-small cell lung cancer, or another malignancy. The PET findings could also represent a nonmalignant process.  We decided to obtain a three-month follow-up chest CT and decide on the indication for a repeat biopsy. She will return for an office visit after the chest CT in 3 months.  Betsy Coder, MD  10/25/2014  4:17  PM

## 2014-10-25 NOTE — Telephone Encounter (Signed)
Pt confirmed labs/ov per 05/10 POF, gave pt AVS and Calendar.... KJ, pt declined genetics counseling .Marland KitchenMarland KitchenMarland Kitchen

## 2014-11-15 ENCOUNTER — Telehealth: Payer: Self-pay | Admitting: Oncology

## 2014-11-15 NOTE — Telephone Encounter (Signed)
Lft msg for pt confirming MD visit moved to another day due to provider schedule, mailed out schedule.... KJ

## 2014-11-21 ENCOUNTER — Telehealth: Payer: Self-pay | Admitting: Oncology

## 2014-11-21 NOTE — Telephone Encounter (Signed)
Pt called to confirm labs/ov visit per letter in the mail and gave pt Central Scheduling to see about scheduling her CT right after labs per pt.... KJ

## 2015-01-03 ENCOUNTER — Ambulatory Visit: Payer: Medicare Other | Admitting: Oncology

## 2015-01-25 ENCOUNTER — Ambulatory Visit (HOSPITAL_COMMUNITY)
Admission: RE | Admit: 2015-01-25 | Discharge: 2015-01-25 | Disposition: A | Discharge status: Home / Self Care | Payer: Medicare Other | Source: Ambulatory Visit | Attending: Oncology | Admitting: Oncology

## 2015-01-25 ENCOUNTER — Encounter (HOSPITAL_COMMUNITY): Payer: Self-pay

## 2015-01-25 ENCOUNTER — Other Ambulatory Visit (HOSPITAL_BASED_OUTPATIENT_CLINIC_OR_DEPARTMENT_OTHER): Payer: Medicare Other

## 2015-01-25 DIAGNOSIS — Z85118 Personal history of other malignant neoplasm of bronchus and lung: Secondary | ICD-10-CM | POA: Insufficient documentation

## 2015-01-25 DIAGNOSIS — R918 Other nonspecific abnormal finding of lung field: Secondary | ICD-10-CM

## 2015-01-25 DIAGNOSIS — I2510 Atherosclerotic heart disease of native coronary artery without angina pectoris: Secondary | ICD-10-CM | POA: Insufficient documentation

## 2015-01-25 DIAGNOSIS — C7400 Malignant neoplasm of cortex of unspecified adrenal gland: Secondary | ICD-10-CM

## 2015-01-25 DIAGNOSIS — K449 Diaphragmatic hernia without obstruction or gangrene: Secondary | ICD-10-CM | POA: Diagnosis not present

## 2015-01-25 DIAGNOSIS — E896 Postprocedural adrenocortical (-medullary) hypofunction: Secondary | ICD-10-CM | POA: Diagnosis not present

## 2015-01-25 DIAGNOSIS — C7490 Malignant neoplasm of unspecified part of unspecified adrenal gland: Secondary | ICD-10-CM

## 2015-01-25 DIAGNOSIS — N289 Disorder of kidney and ureter, unspecified: Secondary | ICD-10-CM | POA: Diagnosis not present

## 2015-01-25 DIAGNOSIS — J432 Centrilobular emphysema: Secondary | ICD-10-CM | POA: Diagnosis not present

## 2015-01-25 DIAGNOSIS — R911 Solitary pulmonary nodule: Secondary | ICD-10-CM | POA: Diagnosis present

## 2015-01-25 LAB — COMPREHENSIVE METABOLIC PANEL (CC13)
ALT: 19 U/L (ref 0–55)
AST: 17 U/L (ref 5–34)
Albumin: 3.3 g/dL — ABNORMAL LOW (ref 3.5–5.0)
Alkaline Phosphatase: 40 U/L (ref 40–150)
Anion Gap: 5 mEq/L (ref 3–11)
BILIRUBIN TOTAL: 0.99 mg/dL (ref 0.20–1.20)
BUN: 13.5 mg/dL (ref 7.0–26.0)
CO2: 29 meq/L (ref 22–29)
CREATININE: 0.7 mg/dL (ref 0.6–1.1)
Calcium: 8.8 mg/dL (ref 8.4–10.4)
Chloride: 108 mEq/L (ref 98–109)
EGFR: 84 mL/min/{1.73_m2} — ABNORMAL LOW (ref >90)
Glucose: 76 mg/dl (ref 70–140)
Potassium: 3.4 mEq/L — ABNORMAL LOW (ref 3.5–5.1)
Sodium: 142 mEq/L (ref 136–145)
Total Protein: 6.1 g/dL — ABNORMAL LOW (ref 6.4–8.3)

## 2015-01-25 LAB — CBC WITH DIFFERENTIAL/PLATELET
BASO%: 0.6 % (ref 0.0–2.0)
BASOS ABS: 0 10*3/uL (ref 0.0–0.1)
EOS ABS: 0.1 10*3/uL (ref 0.0–0.5)
EOS%: 1.9 % (ref 0.0–7.0)
HEMATOCRIT: 38.8 % (ref 34.8–46.6)
HEMOGLOBIN: 13.2 g/dL (ref 11.6–15.9)
LYMPH#: 1.5 10*3/uL (ref 0.9–3.3)
LYMPH%: 31.5 % (ref 14.0–49.7)
MCH: 33.2 pg (ref 25.1–34.0)
MCHC: 34 g/dL (ref 31.5–36.0)
MCV: 97.5 fL (ref 79.5–101.0)
MONO#: 0.4 10*3/uL (ref 0.1–0.9)
MONO%: 9.5 % (ref 0.0–14.0)
NEUT%: 56.5 % (ref 38.4–76.8)
NEUTROS ABS: 2.6 10*3/uL (ref 1.5–6.5)
Platelets: 218 10*3/uL (ref 145–400)
RBC: 3.98 10*6/uL (ref 3.70–5.45)
RDW: 13.3 % (ref 11.2–14.5)
WBC: 4.6 10*3/uL (ref 3.9–10.3)

## 2015-01-25 MED ORDER — IOHEXOL 300 MG/ML  SOLN
75.0000 mL | Freq: Once | INTRAMUSCULAR | Status: AC | PRN
  Administered 2015-01-25: 75 mL via INTRAVENOUS

## 2015-01-26 ENCOUNTER — Telehealth: Payer: Self-pay | Admitting: Oncology

## 2015-01-26 ENCOUNTER — Ambulatory Visit (HOSPITAL_BASED_OUTPATIENT_CLINIC_OR_DEPARTMENT_OTHER): Payer: Medicare Other | Admitting: Oncology

## 2015-01-26 ENCOUNTER — Ambulatory Visit: Payer: Medicare Other | Admitting: Oncology

## 2015-01-26 VITALS — BP 155/74 | HR 86 | Temp 99.2°F | Resp 17 | Ht 63.0 in | Wt 124.8 lb

## 2015-01-26 DIAGNOSIS — Z85118 Personal history of other malignant neoplasm of bronchus and lung: Secondary | ICD-10-CM | POA: Diagnosis not present

## 2015-01-26 DIAGNOSIS — C3490 Malignant neoplasm of unspecified part of unspecified bronchus or lung: Secondary | ICD-10-CM

## 2015-01-26 DIAGNOSIS — Z85858 Personal history of malignant neoplasm of other endocrine glands: Secondary | ICD-10-CM

## 2015-01-26 DIAGNOSIS — R918 Other nonspecific abnormal finding of lung field: Secondary | ICD-10-CM | POA: Diagnosis not present

## 2015-01-26 DIAGNOSIS — N951 Menopausal and female climacteric states: Secondary | ICD-10-CM | POA: Diagnosis not present

## 2015-01-26 NOTE — Progress Notes (Signed)
[place] OFFICE PROGRESS NOTE   Diagnosis: Adrenocortical carcinoma  INTERVAL HISTORY:   She returns as scheduled. Leslie Petersen reports hot flashes since undergoing an adrenalectomy in 2011. The hot flashes chiefly current night and are stable. The cortisol replacement was recently increased by Dr. Buddy Duty. This has not helped the hot flashes. She otherwise feels well. Good appetite. No pain.  Objective:  Vital signs in last 24 hours:  Blood pressure 155/74, pulse 86, temperature 99.2 F (37.3 C), temperature source Oral, resp. rate 17, height '5\' 3"'$  (1.6 m), weight 124 lb 12.8 oz (56.609 kg), SpO2 97 %.    HEENT: Neck without mass Lymphatics: No cervical, supraclavicular, axillary, or inguinal nodes Resp: Bronchial sounds throughout the left chest, no respiratory distress Cardio: Regular rate and rhythm GI: No hepatomegaly, nontender, no mass Vascular: No leg edema Skin: Left lower back scar without evidence of current tumor    Lab Results:  Lab Results  Component Value Date   WBC 4.6 01/25/2015   HGB 13.2 01/25/2015   HCT 38.8 01/25/2015   MCV 97.5 01/25/2015   PLT 218 01/25/2015   NEUTROABS 2.6 01/25/2015   Potassium 3.4, creatinine 0.7  Imaging:  Ct Chest W Contrast  01/25/2015   CLINICAL DATA:  Right upper lobe nodule, left lung cancer.  EXAM: CT CHEST WITH CONTRAST  TECHNIQUE: Multidetector CT imaging of the chest was performed during intravenous contrast administration.  CONTRAST:  9m OMNIPAQUE IOHEXOL 300 MG/ML  SOLN  COMPARISON:  PET 10/11/2014 and CT chest 09/28/2014.  FINDINGS: Mediastinum/Nodes: Mediastinal lymph nodes may be minimally more prominent, measuring up to 10 mm in short axis in the low left paratracheal station (Previously 8 mm). No hilar or axillary adenopathy. Coronary artery calcification. Heart size normal. No pericardial effusion. Moderate hiatal hernia.  Lungs/Pleura: Centrilobular emphysema. A spiculated ground-glass nodule  with internal lucencies in the right upper lobe measures 2.2 x 2.6 cm (series 5, image 24), previously 1.6 x 2.0 cm when remeasured. Smaller ground-glass nodular density in the right upper lobe (image 18), stable. Postoperative scarring and volume loss in the left hemi thorax, stable. No pleural fluid. Airway is otherwise unremarkable.  Upper abdomen: Visualized portions of the liver and gallbladder are unremarkable. Bilateral adrenalectomies. 5 mm low-attenuation lesion in the upper pole right kidney is too small to characterize but stable. Visualized portions of the kidneys, spleen and pancreas are otherwise unremarkable. Moderate hiatal hernia. No upper abdominal adenopathy.  Musculoskeletal: No worrisome lytic or sclerotic lesions. T12 compression fracture is unchanged.  IMPRESSION: 1. Enlarging spiculated right upper lobe ground-glass nodule, highly worrisome for bronchogenic carcinoma(adenocarcinoma in situ). 2. Mediastinal lymph nodes appear minimally more prominent than on 09/28/2014. 3. Coronary artery calcification.   Electronically Signed   By: MLorin PicketM.D.   On: 01/25/2015 10:06    Medications: I have reviewed the patient's current medications.  Assessment/Plan: 1. 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 2. Left upper lung adenocarcinoma (T1 N0), status post a wedge resection, 08/15/2009. 3. Left adrenocortical carcinoma, status post a left adrenalectomy in 1994. 4. CT of the chest and abdomen, 09/04/2012. No evidence of tumor recurrence or metastatic disease in the chest or abdomen. 5. Soft tissue nodule noted on the CT 09/04/2012, posterior to the left sacrum-present on a PET/CT 07/06/2009 without hypermetabolic activity. Larger on the pelvic CT 09/04/2013, the lesion had associated enhancement on an MRI 09/16/2013  Biopsy of the left paraspinous mass 10/01/2013 confirmed metastatic carcinoma consistent with  metastatic adrenocortical  carcinoma  Excision of the left paraspinous mass at [facility] on 12/03/2013 with the pathology confirming metastatic adrenocortical carcinoma with negative surgical margins  Restaging CTs 09/28/2014 with an enlarging right upper lobe nodule and no other evidence of metastatic disease  PET scan 10/11/2014 confirmed a hypermetabolic right upper lobe nodule and a hypermetabolic AP window lymph node  EBUS biopsy of a 4L node on 10/18/2014 revealed rare atypical cells-nondiagnostic  CT 01/25/2015 confirmed enlargement of the right upper lung nodule and slight enlargement of mediastinal lymph nodes 6. Enterococcus urinary tract infection 09/04/2013 7. Esophagitis,? Secondary to Fosamax, improved with Protonix 8. CT of the chest 06/12/2014 with an indeterminate right lung nodule  Progressive enlargement of a sub-solid right upper lobe nodule noted on the CT 09/28/2014 9. Duodenal polyp, status post a biopsy confirming a tubular adenoma on an endoscopy 06/22/2014  Removal of a duodenal polyp 09/16/2014-pathology revealed a duodenal adenoma, no high-grade dysplasia or malignancy identified     Disposition:  Leslie Petersen appears stable. I reviewed the CT images with Leslie Petersen and her daughter. The dominant right lung nodule has increased in size over the past 3 months. We discussed the differential diagnosis. She agrees to a referral to Dr. Servando Snare to consider a repeat EBUS to biopsy a mediastinal node and if possible the right lung mass.  She will return for an office visit here 02/15/2015. We will consider definitive chemotherapy/radiation if she has non-small cell lung cancer.  Leslie Coder, MD  01/26/2015  11:17 AM

## 2015-01-26 NOTE — Telephone Encounter (Signed)
per pof to sch pt appt-referral to Dr Catalina Pizza spoke w/Jill and she stated in [facility] and will call pt to sch appt-adv pt

## 2015-01-27 ENCOUNTER — Ambulatory Visit: Payer: Medicare Other | Admitting: Oncology

## 2015-02-02 ENCOUNTER — Other Ambulatory Visit: Payer: Self-pay | Admitting: *Deleted

## 2015-02-02 ENCOUNTER — Encounter: Payer: Self-pay | Admitting: Cardiothoracic Surgery

## 2015-02-02 ENCOUNTER — Ambulatory Visit (INDEPENDENT_AMBULATORY_CARE_PROVIDER_SITE_OTHER): Payer: Medicare Other | Admitting: Cardiothoracic Surgery

## 2015-02-02 VITALS — BP 160/80 | HR 88 | Resp 20 | Ht 63.0 in | Wt 124.0 lb

## 2015-02-02 DIAGNOSIS — R599 Enlarged lymph nodes, unspecified: Secondary | ICD-10-CM | POA: Diagnosis not present

## 2015-02-02 DIAGNOSIS — R590 Localized enlarged lymph nodes: Secondary | ICD-10-CM

## 2015-02-02 DIAGNOSIS — R918 Other nonspecific abnormal finding of lung field: Secondary | ICD-10-CM

## 2015-02-02 NOTE — Progress Notes (Signed)
[address]       [place] [address]             [phone]                    Leslie Petersen [place] Medical Record #[id number] Date of Birth: September 04, 1934  Referring: Ladell Pier, MD Primary Care: Tawanna Solo, MD  Chief Complaint:  Lung mass  History of Present Illness:   Leslie Petersen 80 y.o. female is seen in the office   for new right lung lesion and hypermetabolic mediastinal adenopathy. Patient has history of  Left upper lung adenocarcinoma (T1 N0), status post a wedge resection, 08/15/2009 by Dr Arlyce Dice. History of Right adrenocortical carcinoma, pT2, status post a right adrenalectomy, 10/09/2009.Left adrenocortical carcinoma, status post a left adrenalectomy in 1994.Biopsy of the left paraspinous mass 10/01/2013 confirmed metastatic carcinoma consistent with metastatic adrenocortical carcinoma Excision of the left paraspinous mass at [facility] on 12/03/2013 with the pathology confirming metastatic adrenocortical carcinoma with negative surgical margins.  5/3/ 2016 patient under went ebus and bx of mediastinal nodes, no malignancy Found.  Follow up ct done : A spiculated ground-glass nodule with internal lucencies in the right upper lobe measures 2.2 x 2.6 cm (series 5, image 24), previously 1.6 x 2.0 cm when remeasured.   Current Activity/ Functional Status:  Patient is independent with mobility/ambulation, transfers, ADL's, IADL's.   Zubrod Score: At the time of surgery this patient's most appropriate activity status/level should be described as: '[]'$     0    Normal activity, no symptoms '[x]'$     1    Restricted in physical strenuous activity but ambulatory, able to do out light work '[]'$     2    Ambulatory and capable of self care, unable to do work activities, up and about               >50 % of waking hours                              '[]'$     3    Only limited self care, in bed greater than 50% of waking hours '[]'$     4    Completely disabled, no  self care, confined to bed or chair '[]'$     5    Moribund   Past Medical History  Diagnosis Date  . Allergy     seasonal  . Cataract   . Glaucoma   . Osteoporosis   . Adenomatous colon polyp   . 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 Urinary frequency   . History of hiatal hernia   . Esophageal polyp   . Arthritis     back  . Constipation   . Dry skin   . Lung cancer, upper lobe, left 2011    adenocarcinoma resected     Past Surgical History  Procedure Laterality Date  . Lung cancer surgery  08/2009  . Adrenal gland surgery  1990/2011  . Colonoscopy  multiple  . Malignant tumor from back  June 2015    at [facility] "related to adrenal gland cancer" per pt  . Esophagogastroduodenoscopy    . Cataract extraction, bilateral Bilateral   . Esophagogastroduodenoscopy N/A 09/16/2014    Procedure: ESOPHAGOGASTRODUODENOSCOPY (EGD);  Surgeon: [name], MD;  Location: [facility];  Service: Endoscopy;  Laterality: N/A;  . Hot hemostasis N/A 09/16/2014    Procedure: HOT HEMOSTASIS (ARGON PLASMA COAGULATION/BICAP);  Surgeon: [name], MD;  Location: [facility];  Service: Endoscopy;  Laterality: N/A;  . Tubal ligation    . Video bronchoscopy with endobronchial ultrasound N/A 10/18/2014    Procedure: VIDEO BRONCHOSCOPY WITH ENDOBRONCHIAL ULTRASOUND;  Surgeon: Grace Isaac, MD;  Location: [facility] OR;  Service: Thoracic;  Laterality: N/A;    Family History  Problem Relation Age of Onset  . Heart attack Mother   . Heart attack Father     Social History   Social History  . Marital Status: Divorced    Spouse Name: N/A  . Number of Children: N/A  . Years of Education: N/A   Occupational History  . Not on file.   Social History Main Topics  . Smoking status: Former Smoker    Quit date: 02/17/1989  . Smokeless tobacco: Never Used  . Alcohol Use: No  . Drug Use: No  . Sexual Activity: Not on file   Other Topics Concern  . Not on  file   Social History Narrative    History  Smoking status  . Former Smoker  . Quit date: 02/17/1989  Smokeless tobacco  . Never Used    History  Alcohol Use No     Allergies  Allergen Reactions  . Mirabegron Other (See Comments)    Patient had adverse effect to medication - doesn't recall reaction   . Bernadene Bell [Bethanechol] Other (See Comments)    Pt doesn't remember the reaction    Current Outpatient Prescriptions  Medication Sig Dispense Refill  . aspirin EC 81 MG tablet Take 1 tablet (81 mg total) by mouth at bedtime. Stop and restart on September 30, 2014    . Calcium Carb-Cholecalciferol (CALCIUM 600 + D PO) Take 600 mg by mouth daily after lunch.    . docusate sodium (COLACE) 100 MG capsule Take 100 mg by mouth daily as needed for mild constipation.     . Ergocalciferol (VITAMIN D2) 2000 UNITS TABS Take 2,000 Units by mouth daily after lunch.    . fexofenadine (ALLEGRA) 180 MG tablet Take 180 mg by mouth daily.    . fludrocortisone (FLORINEF) 0.1 MG tablet Take 0.1 mg by mouth daily after lunch.     . hydrocortisone (CORTEF) 10 MG tablet Take 15 mg by mouth daily at 6 (six) AM.     . ibuprofen (ADVIL,MOTRIN) 200 MG tablet Take 400 mg by mouth daily as needed (pain).    . Methen-Hyosc-Meth Blue-Na Phos (UROLET MB) 81.6 MG TABS Take 81.6 mg by mouth 2 (two) times daily.  3  . Multiple Vitamins-Minerals (MULTIVITAMIN WITH MINERALS) tablet Take 1 tablet by mouth daily after lunch.     . Omega-3 Fatty Acids (FISH OIL) 1200 MG CAPS Take 1,200 mg by mouth daily after lunch.    . pantoprazole (PROTONIX) 20 MG tablet Take 1 tablet (20 mg total) by mouth daily. (Patient taking differently: Take 20 mg by mouth daily at 6 (six) AM. ) 30 tablet 11  . polycarbophil (FIBERCON) 625 MG tablet Take 1,250 mg by mouth daily as needed for mild constipation.     . Travoprost, BAK Free, (TRAVATAN) 0.004 % SOLN ophthalmic solution Place 1 drop into both eyes at bedtime.    . Vitamins-Lipotropics  (LIPO-FLAVONOID PLUS PO) Take 1 tablet by mouth daily as needed (ringing in ears).      No current facility-administered medications for this visit.  Review of Systems:     Cardiac Review of Systems: Y or N  Chest Pain [ n   ]  Resting SOB [ n  ] Exertional SOB  Blue.Reese  ]  Orthopnea Blue.Reese  ]   Pedal Edema [  n ]    Palpitations [ n ] Syncope  [ n ]   Presyncope [ n  ]  General Review of Systems: [Y] = yes [  ]=no Constitional: recent weight change Blue.Reese  ];  Wt loss over the last 3 months [   ] anorexia [  ]; fatigue [  ]; nausea [  ]; night sweats [  ]; fever [  ]; or chills [  ];          Dental: poor dentition[n  ]; Last Dentist visit:   Eye : blurred vision [  ]; diplopia [   ]; vision changes [  ];  Amaurosis fugax[  ]; Resp: cough [  ];  wheezing[  ];  hemoptysis[  ]; shortness of breath[  ]; paroxysmal nocturnal dyspnea[  ]; dyspnea on exertion[  ]; or orthopnea[  ];  GI:  gallstones[  ], vomiting[  ];  dysphagia[y  ]; melena[  ];  hematochezia [  ]; heartburn[  ];   Hx of  Colonoscopy[  ]; GU: kidney stones [  ]; hematuria[  ];   dysuria [ n ];  nocturia[  ];  history of     obstruction [  ]; urinary frequency [ n ]             Skin: rash, swelling[  ];, hair loss[  ];  peripheral edema[  ];  or itching[  ]; Musculosketetal: myalgias[  ];  joint swelling[  ];  joint erythema[  ];  joint pain[  ];  back pain[  ];  Heme/Lymph: bruising[  ];  bleeding[  ];  anemia[  ];  Neuro: TIA[n  ];  headaches[  ];  stroke[n  ];  vertigo[  ];  seizures[ n ];   paresthesias[  ];  difficulty walking[ y ];  Psych:depression[  ]; anxiety[  ];  Endocrine: diabetes[  ];  thyroid dysfunction[  ];  Immunizations: Flu up to date Blue.Reese  ]; Pneumococcal up to date [ y ];  Other:  Physical Exam: BP 160/80 mmHg  Pulse 88  Resp 20  Ht '5\' 3"'$  (1.6 m)  Wt 124 lb (56.246 kg)  BMI 21.97 kg/m2  SpO2 96%  PHYSICAL EXAMINATION: General appearance: alert, cooperative and appears older than stated age Head:  Normocephalic, without obvious abnormality, atraumatic Neck: no adenopathy, no carotid bruit, no JVD, supple, symmetrical, trachea midline and thyroid not enlarged, symmetric, no tenderness/mass/nodules Lymph nodes: Cervical, supraclavicular, and axillary nodes normal. Resp: clear to auscultation bilaterally Back: symmetric, no curvature. ROM normal. No CVA tenderness. Low left back healed incision from previos resection Cardio: regular rate and rhythm, S1, S2 normal, no murmur, click, rub or gallop GI: soft, non-tender; bowel sounds normal; no masses,  no organomegaly Extremities: extremities normal, atraumatic, no cyanosis or edema and Homans sign is negative, no sign of DVT Neurologic: Grossly normal No cervical or supraclavicular adenopathy  Diagnostic Studies & Laboratory data:     Recent Radiology Findings:  Ct Chest W Contrast  01/25/2015   CLINICAL DATA:  Right upper lobe nodule, left lung cancer.  EXAM: CT CHEST WITH CONTRAST  TECHNIQUE: Multidetector CT imaging of the chest was performed during intravenous contrast administration.  CONTRAST:  58m OMNIPAQUE IOHEXOL 300 MG/ML  SOLN  COMPARISON:  PET 10/11/2014 and CT chest 09/28/2014.  FINDINGS: Mediastinum/Nodes: Mediastinal lymph nodes may be minimally more prominent, measuring up to 10 mm in short axis in the low left paratracheal station (Previously 8 mm). No hilar or axillary adenopathy. Coronary artery calcification. Heart size normal. No pericardial effusion. Moderate hiatal hernia.  Lungs/Pleura: Centrilobular emphysema. A spiculated ground-glass nodule with internal lucencies in the right upper lobe measures 2.2 x 2.6 cm (series 5, image 24), previously 1.6 x 2.0 cm when remeasured. Smaller ground-glass nodular density in the right upper lobe (image 18), stable. Postoperative scarring and volume loss in the left hemi thorax, stable. No pleural fluid. Airway is otherwise unremarkable.  Upper abdomen: Visualized portions of the liver  and gallbladder are unremarkable. Bilateral adrenalectomies. 5 mm low-attenuation lesion in the upper pole right kidney is too small to characterize but stable. Visualized portions of the kidneys, spleen and pancreas are otherwise unremarkable. Moderate hiatal hernia. No upper abdominal adenopathy.  Musculoskeletal: No worrisome lytic or sclerotic lesions. T12 compression fracture is unchanged.  IMPRESSION: 1. Enlarging spiculated right upper lobe ground-glass nodule, highly worrisome for bronchogenic carcinoma(adenocarcinoma in situ). 2. Mediastinal lymph nodes appear minimally more prominent than on 09/28/2014. 3. Coronary artery calcification.   Electronically Signed   By: MLorin PicketM.D.   On: 01/25/2015 10:06    Ct Chest W Contrast/Ct Abdomen Pelvis W Contrast  09/28/2014   CLINICAL DATA:  Left upper lobe lung adenocarcinoma status post resection. Bilateral adrenalectomy for adrenal cortical carcinoma 1994 and in 2011. Subsequent encounter.  EXAM: CT CHEST, ABDOMEN, AND PELVIS WITH CONTRAST  TECHNIQUE: Multidetector CT imaging of the chest, abdomen and pelvis was performed following the standard protocol during bolus administration of intravenous contrast.  CONTRAST:  1048mOMNIPAQUE IOHEXOL 300 MG/ML  SOLN  COMPARISON:  Chest CT 06/12/2014, PET-CT 10/13/2013 an abdominal pelvic CT 09/04/2013.  FINDINGS: CT CHEST FINDINGS  Mediastinum/Nodes: There are stable small AP window, right paratracheal and right hilar lymph nodes, not pathologically enlarged. No axillary adenopathy. The thyroid gland and trachea demonstrate no significant findings. There is a moderate size hiatal hernia. The heart size is normal. There is no pericardial effusion.There is diffuse atherosclerosis of the aorta, great vessels and coronary arteries.  Lungs/Pleura: There is no pleural effusion.Stable postsurgical changes in the left hemithorax status post left upper lobe wedge resection. Progressive enlargement of sub solid right  upper lobe nodule, measuring approximately 10 mm on image 22. This remains worrisome for adenocarcinoma. Small ground-glass nodule more superiorly in the right upper lobe on image number 16 is stable. There are no suspicious pulmonary nodules on the left. Moderate emphysematous changes are again noted.  Musculoskeletal/Chest wall: No chest wall mass or suspicious osseous findings. Chronic lower thoracic compression deformities and partially calcified disc protrusions are unchanged.  CT ABDOMEN AND PELVIS FINDINGS  Hepatobiliary: The liver is normal in density without focal abnormality. No evidence of gallstones, gallbladder wall thickening or biliary dilatation.  Pancreas: Unremarkable. No pancreatic ductal dilatation or surrounding inflammatory changes.  Spleen: Normal in size without focal abnormality.  Adrenals/Urinary Tract: There are stable postsurgical changes from previous bilateral adrenalectomy. Soft tissue nodule in the left adrenalectomy bed is stable, measuring 1.6 x 1.0 cm on image 55. This may be related to volume averaging with the pancreatic tail.The kidneys appear stable with bilateral cortical and renal sinus cysts. No hydronephrosis or urinary tract calculus identified. The bladder appears unremarkable.  Stomach/Bowel: No evidence  of bowel wall thickening, distention or surrounding inflammatory change.A moderate size hiatal hernia as noted above. There are diverticular changes throughout the colon. The appendix appears normal.  Vascular/Lymphatic: There are no enlarged abdominal or pelvic lymph nodes. Stable aortoiliac atherosclerosis.  Reproductive: The uterus and ovaries appear normal. Pelvic floor laxity suspected.  Other: No evidence of abdominal wall mass or hernia.  Musculoskeletal: No acute or significant osseous findings. Scattered endplate degenerative changes in the lumbar spine are stable. There is mild residual asymmetric soft tissue density inferiorly in the left erector spinae  muscle, but no residual discrete mass or abnormal enhancement.  IMPRESSION: 1. Progressive enlargement of sub solid right upper lobe pulmonary nodule worrisome for adenocarcinoma. 2. Otherwise stable appearance of the chest status post wedge resection in the left upper lobe for lung cancer. No evidence of thoracic metastatic disease. 3. No discrete residual soft tissue mass identified within the left erector spinae muscle. 4. Otherwise stable abdominal pelvic CT status post bilateral adrenalectomy.   Electronically Signed   By: Richardean Sale M.D.   On: 09/28/2014 10:37     Nm Pet Image Restag (ps) Skull Base To Thigh  10/11/2014   CLINICAL DATA:  Subsequent treatment strategy for lung cancer. Enlarging right lung nodule. Bilateral adrenalectomy leads.  EXAM: NUCLEAR MEDICINE PET SKULL BASE TO THIGH  TECHNIQUE: 6.4 mCi F-18 FDG was injected intravenously. Full-ring PET imaging was performed from the skull base to thigh after the radiotracer. CT data was obtained and used for attenuation correction and anatomic localization.  FASTING BLOOD GLUCOSE:  Value: 91 mg/dl  COMPARISON:  10/13/2013, CT 09/28/2014  FINDINGS: NECK  No hypermetabolic lymph nodes in the neck.  CHEST  11 mm nodule in the right upper lobe has associated metabolic activity SUV max 0.5.  There is hypermetabolic AP window node measuring 10 mm short axis SUV max equals 6. 5. The metabolic activity of is increased from comparison PET-CT of 10/13/2013 (SUV max 3 0). However the size of lymph node is similar measuring 10 mm compared to 10 mm.  ABDOMEN/PELVIS  Bilateral adrenal activities. No abnormal metabolic activity liver. No hypermetabolic abdominal pelvic nodes.  SKELETON  No focal hypermetabolic activity to suggest skeletal metastasis.  IMPRESSION: 1. Hypermetabolic right upper lobe pulmonary nodule is concerning for bronchogenic carcinoma. 2. Hypermetabolic AP window lymph node is increased in metabolic activity but similar in size to  10/13/2013. Finding is concerning for lung cancer recurrence.aa   Electronically Signed   By: Suzy Bouchard M.D.   On: 10/11/2014 16:08    I have independently reviewed the above radiology studies  and reviewed the findings with the patient.    Recent Lab Findings: Lab Results  Component Value Date   WBC 4.6 01/25/2015   HGB 13.2 01/25/2015   HCT 38.8 01/25/2015   PLT 218 01/25/2015   GLUCOSE 76 01/25/2015   ALT 19 01/25/2015   AST 17 01/25/2015   NA 142 01/25/2015   K 3.4* 01/25/2015   CL 108 10/17/2014   CREATININE 0.7 01/25/2015   BUN 13.5 01/25/2015   CO2 29 01/25/2015   INR 1.04 10/17/2014      Assessment / Plan:  1/Increasing  right lung lesion and hypermetabolic mediastinal adenopathy. Patient has history of  Left upper lung adenocarcinoma (T1 N0) resected 2/ history of adrenocortical carcinoma, with bilateral adenectomy and excision of left back soft tissue recurrence 11/2013   I recommend to the patient proceeding with ebus and navigation bronchoscopy to re-biopsy mediastinal nodes  and also to try and obtain a tissue diagnosis from the increasing right upper lobe lung lesion that is highly suspicious for a slow-growing adenocarcinoma the lung. Risks and options were discussed with the patient and her daughter in detail. We'll plan to proceed on February 06 2015     I  spent 20 minutes counseling the patient face to face and 50% or more the  time was spent in counseling and coordination of care. The total time spent in the appointment was 30 minutes.  Grace Isaac MD      [address] [place] [address] Office [phone]   Beeper [phone]  02/02/2015 9:40 AM

## 2015-02-03 ENCOUNTER — Encounter (HOSPITAL_COMMUNITY)
Admission: RE | Admit: 2015-02-03 | Discharge: 2015-02-03 | Disposition: A | Discharge status: Home / Self Care | Payer: Medicare Other | Source: Ambulatory Visit | Attending: Cardiothoracic Surgery | Admitting: Cardiothoracic Surgery

## 2015-02-03 ENCOUNTER — Encounter (HOSPITAL_COMMUNITY): Payer: Self-pay

## 2015-02-03 VITALS — BP 146/86 | HR 97 | Temp 99.1°F | Resp 20 | Ht 63.0 in | Wt 123.3 lb

## 2015-02-03 DIAGNOSIS — Z8589 Personal history of malignant neoplasm of other organs and systems: Secondary | ICD-10-CM | POA: Diagnosis not present

## 2015-02-03 DIAGNOSIS — R918 Other nonspecific abnormal finding of lung field: Secondary | ICD-10-CM | POA: Diagnosis present

## 2015-02-03 DIAGNOSIS — M1990 Unspecified osteoarthritis, unspecified site: Secondary | ICD-10-CM | POA: Diagnosis not present

## 2015-02-03 DIAGNOSIS — Z85118 Personal history of other malignant neoplasm of bronchus and lung: Secondary | ICD-10-CM | POA: Diagnosis not present

## 2015-02-03 DIAGNOSIS — H9190 Unspecified hearing loss, unspecified ear: Secondary | ICD-10-CM | POA: Diagnosis not present

## 2015-02-03 DIAGNOSIS — R590 Localized enlarged lymph nodes: Secondary | ICD-10-CM | POA: Diagnosis not present

## 2015-02-03 DIAGNOSIS — Z87891 Personal history of nicotine dependence: Secondary | ICD-10-CM | POA: Diagnosis not present

## 2015-02-03 DIAGNOSIS — Z79899 Other long term (current) drug therapy: Secondary | ICD-10-CM | POA: Diagnosis not present

## 2015-02-03 DIAGNOSIS — K219 Gastro-esophageal reflux disease without esophagitis: Secondary | ICD-10-CM | POA: Diagnosis not present

## 2015-02-03 HISTORY — DX: Gastro-esophageal reflux disease without esophagitis: K21.9

## 2015-02-03 HISTORY — DX: Presence of spectacles and contact lenses: Z97.3

## 2015-02-03 HISTORY — DX: Unspecified hearing loss, unspecified ear: H91.90

## 2015-02-03 LAB — CBC
HCT: 40.2 % (ref 36.0–46.0)
Hemoglobin: 13.6 g/dL (ref 12.0–15.0)
MCH: 33 pg (ref 26.0–34.0)
MCHC: 33.8 g/dL (ref 30.0–36.0)
MCV: 97.6 fL (ref 78.0–100.0)
Platelets: 238 10*3/uL (ref 150–400)
RBC: 4.12 MIL/uL (ref 3.87–5.11)
RDW: 13.6 % (ref 11.5–15.5)
WBC: 6.8 10*3/uL (ref 4.0–10.5)

## 2015-02-03 LAB — COMPREHENSIVE METABOLIC PANEL
ALT: 16 U/L (ref 14–54)
AST: 22 U/L (ref 15–41)
Albumin: 3.7 g/dL (ref 3.5–5.0)
Alkaline Phosphatase: 38 U/L (ref 38–126)
Anion gap: 9 (ref 5–15)
BUN: 11 mg/dL (ref 6–20)
CO2: 26 mmol/L (ref 22–32)
Calcium: 9.1 mg/dL (ref 8.9–10.3)
Chloride: 106 mmol/L (ref 101–111)
Creatinine, Ser: 0.56 mg/dL (ref 0.44–1.00)
GFR calc Af Amer: >60 mL/min (ref >60)
GFR calc non Af Amer: >60 mL/min (ref >60)
Glucose, Bld: 90 mg/dL (ref 65–99)
Potassium: 3.9 mmol/L (ref 3.5–5.1)
Sodium: 141 mmol/L (ref 135–145)
Total Bilirubin: 0.6 mg/dL (ref 0.3–1.2)
Total Protein: 6.7 g/dL (ref 6.5–8.1)

## 2015-02-03 LAB — APTT: aPTT: 28 seconds (ref 24–37)

## 2015-02-03 LAB — PROTIME-INR
INR: 0.99 (ref 0.00–1.49)
Prothrombin Time: 13.3 seconds (ref 11.6–15.2)

## 2015-02-03 NOTE — Progress Notes (Signed)
PCP- Dr. Kathyrn Lass Cardiologist - denies  EKG- September 2015 - Epic CXR - 02/03/15 - Epic  Echo/Stress Test/Cardiac Cath - denies  Pt. Denies chest pain and shortness of breath at PAT appointment.

## 2015-02-03 NOTE — Pre-Procedure Instructions (Signed)
    Leslie Petersen  02/03/2015      [id number] [name] [address] [place] [id number]: [phone]: [phone]   Your procedure is scheduled on Monday, August 22nd, 2016  [facility]  Call this number if you have problems the morning of surgery:  [phone]   Remember:  Do not eat food or drink liquids after midnight.   Take these medicines the morning of surgery with A SIP OF WATER: Fexofenadine (Allegra), Hydrocortisone (Cortef), Pantoprazole (Protonix), Travoprost (Travatan).    Stop taking: NSAIDS, Aspirin, Ibuprofen, Naproxen, Aleve, BC's, Goody's, Fish Oil, all herbal medications and all vitamins.    Do not wear jewelry, make-up or nail polish.  Do not wear lotions, powders, or perfumes.  You may wear deodorant.  Do not shave 48 hours prior to surgery.    Do not bring valuables to the hospital.  [facility] for any belongings or valuables.  Contacts, dentures or bridgework may not be worn into surgery.  Leave your suitcase in the car.  After surgery it may be brought to your room.  For patients admitted to the hospital, discharge time will be determined by your treatment team.  Patients discharged the day of surgery will not be allowed to drive home.   Special instructions:  See attached.   Please read over the following fact sheets that you were given. Pain Booklet, Coughing and Deep Breathing and Surgical Site Infection Prevention

## 2015-02-04 ENCOUNTER — Encounter (HOSPITAL_COMMUNITY): Payer: Self-pay | Admitting: Anesthesiology

## 2015-02-06 ENCOUNTER — Ambulatory Visit (HOSPITAL_COMMUNITY): Payer: Medicare Other

## 2015-02-06 ENCOUNTER — Encounter (HOSPITAL_COMMUNITY)
Admission: RE | Disposition: A | Discharge status: Home / Self Care | Payer: Self-pay | Source: Ambulatory Visit | Attending: Cardiothoracic Surgery

## 2015-02-06 ENCOUNTER — Ambulatory Visit (HOSPITAL_COMMUNITY): Payer: Medicare Other | Admitting: Anesthesiology

## 2015-02-06 ENCOUNTER — Ambulatory Visit (HOSPITAL_COMMUNITY)
Admission: RE | Admit: 2015-02-06 | Discharge: 2015-02-06 | Disposition: A | Discharge status: Home / Self Care | Payer: Medicare Other | Source: Ambulatory Visit | Attending: Cardiothoracic Surgery | Admitting: Cardiothoracic Surgery

## 2015-02-06 DIAGNOSIS — Z9889 Other specified postprocedural states: Secondary | ICD-10-CM

## 2015-02-06 DIAGNOSIS — Z85118 Personal history of other malignant neoplasm of bronchus and lung: Secondary | ICD-10-CM | POA: Insufficient documentation

## 2015-02-06 DIAGNOSIS — Z79899 Other long term (current) drug therapy: Secondary | ICD-10-CM | POA: Insufficient documentation

## 2015-02-06 DIAGNOSIS — Z8589 Personal history of malignant neoplasm of other organs and systems: Secondary | ICD-10-CM | POA: Insufficient documentation

## 2015-02-06 DIAGNOSIS — R590 Localized enlarged lymph nodes: Secondary | ICD-10-CM | POA: Insufficient documentation

## 2015-02-06 DIAGNOSIS — R918 Other nonspecific abnormal finding of lung field: Secondary | ICD-10-CM | POA: Insufficient documentation

## 2015-02-06 DIAGNOSIS — K219 Gastro-esophageal reflux disease without esophagitis: Secondary | ICD-10-CM | POA: Insufficient documentation

## 2015-02-06 DIAGNOSIS — Z87891 Personal history of nicotine dependence: Secondary | ICD-10-CM | POA: Diagnosis not present

## 2015-02-06 DIAGNOSIS — R911 Solitary pulmonary nodule: Secondary | ICD-10-CM | POA: Diagnosis not present

## 2015-02-06 DIAGNOSIS — M1990 Unspecified osteoarthritis, unspecified site: Secondary | ICD-10-CM | POA: Insufficient documentation

## 2015-02-06 DIAGNOSIS — H9190 Unspecified hearing loss, unspecified ear: Secondary | ICD-10-CM | POA: Insufficient documentation

## 2015-02-06 DIAGNOSIS — Z419 Encounter for procedure for purposes other than remedying health state, unspecified: Secondary | ICD-10-CM

## 2015-02-06 HISTORY — PX: VIDEO BRONCHOSCOPY WITH ENDOBRONCHIAL ULTRASOUND: SHX6177

## 2015-02-06 HISTORY — PX: VIDEO BRONCHOSCOPY WITH ENDOBRONCHIAL NAVIGATION: SHX6175

## 2015-02-06 SURGERY — BRONCHOSCOPY, WITH EBUS
Anesthesia: General | Site: Bronchus

## 2015-02-06 MED ORDER — SUCCINYLCHOLINE CHLORIDE 20 MG/ML IJ SOLN
INTRAMUSCULAR | Status: DC | PRN
  Administered 2015-02-06: 100 mg via INTRAVENOUS

## 2015-02-06 MED ORDER — GLYCOPYRROLATE 0.2 MG/ML IJ SOLN
INTRAMUSCULAR | Status: DC | PRN
  Administered 2015-02-06: 0.2 mg via INTRAVENOUS

## 2015-02-06 MED ORDER — LACTATED RINGERS IV SOLN: INTRAVENOUS | Status: DC

## 2015-02-06 MED ORDER — FENTANYL CITRATE (PF) 250 MCG/5ML IJ SOLN
INTRAMUSCULAR | Status: AC
  Filled 2015-02-06: qty 5

## 2015-02-06 MED ORDER — 0.9 % SODIUM CHLORIDE (POUR BTL) OPTIME
TOPICAL | Status: DC | PRN
Start: 2015-02-06 — End: 2015-02-06
  Administered 2015-02-06: 1000 mL

## 2015-02-06 MED ORDER — STERILE WATER FOR INJECTION IJ SOLN
INTRAMUSCULAR | Status: AC
  Filled 2015-02-06: qty 10

## 2015-02-06 MED ORDER — VECURONIUM BROMIDE 10 MG IV SOLR
INTRAVENOUS | Status: DC | PRN
  Administered 2015-02-06: 4 mg via INTRAVENOUS
  Administered 2015-02-06: 2 mg via INTRAVENOUS

## 2015-02-06 MED ORDER — PHENYLEPHRINE HCL 10 MG/ML IJ SOLN
INTRAMUSCULAR | Status: DC | PRN
  Administered 2015-02-06 (×2): 80 ug via INTRAVENOUS

## 2015-02-06 MED ORDER — PROPOFOL 10 MG/ML IV BOLUS
INTRAVENOUS | Status: DC | PRN
  Administered 2015-02-06: 150 mg via INTRAVENOUS

## 2015-02-06 MED ORDER — FENTANYL CITRATE (PF) 100 MCG/2ML IJ SOLN: 25.0000 ug | INTRAMUSCULAR | Status: DC | PRN

## 2015-02-06 MED ORDER — ROCURONIUM BROMIDE 50 MG/5ML IV SOLN
INTRAVENOUS | Status: AC
  Filled 2015-02-06: qty 1

## 2015-02-06 MED ORDER — HYDROCORTISONE NA SUCCINATE PF 100 MG IJ SOLR
INTRAMUSCULAR | Status: DC | PRN
  Administered 2015-02-06: 100 mg via INTRAVENOUS

## 2015-02-06 MED ORDER — PROPOFOL 10 MG/ML IV BOLUS
INTRAVENOUS | Status: AC
  Filled 2015-02-06: qty 20

## 2015-02-06 MED ORDER — LACTATED RINGERS IV SOLN
INTRAVENOUS | Status: DC | PRN
  Administered 2015-02-06 (×2): via INTRAVENOUS

## 2015-02-06 MED ORDER — EPHEDRINE SULFATE 50 MG/ML IJ SOLN
INTRAMUSCULAR | Status: AC
  Filled 2015-02-06: qty 1

## 2015-02-06 MED ORDER — LIDOCAINE HCL (CARDIAC) 20 MG/ML IV SOLN
INTRAVENOUS | Status: AC
Start: 2015-02-06 — End: 2015-02-06
  Filled 2015-02-06: qty 10

## 2015-02-06 MED ORDER — SUCCINYLCHOLINE CHLORIDE 20 MG/ML IJ SOLN
INTRAMUSCULAR | Status: AC
  Filled 2015-02-06: qty 1

## 2015-02-06 MED ORDER — SUGAMMADEX SODIUM 200 MG/2ML IV SOLN
INTRAVENOUS | Status: DC | PRN
  Administered 2015-02-06: 111.6 mg via INTRAVENOUS

## 2015-02-06 MED ORDER — PHENYLEPHRINE 40 MCG/ML (10ML) SYRINGE FOR IV PUSH (FOR BLOOD PRESSURE SUPPORT)
PREFILLED_SYRINGE | INTRAVENOUS | Status: AC
  Filled 2015-02-06: qty 20

## 2015-02-06 MED ORDER — FENTANYL CITRATE (PF) 100 MCG/2ML IJ SOLN
INTRAMUSCULAR | Status: DC | PRN
  Administered 2015-02-06: 50 ug via INTRAVENOUS

## 2015-02-06 MED ORDER — VECURONIUM BROMIDE 10 MG IV SOLR
INTRAVENOUS | Status: AC
  Filled 2015-02-06: qty 10

## 2015-02-06 MED ORDER — ONDANSETRON HCL 4 MG/2ML IJ SOLN
INTRAMUSCULAR | Status: AC
  Filled 2015-02-06: qty 2

## 2015-02-06 MED ORDER — PROMETHAZINE HCL 25 MG/ML IJ SOLN: 6.2500 mg | INTRAMUSCULAR | Status: DC | PRN

## 2015-02-06 MED ORDER — ONDANSETRON HCL 4 MG/2ML IJ SOLN
INTRAMUSCULAR | Status: DC | PRN
  Administered 2015-02-06: 4 mg via INTRAVENOUS

## 2015-02-06 MED ORDER — LIDOCAINE HCL (CARDIAC) 20 MG/ML IV SOLN
INTRAVENOUS | Status: DC | PRN
  Administered 2015-02-06: 50 mg via INTRAVENOUS

## 2015-02-06 MED ORDER — SUGAMMADEX SODIUM 200 MG/2ML IV SOLN
INTRAVENOUS | Status: AC
  Filled 2015-02-06: qty 2

## 2015-02-06 SURGICAL SUPPLY — 34 items
BRUSH BIOPSY BRONCH 10 SDTNB (MISCELLANEOUS) ×2 IMPLANT
BRUSH CYTOL CELLEBRITY 1.5X140 (MISCELLANEOUS) IMPLANT
BRUSH SUPERTRAX BIOPSY (INSTRUMENTS) ×2 IMPLANT
BRUSH SUPERTRAX NDL-TIP CYTO (INSTRUMENTS) ×2 IMPLANT
CANISTER SUCTION 2500CC (MISCELLANEOUS) ×2 IMPLANT
CHANNEL WORK EXTEND EDGE 180 (KITS) IMPLANT
CHANNEL WORK EXTEND EDGE 45 (KITS) IMPLANT
CHANNEL WORK EXTEND EDGE 90 (KITS) IMPLANT
CONT SPEC 4OZ CLIKSEAL STRL BL (MISCELLANEOUS) ×6 IMPLANT
COVER TABLE BACK 60X90 (DRAPES) ×4 IMPLANT
FILTER STRAW FLUID ASPIR (MISCELLANEOUS) IMPLANT
FORCEPS BIOP RJ4 1.8 (CUTTING FORCEPS) IMPLANT
FORCEPS BIOP SUPERTRX PREMAR (INSTRUMENTS) IMPLANT
GAUZE SPONGE 4X4 12PLY STRL (GAUZE/BANDAGES/DRESSINGS) ×2 IMPLANT
GLOVE BIO SURGEON STRL SZ 6.5 (GLOVE) ×4 IMPLANT
KIT CLEAN ENDO COMPLIANCE (KITS) ×4 IMPLANT
KIT PROCEDURE EDGE 180 (KITS) IMPLANT
KIT PROCEDURE EDGE 45 (KITS) IMPLANT
KIT PROCEDURE EDGE 90 (KITS) ×2 IMPLANT
KIT ROOM TURNOVER OR (KITS) ×4 IMPLANT
MARKER SKIN DUAL TIP RULER LAB (MISCELLANEOUS) ×2 IMPLANT
NEEDLE BIOPSY TRANSBRONCH 21G (NEEDLE) IMPLANT
NEEDLE BLUNT 18X1 FOR OR ONLY (NEEDLE) IMPLANT
NEEDLE SONO TIP II EBUS (NEEDLE) ×2 IMPLANT
NEEDLE SUPERTRX PREMARK BIOPSY (NEEDLE) IMPLANT
NS IRRIG 1000ML POUR BTL (IV SOLUTION) ×4 IMPLANT
OIL SILICONE PENTAX (PARTS (SERVICE/REPAIRS)) ×4 IMPLANT
PAD ARMBOARD 7.5X6 YLW CONV (MISCELLANEOUS) ×8 IMPLANT
PATCHES PATIENT (LABEL) ×6 IMPLANT
SYR 20CC LL (SYRINGE) ×2 IMPLANT
SYR 20ML ECCENTRIC (SYRINGE) ×4 IMPLANT
TOWEL OR 17X24 6PK STRL BLUE (TOWEL DISPOSABLE) ×2 IMPLANT
TRAP SPECIMEN MUCOUS 40CC (MISCELLANEOUS) ×2 IMPLANT
TUBE CONNECTING 20X1/4 (TUBING) ×2 IMPLANT

## 2015-02-06 NOTE — Anesthesia Procedure Notes (Signed)
Procedure Name: Intubation Date/Time: 02/06/2015 7:40 AM Performed by: Eligha Bridegroom Pre-anesthesia Checklist: Emergency Drugs available, Patient identified, Timeout performed, Suction available and Patient being monitored Patient Re-evaluated:Patient Re-evaluated prior to inductionOxygen Delivery Method: Circle system utilized Preoxygenation: Pre-oxygenation with 100% oxygen Intubation Type: IV induction Ventilation: Mask ventilation without difficulty Laryngoscope Size: Mac and 4 Grade View: Grade II Tube type: Oral Tube size: 8.0 mm Number of attempts: 1 Airway Equipment and Method: Stylet and LTA kit utilized Placement Confirmation: ETT inserted through vocal cords under direct vision,  breath sounds checked- equal and bilateral and positive ETCO2 Secured at: 21 cm Tube secured with: Tape Dental Injury: Teeth and Oropharynx as per pre-operative assessment

## 2015-02-06 NOTE — Anesthesia Preprocedure Evaluation (Addendum)
Anesthesia Evaluation  Patient identified by MRN, date of birth, ID band Patient awake    Reviewed: Allergy & Precautions, NPO status , Patient's Chart, lab work & pertinent test results  Airway Mallampati: II  TM Distance: >3 FB Neck ROM: Full    Dental  (+) Teeth Intact   Pulmonary shortness of breath, former smoker,  breath sounds clear to auscultation        Cardiovascular Rhythm:Regular Rate:Normal     Neuro/Psych negative neurological ROS  negative psych ROS   GI/Hepatic Neg liver ROS, GERD-  Medicated,  Endo/Other  negative endocrine ROS  Renal/GU negative Renal ROS  negative genitourinary   Musculoskeletal  (+) Arthritis -,   Abdominal   Peds negative pediatric ROS (+)  Hematology negative hematology ROS (+)   Anesthesia Other Findings   Reproductive/Obstetrics negative OB ROS                            Lab Results  Component Value Date   WBC 6.8 02/03/2015   HGB 13.6 02/03/2015   HCT 40.2 02/03/2015   MCV 97.6 02/03/2015   PLT 238 02/03/2015   Lab Results  Component Value Date   CREATININE 0.56 02/03/2015   BUN 11 02/03/2015   NA 141 02/03/2015   K 3.9 02/03/2015   CL 106 02/03/2015   CO2 26 02/03/2015   EKG: normal sinus rhythm.   Anesthesia Physical Anesthesia Plan  ASA: II  Anesthesia Plan: General   Post-op Pain Management:    Induction: Intravenous  Airway Management Planned: Oral ETT  Additional Equipment:   Intra-op Plan:   Post-operative Plan: Extubation in OR  Informed Consent: I have reviewed the patients History and Physical, chart, labs and discussed the procedure including the risks, benefits and alternatives for the proposed anesthesia with the patient or authorized representative who has indicated his/her understanding and acceptance.   Dental advisory given  Plan Discussed with: CRNA  Anesthesia Plan Comments:          Anesthesia Quick Evaluation

## 2015-02-06 NOTE — H&P (Signed)
[address]       [place] [address]             [phone]                    Andilynn Swiech [place] Medical Record #[id number] Date of Birth: 02-Sep-1934  Referring: Dr Benay Spice Primary Care: Tawanna Solo, MD  Chief Complaint:  Lung mass  History of Present Illness:   Leslie Petersen 80 y.o. female  Was  seen in the office   for a right lung lesion and hypermetabolic mediastinal adenopathy. Patient has history of  Left upper lung adenocarcinoma (T1 N0), status post a wedge resection, 08/15/2009 by Dr Arlyce Dice. History of Right adrenocortical carcinoma, pT2, status post a right adrenalectomy, 10/09/2009.Left adrenocortical carcinoma, status post a left adrenalectomy in 1994.Biopsy of the left paraspinous mass 10/01/2013 confirmed metastatic carcinoma consistent with metastatic adrenocortical carcinoma Excision of the left paraspinous mass at [facility] on 12/03/2013 with the pathology confirming metastatic adrenocortical carcinoma with negative surgical margins.  5/3/ 2016 patient under went ebus and bx of mediastinal nodes, no malignancy Found.  Follow up ct done : A spiculated ground-glass nodule with internal lucencies in the right upper lobe measures 2.2 x 2.6 cm (series 5, image 24), previously 1.6 x 2.0 cm when remeasured.   Current Activity/ Functional Status:  Patient is independent with mobility/ambulation, transfers, ADL's, IADL's.   Zubrod Score: At the time of surgery this patient's most appropriate activity status/level should be described as: '[]'$     0    Normal activity, no symptoms '[x]'$     1    Restricted in physical strenuous activity but ambulatory, able to do out light work '[]'$     2    Ambulatory and capable of self care, unable to do work activities, up and about               >50 % of waking hours                              '[]'$     3    Only limited self care, in bed greater than 50% of waking hours '[]'$     4    Completely disabled, no self care,  confined to bed or chair '[]'$     5    Moribund   Past Medical History  Diagnosis Date  . Allergy     seasonal  . Cataract   . Glaucoma   . Osteoporosis   . Adenomatous colon polyp   . 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 Urinary frequency   . Esophageal polyp   . Constipation   . Dry skin   . Lung cancer, upper lobe, left 2011    adenocarcinoma resected   . Arthritis     back  . History of hiatal hernia   . GERD (gastroesophageal reflux disease)   . Wears glasses   . Hard of hearing     Past Surgical History  Procedure Laterality Date  . Lung cancer surgery  08/2009  . Adrenal gland surgery  1990/2011  . Colonoscopy  multiple  . Malignant tumor from back  June 2015    at [facility] "related to adrenal gland cancer" per pt  . Esophagogastroduodenoscopy    . Cataract extraction, bilateral Bilateral   . Esophagogastroduodenoscopy N/A 09/16/2014  Procedure: ESOPHAGOGASTRODUODENOSCOPY (EGD);  Surgeon: [name], MD;  Location: [facility];  Service: Endoscopy;  Laterality: N/A;  . Hot hemostasis N/A 09/16/2014    Procedure: HOT HEMOSTASIS (ARGON PLASMA COAGULATION/BICAP);  Surgeon: [name], MD;  Location: [facility];  Service: Endoscopy;  Laterality: N/A;  . Video bronchoscopy with endobronchial ultrasound N/A 10/18/2014    Procedure: VIDEO BRONCHOSCOPY WITH ENDOBRONCHIAL ULTRASOUND;  Surgeon: Grace Isaac, MD;  Location: [facility];  Service: Thoracic;  Laterality: N/A;  . Tubal ligation    . Eye surgery      Family History  Problem Relation Age of Onset  . Heart attack Mother   . Heart attack Father     Social History   Social History  . Marital Status: Divorced    Spouse Name: N/A  . Number of Children: N/A  . Years of Education: N/A   Occupational History  . Not on file.   Social History Main Topics  . Smoking status: Former Smoker    Quit date: 02/17/1989  . Smokeless tobacco: Never Used  . Alcohol  Use: No  . Drug Use: No  . Sexual Activity: Not on file   Other Topics Concern  . Not on file   Social History Narrative    History  Smoking status  . Former Smoker  . Quit date: 02/17/1989  Smokeless tobacco  . Never Used    History  Alcohol Use No     Allergies  Allergen Reactions  . Mirabegron Other (See Comments)    Patient had adverse effect to medication - doesn't recall reaction   . Urabeth [Bethanechol] Other (See Comments)    Pt doesn't remember the reaction    No current facility-administered medications for this encounter.      Review of Systems:     Cardiac Review of Systems: Y or N  Chest Pain [ n   ]  Resting SOB [ n  ] Exertional SOB  Blue.Reese  ]  Orthopnea Blue.Reese  ]   Pedal Edema [  n ]    Palpitations [ n ] Syncope  [ n ]   Presyncope [ n  ]  General Review of Systems: [Y] = yes [  ]=no Constitional: recent weight change Blue.Reese  ];  Wt loss over the last 3 months [   ] anorexia [  ]; fatigue [  ]; nausea [  ]; night sweats [  ]; fever [  ]; or chills [  ];          Dental: poor dentition[n  ]; Last Dentist visit:   Eye : blurred vision [  ]; diplopia [   ]; vision changes [  ];  Amaurosis fugax[  ]; Resp: cough [  ];  wheezing[  ];  hemoptysis[  ]; shortness of breath[  ]; paroxysmal nocturnal dyspnea[  ]; dyspnea on exertion[  ]; or orthopnea[  ];  GI:  gallstones[  ], vomiting[  ];  dysphagia[y  ]; melena[  ];  hematochezia [  ]; heartburn[  ];   Hx of  Colonoscopy[  ]; GU: kidney stones [  ]; hematuria[  ];   dysuria [ n ];  nocturia[  ];  history of     obstruction [  ]; urinary frequency [ n ]             Skin: rash, swelling[  ];, hair loss[  ];  peripheral edema[  ];  or itching[  ];  Musculosketetal: myalgias[  ];  joint swelling[  ];  joint erythema[  ];  joint pain[  ];  back pain[  ];  Heme/Lymph: bruising[  ];  bleeding[  ];  anemia[  ];  Neuro: TIA[n  ];  headaches[  ];  stroke[n  ];  vertigo[  ];  seizures[ n ];   paresthesias[  ];  difficulty  walking[ y ];  Psych:depression[  ]; anxiety[  ];  Endocrine: diabetes[  ];  thyroid dysfunction[  ];  Immunizations: Flu up to date Blue.Reese  ]; Pneumococcal up to date [ y ];  Other:  Physical Exam: BP 173/67 mmHg  Pulse 82  Temp(Src) 97.3 F (36.3 C)  Ht '5\' 3"'$  (1.6 m)  Wt 123 lb (55.792 kg)  BMI 21.79 kg/m2  SpO2 98%  PHYSICAL EXAMINATION: General appearance: alert, cooperative and appears older than stated age Head: Normocephalic, without obvious abnormality, atraumatic Neck: no adenopathy, no carotid bruit, no JVD, supple, symmetrical, trachea midline and thyroid not enlarged, symmetric, no tenderness/mass/nodules Lymph nodes: Cervical, supraclavicular, and axillary nodes normal. Resp: clear to auscultation bilaterally Back: symmetric, no curvature. ROM normal. No CVA tenderness. Low left back healed incision from previos resection Cardio: regular rate and rhythm, S1, S2 normal, no murmur, click, rub or gallop GI: soft, non-tender; bowel sounds normal; no masses,  no organomegaly Extremities: extremities normal, atraumatic, no cyanosis or edema and Homans sign is negative, no sign of DVT Neurologic: Grossly normal No cervical or supraclavicular adenopathy  Diagnostic Studies & Laboratory data:     Recent Radiology Findings:  Ct Chest W Contrast  01/25/2015   CLINICAL DATA:  Right upper lobe nodule, left lung cancer.  EXAM: CT CHEST WITH CONTRAST  TECHNIQUE: Multidetector CT imaging of the chest was performed during intravenous contrast administration.  CONTRAST:  19m OMNIPAQUE IOHEXOL 300 MG/ML  SOLN  COMPARISON:  PET 10/11/2014 and CT chest 09/28/2014.  FINDINGS: Mediastinum/Nodes: Mediastinal lymph nodes may be minimally more prominent, measuring up to 10 mm in short axis in the low left paratracheal station (Previously 8 mm). No hilar or axillary adenopathy. Coronary artery calcification. Heart size normal. No pericardial effusion. Moderate hiatal hernia.  Lungs/Pleura:  Centrilobular emphysema. A spiculated ground-glass nodule with internal lucencies in the right upper lobe measures 2.2 x 2.6 cm (series 5, image 24), previously 1.6 x 2.0 cm when remeasured. Smaller ground-glass nodular density in the right upper lobe (image 18), stable. Postoperative scarring and volume loss in the left hemi thorax, stable. No pleural fluid. Airway is otherwise unremarkable.  Upper abdomen: Visualized portions of the liver and gallbladder are unremarkable. Bilateral adrenalectomies. 5 mm low-attenuation lesion in the upper pole right kidney is too small to characterize but stable. Visualized portions of the kidneys, spleen and pancreas are otherwise unremarkable. Moderate hiatal hernia. No upper abdominal adenopathy.  Musculoskeletal: No worrisome lytic or sclerotic lesions. T12 compression fracture is unchanged.  IMPRESSION: 1. Enlarging spiculated right upper lobe ground-glass nodule, highly worrisome for bronchogenic carcinoma(adenocarcinoma in situ). 2. Mediastinal lymph nodes appear minimally more prominent than on 09/28/2014. 3. Coronary artery calcification.   Electronically Signed   By: MLorin PicketM.D.   On: 01/25/2015 10:06    Ct Chest W Contrast/Ct Abdomen Pelvis W Contrast  09/28/2014   CLINICAL DATA:  Left upper lobe lung adenocarcinoma status post resection. Bilateral adrenalectomy for adrenal cortical carcinoma 1994 and in 2011. Subsequent encounter.  EXAM: CT CHEST, ABDOMEN, AND PELVIS WITH CONTRAST  TECHNIQUE: Multidetector CT imaging of the chest,  abdomen and pelvis was performed following the standard protocol during bolus administration of intravenous contrast.  CONTRAST:  126m OMNIPAQUE IOHEXOL 300 MG/ML  SOLN  COMPARISON:  Chest CT 06/12/2014, PET-CT 10/13/2013 an abdominal pelvic CT 09/04/2013.  FINDINGS: CT CHEST FINDINGS  Mediastinum/Nodes: There are stable small AP window, right paratracheal and right hilar lymph nodes, not pathologically enlarged. No axillary  adenopathy. The thyroid gland and trachea demonstrate no significant findings. There is a moderate size hiatal hernia. The heart size is normal. There is no pericardial effusion.There is diffuse atherosclerosis of the aorta, great vessels and coronary arteries.  Lungs/Pleura: There is no pleural effusion.Stable postsurgical changes in the left hemithorax status post left upper lobe wedge resection. Progressive enlargement of sub solid right upper lobe nodule, measuring approximately 10 mm on image 22. This remains worrisome for adenocarcinoma. Small ground-glass nodule more superiorly in the right upper lobe on image number 16 is stable. There are no suspicious pulmonary nodules on the left. Moderate emphysematous changes are again noted.  Musculoskeletal/Chest wall: No chest wall mass or suspicious osseous findings. Chronic lower thoracic compression deformities and partially calcified disc protrusions are unchanged.  CT ABDOMEN AND PELVIS FINDINGS  Hepatobiliary: The liver is normal in density without focal abnormality. No evidence of gallstones, gallbladder wall thickening or biliary dilatation.  Pancreas: Unremarkable. No pancreatic ductal dilatation or surrounding inflammatory changes.  Spleen: Normal in size without focal abnormality.  Adrenals/Urinary Tract: There are stable postsurgical changes from previous bilateral adrenalectomy. Soft tissue nodule in the left adrenalectomy bed is stable, measuring 1.6 x 1.0 cm on image 55. This may be related to volume averaging with the pancreatic tail.The kidneys appear stable with bilateral cortical and renal sinus cysts. No hydronephrosis or urinary tract calculus identified. The bladder appears unremarkable.  Stomach/Bowel: No evidence of bowel wall thickening, distention or surrounding inflammatory change.A moderate size hiatal hernia as noted above. There are diverticular changes throughout the colon. The appendix appears normal.  Vascular/Lymphatic: There are no  enlarged abdominal or pelvic lymph nodes. Stable aortoiliac atherosclerosis.  Reproductive: The uterus and ovaries appear normal. Pelvic floor laxity suspected.  Other: No evidence of abdominal wall mass or hernia.  Musculoskeletal: No acute or significant osseous findings. Scattered endplate degenerative changes in the lumbar spine are stable. There is mild residual asymmetric soft tissue density inferiorly in the left erector spinae muscle, but no residual discrete mass or abnormal enhancement.  IMPRESSION: 1. Progressive enlargement of sub solid right upper lobe pulmonary nodule worrisome for adenocarcinoma. 2. Otherwise stable appearance of the chest status post wedge resection in the left upper lobe for lung cancer. No evidence of thoracic metastatic disease. 3. No discrete residual soft tissue mass identified within the left erector spinae muscle. 4. Otherwise stable abdominal pelvic CT status post bilateral adrenalectomy.   Electronically Signed   By: WRichardean SaleM.D.   On: 09/28/2014 10:37     Nm Pet Image Restag (ps) Skull Base To Thigh  10/11/2014   CLINICAL DATA:  Subsequent treatment strategy for lung cancer. Enlarging right lung nodule. Bilateral adrenalectomy leads.  EXAM: NUCLEAR MEDICINE PET SKULL BASE TO THIGH  TECHNIQUE: 6.4 mCi F-18 FDG was injected intravenously. Full-ring PET imaging was performed from the skull base to thigh after the radiotracer. CT data was obtained and used for attenuation correction and anatomic localization.  FASTING BLOOD GLUCOSE:  Value: 91 mg/dl  COMPARISON:  10/13/2013, CT 09/28/2014  FINDINGS: NECK  No hypermetabolic lymph nodes in the neck.  CHEST  11 mm nodule in the right upper lobe has associated metabolic activity SUV max 0.5.  There is hypermetabolic AP window node measuring 10 mm short axis SUV max equals 6. 5. The metabolic activity of is increased from comparison PET-CT of 10/13/2013 (SUV max 3 0). However the size of lymph node is similar measuring  10 mm compared to 10 mm.  ABDOMEN/PELVIS  Bilateral adrenal activities. No abnormal metabolic activity liver. No hypermetabolic abdominal pelvic nodes.  SKELETON  No focal hypermetabolic activity to suggest skeletal metastasis.  IMPRESSION: 1. Hypermetabolic right upper lobe pulmonary nodule is concerning for bronchogenic carcinoma. 2. Hypermetabolic AP window lymph node is increased in metabolic activity but similar in size to 10/13/2013. Finding is concerning for lung cancer recurrence.aa   Electronically Signed   By: Suzy Bouchard M.D.   On: 10/11/2014 16:08    I have independently reviewed the above radiology studies  and reviewed the findings with the patient.    Recent Lab Findings: Lab Results  Component Value Date   WBC 6.8 02/03/2015   HGB 13.6 02/03/2015   HCT 40.2 02/03/2015   PLT 238 02/03/2015   GLUCOSE 90 02/03/2015   ALT 16 02/03/2015   AST 22 02/03/2015   NA 141 02/03/2015   K 3.9 02/03/2015   CL 106 02/03/2015   CREATININE 0.56 02/03/2015   BUN 11 02/03/2015   CO2 26 02/03/2015   INR 0.99 02/03/2015      Assessment / Plan:  1/Increasing  right lung lesion and hypermetabolic mediastinal adenopathy. Patient has history of  Left upper lung adenocarcinoma (T1 N0) resected 2/ history of adrenocortical carcinoma, with bilateral adenectomy and excision of left back soft tissue recurrence 11/2013   I recommend to the patient proceeding with ebus and navigation bronchoscopy to re-biopsy mediastinal nodes and also to try and obtain a tissue diagnosis from the increasing right upper lobe lung lesion that is highly suspicious for a slow-growing adenocarcinoma the lung. Risks and options were discussed with the patient and her daughter in detail.   The goals risks and alternatives of the planned surgical procedure Bronchoscopy, EBUS and ENB  have been discussed with the patient in detail. The risks of the procedure including death, infection, stroke, myocardial infarction,  bleeding, blood transfusion have all been discussed specifically.  I have quoted [employer] a 1 % of perioperative mortality and a complication rate as high as 15 %. The patient's questions have been answered.Lachlan Pelto is willing  to proceed with the planned procedure.     Grace Isaac MD      [address] [place] [address] Office [phone]   Beeper [phone]  02/06/2015 7:06 AM

## 2015-02-06 NOTE — Transfer of Care (Signed)
Immediate Anesthesia Transfer of Care Note  Patient: Leslie Petersen  Procedure(s) Performed: Procedure(s): VIDEO BRONCHOSCOPY WITH ENDOBRONCHIAL ULTRASOUND (N/A) VIDEO BRONCHOSCOPY WITH ENDOBRONCHIAL NAVIGATION (N/A)  Patient Location: PACU  Anesthesia Type:General  Level of Consciousness: awake, alert  and oriented  Airway & Oxygen Therapy: Patient Spontanous Breathing and Patient connected to nasal cannula oxygen  Post-op Assessment: Report given to RN and Post -op Vital signs reviewed and stable  Post vital signs: Reviewed and stable  Last Vitals:  Filed Vitals:   02/06/15 0945  BP:   Pulse:   Temp: 11.6 C    Complications: No apparent anesthesia complications

## 2015-02-06 NOTE — Discharge Instructions (Signed)
Flexible Bronchoscopy, Care After ° °Refer to this sheet in the next few weeks. These instructions provide you with information on caring for yourself after your procedure. Your health care provider may also give you more specific instructions. Your treatment has been planned according to current medical practices, but problems sometimes occur. Call your health care provider if you have any problems or questions after your procedure.  °WHAT TO EXPECT AFTER THE PROCEDURE °It is normal to have the following symptoms for 24-48 hours after the procedure:  °· Increased cough. °· Low-grade fever. °· Sore throat or hoarse voice. °· Small streaks of blood in your thick spit (sputum) if tissue samples were taken (biopsy). °HOME CARE INSTRUCTIONS  °· Do not eat or drink anything for 2 hours after your procedure. Your nose and throat were numbed by medicine. If you try to eat or drink before the medicine wears off, food or drink could go into your lungs or you could burn yourself. After the numbness is gone and your cough and gag reflexes have returned, you may eat soft food and drink liquids slowly.   °· The day after the procedure, you can go back to your normal diet.   °· You may resume normal activities.   °· Keep all follow-up visits as directed by your health care provider. It is important to keep all your appointments, especially if tissue samples were taken for testing (biopsy). °SEEK IMMEDIATE MEDICAL CARE IF:  °· You have increasing shortness of breath.   °· You become light-headed or faint.   °· You have chest pain.   °· You have any new concerning symptoms. °· You cough up more than a small amount of blood. °· The amount of blood you cough up increases. °MAKE SURE YOU: °· Understand these instructions. °· Will watch your condition. °· Will get help right away if you are not doing well or get worse. °Document Released: 12/21/2004 Document Revised: 10/18/2013 Document Reviewed: 02/05/2013 °[employer]® Patient  Information ©2015 [employer]. This information is not intended to replace advice given to you by your health care provider. Make sure you discuss any questions you have with your health care provider. ° °

## 2015-02-06 NOTE — Brief Op Note (Signed)
      [address]       [place] [address]             [phone]      02/06/2015  9:27 AM  PATIENT:  Leslie Petersen  80 y.o. female  PRE-OPERATIVE DIAGNOSIS:   RIGHT LUNG MASS, mediastinal adenopathy  POST-OPERATIVE DIAGNOSIS:  same  PROCEDURE:  Procedure(s): VIDEO BRONCHOSCOPY WITH ENDOBRONCHIAL ULTRASOUND (N/A) VIDEO BRONCHOSCOPY WITH ENDOBRONCHIAL NAVIGATION (N/A)  SURGEON:  Surgeon(s) and Role:    * Grace Isaac, MD - Primary   ANESTHESIA:   general  EBL:     BLOOD ADMINISTERED:none  DRAINS: none   LOCAL MEDICATIONS USED:  NONE  SPECIMEN:  Source of Specimen:  right upper  lobe lung mass and  #5 L  lymph   DISPOSITION OF SPECIMEN:  PATHOLOGY  COUNTS:  YES  TOURNIQUET:  * No tourniquets in log *  DICTATION: .Dragon Dictation  PLAN OF CARE: Discharge to home after PACU  PATIENT DISPOSITION:  PACU - hemodynamically stable.   Delay start of Pharmacological VTE agent (>24hrs) due to surgical blood loss or risk of bleeding: yes

## 2015-02-06 NOTE — Anesthesia Postprocedure Evaluation (Signed)
  Anesthesia Post-op Note  Patient: Leslie Petersen  Procedure(s) Performed: Procedure(s): VIDEO BRONCHOSCOPY WITH ENDOBRONCHIAL ULTRASOUND (N/A) VIDEO BRONCHOSCOPY WITH ENDOBRONCHIAL NAVIGATION (N/A)  Patient Location: PACU  Anesthesia Type:General  Level of Consciousness: awake, alert  and oriented  Airway and Oxygen Therapy: Patient Spontanous Breathing  Post-op Pain: none  Post-op Assessment: Post-op Vital signs reviewed and Patient's Cardiovascular Status Stable              Post-op Vital Signs: Reviewed and stable  Last Vitals:  Filed Vitals:   02/06/15 1121  BP: 148/74  Pulse: 80  Temp: 36.5 C  Resp:     Complications: No apparent anesthesia complications

## 2015-02-07 ENCOUNTER — Encounter (HOSPITAL_COMMUNITY): Payer: Self-pay | Admitting: Cardiothoracic Surgery

## 2015-02-07 NOTE — Op Note (Signed)
NAME:  Leslie Petersen, Leslie Petersen NO.:  [id number]  MEDICAL RECORD NO.:  [id number]  LOCATION:  [facility]                         FACILITY:  [facility]  PHYSICIAN:  Lanelle Bal, MD    DATE OF BIRTH:  [birth year]  DATE OF PROCEDURE:  02/06/2015 DATE OF DISCHARGE:  02/06/2015                              OPERATIVE REPORT   PREOPERATIVE DIAGNOSES:  Right upper lobe lung mass, mediastinal adenopathy, history of previous lung cancer, and adrenocortical carcinoma.  PREOPERATIVE DIAGNOSES:  Right upper lobe lung mass, mediastinal adenopathy, history of previous lung cancer, and adrenocortical carcinoma.  SURGICAL PROCEDURES:  Video bronchoscopy with navigational bronchoscopy and lung biopsy, and EBUS with biopsy of mediastinal lymph nodes.  SURGEON:  Lanelle Bal, M.D.  BRIEF HISTORY:  The patient is a 80 year old female, with a known previous history of metastatic adrenocortical carcinoma, 3 years previously she had undergone wedge resection of a lesion in the left lung, which was by Dr. Arlyce Dice, which was adenocarcinoma of the lung. She has been followed with serial CT scans and recently a PET scan demonstrated hypermetabolic nodes along the left paratracheal area and also a slowly enlarging right upper lobe ground-glass lesion.  The patient's limited lung reserve and overall frail medical condition leaves her a poor operative candidate for resection.  Approximately 4 months ago, attempted EBUS and biopsy of the nodes, it did not reveal any malignant tissue.  Followup CT scan showed little change in the nodes, but the right upper lobe lung lesion had increased in size and appeared slightly more solid.  Recommended to the patient to attempt navigation to proceed with a navigational bronchoscopy and we biopsied the mediastinal nodes.  The patient agreed and signed informed consent.  DESCRIPTION OF PROCEDURE:  Appropriate preoperative navigation plan was developed  __________.   The patient was brought to the operating room. Appropriate time-out was performed.  She underwent general endotracheal anesthesia without incidence.  Single-lumen endotracheal tube was placed with initially a fiberoptic video bronchoscope was passed to the subsegmental level both in the left and right tracheobronchial tree without evidence of endobronchial lesions.  The scope was then positioned and the navigation sensor was placed down the bronchoscope tip and registration process was begun.  We then proceeded to navigate to within 0.3 cm of the target lesion in the right upper lobe with the working channel locked in place.  We then proceeded with under fluoroscopic guidance. Biopsy of the targeted lesion first with needle brush and then a triple brush initial smears of the slides were submitted to Pathology and the remaining of the atypical cells were noted, but no definite malignancy was initially identified on a quick smears.  Additional passes were made for permanent smears.  In addition, through the working channel, multiple biopsies with a small biopsy forceps were also obtained and submitted to Pathology for permanent review.  BLA of the targeted area was also performed and submitted for cytology.  We then removed and placed the sensing tip confirming that the working channel had remained in the site that we originally selected.  We then removed the scope and passed the EBUS scope and noted enlarged lymph nodes, labeled 5 L  along the left paratracheal area, just above takeoff the left upper lobe bronchus.  Multiple passes with the transbronchial needle for aspiration were obtained and submitted for permanent review.  The scope was removed.  The patient tolerated the procedure without obvious complication, was extubated in the operating room and transferred to the recovery room for postoperative care.     Lanelle Bal, MD     EG/MEDQ  D:  02/07/2015  T:  02/07/2015   Job:  [id number]

## 2015-02-15 ENCOUNTER — Telehealth: Payer: Self-pay | Admitting: Oncology

## 2015-02-15 ENCOUNTER — Ambulatory Visit (HOSPITAL_BASED_OUTPATIENT_CLINIC_OR_DEPARTMENT_OTHER): Payer: Medicare Other | Admitting: Oncology

## 2015-02-15 VITALS — BP 121/80 | HR 96 | Temp 98.3°F | Resp 18 | Ht 63.0 in | Wt 123.7 lb

## 2015-02-15 DIAGNOSIS — Z85118 Personal history of other malignant neoplasm of bronchus and lung: Secondary | ICD-10-CM | POA: Diagnosis not present

## 2015-02-15 DIAGNOSIS — Z85858 Personal history of malignant neoplasm of other endocrine glands: Secondary | ICD-10-CM | POA: Diagnosis not present

## 2015-02-15 DIAGNOSIS — C3490 Malignant neoplasm of unspecified part of unspecified bronchus or lung: Secondary | ICD-10-CM

## 2015-02-15 DIAGNOSIS — R599 Enlarged lymph nodes, unspecified: Secondary | ICD-10-CM | POA: Diagnosis not present

## 2015-02-15 DIAGNOSIS — R918 Other nonspecific abnormal finding of lung field: Secondary | ICD-10-CM

## 2015-02-15 NOTE — Telephone Encounter (Signed)
Gave patient avs/appointments for December and appointment for Dr. Lisbeth Renshaw 9/7 @ 2:30 pm - patient is aware to arrive @ 2:15pm and she will see the nurse and then Dr. Lisbeth Renshaw - nurse assessment part not yet in the system.

## 2015-02-16 NOTE — Progress Notes (Signed)
[facility] OFFICE PROGRESS NOTE   Diagnosis: Non-small cell lung cancer, adrenal cancer  INTERVAL HISTORY:   Leslie Petersen returns as scheduled. She feels well. No complaint. She underwent a bronchoscopy and biopsy of the right lung lesion and mediastinal lymph nodes by Dr. Servando Snare on 02/06/2015. The biopsy from the right lung mass was negative for atypia or malignancy. The cytology revealed "atypical "cells, but was nondiagnostic. The cytology from the level VL lymph node was negative. Objective:  Vital signs in last 24 hours:  Blood pressure 121/80, pulse 96, temperature 98.3 F (36.8 C), temperature source Oral, resp. rate 18, height '5\' 3"'$  (1.6 m), weight 123 lb 11.2 oz (56.11 kg), SpO2 97 %.    HEENT: Neck without mass Lymphatics: No cervical, supra-clavicular, axillary, or inguinal nodes Resp: Lungs clear bilaterally Cardio: Regular rate and rhythm GI: No hepatomegaly, nontender, no mass Vascular: No leg edema Musculoskeletal: No mass at the left lower back incision site      Lab Results:  Lab Results  Component Value Date   WBC 6.8 02/03/2015   HGB 13.6 02/03/2015   HCT 40.2 02/03/2015   MCV 97.6 02/03/2015   PLT 238 02/03/2015   NEUTROABS 2.6 01/25/2015     Medications: I have reviewed the patient's current medications.  Assessment/Plan: 1. 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 2. Left upper lung adenocarcinoma (T1 N0), status post a wedge resection, 08/15/2009. 3. Left adrenocortical carcinoma, status post a left adrenalectomy in 1994. 4. CT of the chest and abdomen, 09/04/2012. No evidence of tumor recurrence or metastatic disease in the chest or abdomen. 5. Soft tissue nodule noted on the CT 09/04/2012, posterior to the left sacrum-present on a PET/CT 07/06/2009 without hypermetabolic activity. Larger on the pelvic CT 09/04/2013, the lesion had associated enhancement on an MRI 09/16/2013  Biopsy  of the left paraspinous mass 10/01/2013 confirmed metastatic carcinoma consistent with metastatic adrenocortical carcinoma  Excision of the left paraspinous mass at [facility] on 12/03/2013 with the pathology confirming metastatic adrenocortical carcinoma with negative surgical margins  Restaging CTs 09/28/2014 with an enlarging right upper lobe nodule and no other evidence of metastatic disease  PET scan 10/11/2014 confirmed a hypermetabolic right upper lobe nodule and a hypermetabolic AP window lymph node  EBUS biopsy of a 4L node on 10/18/2014 revealed rare atypical cells-nondiagnostic  CT 01/25/2015 confirmed enlargement of the right upper lung nodule and slight enlargement of mediastinal lymph nodes  EBUS biopsy of the right lung mass and a level 5L node on 02/06/2015 were nondiagnostic 6. Enterococcus urinary tract infection 09/04/2013 7. Esophagitis,? Secondary to Fosamax, improved with Protonix 8. CT of the chest 06/12/2014 with an indeterminate right lung nodule  Progressive enlargement of a sub-solid right upper lobe nodule noted on the CT 09/28/2014 9. Duodenal polyp, status post a biopsy confirming a tubular adenoma on an endoscopy 06/22/2014  Removal of a duodenal polyp 09/16/2014-pathology revealed a duodenal adenoma, no high-grade dysplasia or malignancy identified      Disposition:  She appears stable. Her case was presented at the GI tumor conference 02/15/2015. The right upper lung mass is very likely malignant. The appearance is most consistent with a primary lung cancer, but this could represent a metastasis from the previous lung cancer or adrenal carcinoma. The mediastinal lymph node could also be related to metastatic disease from either previous primary.  We discussed observation versus stereotactic radiation to the right lung mass. She would like to consider definitive radiation  for treatment of the right lung mass. The mediastinal lymph node be observed and  biopsied in the future if there are significant growth. We made a referral to Dr. Lisbeth Renshaw.  She will return for an office visit here in approximately 4 months.    Betsy Coder, MD  02/16/2015  8:47 AM

## 2015-02-21 ENCOUNTER — Telehealth: Payer: Self-pay | Admitting: *Deleted

## 2015-02-21 NOTE — Telephone Encounter (Signed)
Ok to see Dr. Sondra Come or Dr. Lisbeth Renshaw

## 2015-02-21 NOTE — Telephone Encounter (Signed)
Called pt to let her know per Dr. Benay Spice, she may see either Dr. Sondra Come or Dr. Lisbeth Renshaw; advised her since she is scheduled with Dr. Sondra Come to just keep that MD. Left callback number if any questions or concerns arise.

## 2015-02-21 NOTE — Telephone Encounter (Signed)
Patient called.  "I am confused.  I just received a call from Santiago Glad that she will call me with a different date and time for RT.  Dr. Benay Spice said I am to see Dr. Lisbeth Renshaw but Santiago Glad says I am to see Dr. Sondra Come because I saw him in the GI Conference meeting.  Am I to see Dr. Gearldine Shown preference of [facility] or Dr. Sondra Come?"  This nurse advised that if she started with [facility] she should remain with Dr. Sondra Come but will notify staff of this request and need for further clarification.

## 2015-02-21 NOTE — Progress Notes (Addendum)
Thoracic Location of Tumor / Histology: caught routine cat scan  Patient presented  months ago with symptoms of: none caught at cat scan 8 /10/16  Biopsies of (if applicable) revealed: Diagnosis 02/06/15: Lung, biopsy, Right upper lobe mass- BENIGN LUNG TISSUE, SEE COMMENT.- NEGATIVE FOR ATYPIA OR MALIGNANCY. Diagnosis TRANSBRONCHIAL NEEDLE ASPIRATION (B), NAVIGATION #1, RUL BRUSHING #2 (SPECIMEN OF 4 COLLECTED ON 02/06/15) RARE ATYPICAL CELLS PRESENT SEE COMMENT COMMENT: THERE ARE RARE ATYPICAL CELLS PRESENT THAT ARE NOT DIAGNOTIC OF MALIGNANCY. THE CASE WAS REVIEWED BY DR Avis Epley, WHO CONCURS. Preliminary Diagnosis 02/06/15 SOME ATYPICAL CELLS REQUEST ADDITIONAL MATERIAL, IF POSSIBLE  Diagnosis 02/06/15: BRONCHIAL LAVAGE, C (SPECIMEN 3 OF 4, COLLECTED ON 02/06/15): NO MALIGNANT CELLS IDENTIFIED,Diagnosis FINE NEEDLE ASPIRATION, ENDOSCOPIC EBUS (D), NODE #5, (SPECIMEN 4 OF 4, COLLECTED ON 02/06/15): NO MALIGNANT CELLS IDENTIFIED BIOPSY 10/01/2013: left paraspinal mass= confirmed metastatic adrenocortical carcinoma with neg surgical margins  Tobacco/Marijuana/Snuff/ETOH use: quit smoking 25 years ago,no alcohol or illicit drugs, no smokeless tobacco Past/Anticipated interventions by cardiothoracic surgery, if any: Dr. Arlyce Dice =Wedge Resection 08/15/2009 left upper lung, (T1NO)  Right adrenocortical ca, s/p right adrenalectomy 10/09/09; Left adrenlectomy  In 1994. Past/Anticipated interventions by medical oncology, if any: Dr. Benay Spice   Signs/Symptoms  Weight changes, if any:   Respiratory complaints, if any:   Hemoptysis, if any:  Pain issues, if any:    SAFETY ISSUES: Yes, unsteady walks with a cane  Prior radiation? NO  Pacemaker/ICD? NO  Possible current pregnancy? NO  Is the patient on methotrexate?  NO  Current Complaints / other details:  Seen in Lung clinic  By Dr. Sondra Come 10/13/14 as a new consult Lung Cancer , Hx stage 1 Upper Lung Adenocarcinoma,, ;hx of metastatic adrenal  cortical carcinoma left paraspinal mass,   Allergies: Mirabegron and Urabeth  BP 182/86 mmHg  Pulse 100  Temp(Src) 98.3 F (36.8 C) (Oral)  Resp 20  Ht '5\' 3"'$  (1.6 m)  Wt 120 lb 11.2 oz (54.749 kg)  BMI 21.39 kg/m2  SpO2 96%  Wt Readings from Last 3 Encounters:  02/22/15 120 lb 11.2 oz (54.749 kg)  02/15/15 123 lb 11.2 oz (56.11 kg)  02/06/15 123 lb (55.792 kg)

## 2015-02-22 ENCOUNTER — Ambulatory Visit
Admission: RE | Admit: 2015-02-22 | Discharge: 2015-02-22 | Disposition: A | Discharge status: Home / Self Care | Payer: Medicare Other | Source: Ambulatory Visit | Attending: Radiation Oncology | Admitting: Radiation Oncology

## 2015-02-22 ENCOUNTER — Encounter: Payer: Self-pay | Admitting: Radiation Oncology

## 2015-02-22 VITALS — BP 182/86 | HR 100 | Temp 98.3°F | Resp 20 | Ht 63.0 in | Wt 120.7 lb

## 2015-02-22 DIAGNOSIS — C7490 Malignant neoplasm of unspecified part of unspecified adrenal gland: Secondary | ICD-10-CM | POA: Insufficient documentation

## 2015-02-22 DIAGNOSIS — C3491 Malignant neoplasm of unspecified part of right bronchus or lung: Secondary | ICD-10-CM

## 2015-02-22 DIAGNOSIS — Z510 Encounter for antineoplastic radiation therapy: Secondary | ICD-10-CM | POA: Insufficient documentation

## 2015-02-22 DIAGNOSIS — C3411 Malignant neoplasm of upper lobe, right bronchus or lung: Secondary | ICD-10-CM

## 2015-02-22 NOTE — Progress Notes (Signed)
Radiation Oncology         [phone]) [phone] ________________________________  Name: Leslie Petersen MRN: [id number]  Date: 02/22/2015  DOB: [birth year]  Reconsultation Visit Note  CC: Tawanna Solo, MD  Ladell Pier, MD  Diagnosis:   Non-small cell lung cancer, adrenal cancer bilateral  Narrative:  The patient returns today for reconsultation.The patient seen by Dr. Sondra Come 10/13/14 as a new consult for lung cancer. She has a significant past history of metastatic adrenocortical carcinoma, 3 years previously she had undergone wedge resection of a lesion in the left lung, which was done by Dr. Arlyce Dice, this was adenocarcinoma of the lung. She has been followed with serial CT scans and recently a PET scan demonstrated hypermetabolic nodes along the left paratracheal area and also a slowly enlarging right upper lobe ground-glass lesion. The patient's limited lung reserve and overall frail medical condition leaves her a poor operative candidate for resection.   She underwent a bronchoscopy and biopsy of the right lung lesion and mediastinal lymph nodes by Dr. Servando Snare on 02/06/2015. The biopsy from the right lung mass was negative for atypia or malignancy. The cytology revealed "atypical "cells, but was nondiagnostic. The cytology for lymph nodes was negative.   Follow up CT scan showed little change in the nodes, but the right upper lobe lung lesion had increased in size and appeared slightly more solid.  Dr. Benay Spice has discussed observation versus stereotactic radiation to the right lung mass with the patient and has referred to radiation oncology.   Patient denies SOB, no pain in chest.    ALLERGIES:  is allergic to mirabegron and urabeth.  Meds: Current Outpatient Prescriptions  Medication Sig Dispense Refill  . aspirin EC 81 MG tablet Take 1 tablet (81 mg total) by mouth at bedtime. Stop and restart on September 30, 2014    . Calcium Carb-Cholecalciferol (CALCIUM 600 + D PO) Take 600 mg by  mouth daily after lunch.    . docusate sodium (COLACE) 100 MG capsule Take 100 mg by mouth daily as needed for mild constipation.     . Ergocalciferol (VITAMIN D2) 2000 UNITS TABS Take 2,000 Units by mouth daily after lunch.    . fexofenadine (ALLEGRA) 180 MG tablet Take 180 mg by mouth daily.    . fludrocortisone (FLORINEF) 0.1 MG tablet Take 0.1 mg by mouth daily after lunch.     . hydrocortisone (CORTEF) 10 MG tablet Take 15 mg by mouth daily at 6 (six) AM. Takes an additional 5 mg in the afternoon    . ibuprofen (ADVIL,MOTRIN) 200 MG tablet Take 400 mg by mouth daily as needed (pain).    . Multiple Vitamins-Minerals (MULTIVITAMIN WITH MINERALS) tablet Take 1 tablet by mouth daily after lunch.     . Omega-3 Fatty Acids (FISH OIL) 1200 MG CAPS Take 1,200 mg by mouth daily after lunch.    . pantoprazole (PROTONIX) 20 MG tablet Take 1 tablet (20 mg total) by mouth daily. (Patient taking differently: Take 20 mg by mouth daily at 6 (six) AM. ) 30 tablet 11  . polycarbophil (FIBERCON) 625 MG tablet Take 1,250 mg by mouth daily as needed for mild constipation.     . Travoprost, BAK Free, (TRAVATAN) 0.004 % SOLN ophthalmic solution Place 1 drop into both eyes at bedtime.    . Vitamins-Lipotropics (LIPO-FLAVONOID PLUS PO) Take 1 tablet by mouth daily as needed (ringing in ears).     . Methen-Hyosc-Meth Blue-Na Phos (UROLET MB) 81.6 MG TABS Take 81.6 mg  by mouth 2 (two) times daily.  3   No current facility-administered medications for this encounter.    Physical Findings: The patient is in no acute distress. Patient is alert and oriented.  height is '5\' 3"'$  (1.6 m) and weight is 120 lb 11.2 oz (54.749 kg). Her oral temperature is 98.3 F (36.8 C). Her blood pressure is 182/86 and her pulse is 100. Her respiration is 20 and oxygen saturation is 96%. .   General: Well-developed, in no acute distress HEENT: Normocephalic, atraumatic Cardiovascular: Regular rate and rhythm Respiratory: Clear to  auscultation bilaterally GI: Soft, nontender, normal bowel sounds Extremities: No edema present   Lab Findings: Lab Results  Component Value Date   WBC 6.8 02/03/2015   HGB 13.6 02/03/2015   HCT 40.2 02/03/2015   MCV 97.6 02/03/2015   PLT 238 02/03/2015     Radiographic Findings: Dg Chest 2 View  02/03/2015   CLINICAL DATA:  Lung biopsy.  Lung cancer 2011.  EXAM: CHEST  2 VIEW  COMPARISON:  CT 01/25/2015.  Chest x-ray 10/17/2014.  FINDINGS: Mediastinum hilar structures are normal. A spiculated density right upper lobe again noted. Basal pleural-parenchymal thickening consistent with scarring. Heart size normal. Hiatal hernia noted. Degenerative changes thoracic spine. Stable lower thoracic vertebral body compression fracture. Surgical clips upper abdomen .  IMPRESSION: Persistent spiculated density in the right upper lobe . Malignancy could present this fashion. Reference is made to CT report of 01/25/2015.   Electronically Signed   By: Marcello Moores  Register   On: 02/03/2015 11:58   Ct Chest W Contrast  01/25/2015   CLINICAL DATA:  Right upper lobe nodule, left lung cancer.  EXAM: CT CHEST WITH CONTRAST  TECHNIQUE: Multidetector CT imaging of the chest was performed during intravenous contrast administration.  CONTRAST:  38m OMNIPAQUE IOHEXOL 300 MG/ML  SOLN  COMPARISON:  PET 10/11/2014 and CT chest 09/28/2014.  FINDINGS: Mediastinum/Nodes: Mediastinal lymph nodes may be minimally more prominent, measuring up to 10 mm in short axis in the low left paratracheal station (Previously 8 mm). No hilar or axillary adenopathy. Coronary artery calcification. Heart size normal. No pericardial effusion. Moderate hiatal hernia.  Lungs/Pleura: Centrilobular emphysema. A spiculated ground-glass nodule with internal lucencies in the right upper lobe measures 2.2 x 2.6 cm (series 5, image 24), previously 1.6 x 2.0 cm when remeasured. Smaller ground-glass nodular density in the right upper lobe (image 18), stable.  Postoperative scarring and volume loss in the left hemi thorax, stable. No pleural fluid. Airway is otherwise unremarkable.  Upper abdomen: Visualized portions of the liver and gallbladder are unremarkable. Bilateral adrenalectomies. 5 mm low-attenuation lesion in the upper pole right kidney is too small to characterize but stable. Visualized portions of the kidneys, spleen and pancreas are otherwise unremarkable. Moderate hiatal hernia. No upper abdominal adenopathy.  Musculoskeletal: No worrisome lytic or sclerotic lesions. T12 compression fracture is unchanged.  IMPRESSION: 1. Enlarging spiculated right upper lobe ground-glass nodule, highly worrisome for bronchogenic carcinoma(adenocarcinoma in situ). 2. Mediastinal lymph nodes appear minimally more prominent than on 09/28/2014. 3. Coronary artery calcification.   Electronically Signed   By: MLorin PicketM.D.   On: 01/25/2015 10:06   Dg Chest Port 1 View  02/06/2015   CLINICAL DATA:  Lung biopsy.  Postop.  EXAM: PORTABLE CHEST - 1 VIEW  COMPARISON:  02/03/2015  FINDINGS: Spiculated nodule in the right upper lobe faintly visible. There is no other focal parenchymal opacity. There is no pleural effusion or pneumothorax. The heart and mediastinal  contours are unremarkable.  The osseous structures are unremarkable.  IMPRESSION: No evidence of pneumothorax.  Stable faintly visible spiculated right upper lobe pulmonary nodule.   Electronically Signed   By: Kathreen Devoid   On: 02/06/2015 11:07   Dg C-arm Bronchoscopy  02/06/2015   CLINICAL DATA:    C-ARM BRONCHOSCOPY  Fluoroscopy was utilized by the requesting physician.  No radiographic  interpretation.     Impression:  The patient has a significant past history of non-small cell lung cancer and adrenal cancer. Past interventions include: wedge resection of left upper lung (08/15/09) right adrenalectomy (10/09/09), left adrenalectomy in 1994. The patient has undergone two biopsies of a right upper lung mass  and mediastinal node which have been non-diagnostic. Recent CT scan however shows evidence of an enhancing mass in the right upper lobe of the lung which is highly worrisome for bronchogenic carcinoma however a biopsy has not definitely proven this. This was discussed in detail in GI conference, and it is felt to most likely represent a malignancy, most consistent with adenocarcinoma.  After detailed discussion of performing another biopsy versus beginning SBRT, the patient has opted to proceed with radiation treatment to the right lung mass.   Plan: We discussed the possible side effects and risks of treatment in addition to the possible benefits of treatment. We discussed the protocol for radiation treatment.  All of the patient's questions were answered. The patient does wish to proceed with this treatment. A simulation will be scheduled such that we can proceed with treatment planning.  I anticipate a course of 3 treatments of SBRT to the right lung mass.    ------------------------------------------------  Jodelle Gross, MD, PhD  This document serves as a record of services personally performed by Kyung Rudd, MD. It was created on his behalf by Derek Mound, a trained medical scribe. The creation of this record is based on the scribe's personal observations and the provider's statements to them. This document has been checked and approved by the attending provider.

## 2015-02-22 NOTE — Progress Notes (Signed)
Please see the Nurse Progress Note in the MD Initial Consult Encounter for this patient. 

## 2015-02-23 ENCOUNTER — Encounter: Payer: Self-pay | Admitting: Radiation Oncology

## 2015-02-23 DIAGNOSIS — C3411 Malignant neoplasm of upper lobe, right bronchus or lung: Secondary | ICD-10-CM | POA: Insufficient documentation

## 2015-02-28 ENCOUNTER — Ambulatory Visit
Admission: RE | Admit: 2015-02-28 | Discharge: 2015-02-28 | Disposition: A | Discharge status: Home / Self Care | Payer: Medicare Other | Source: Ambulatory Visit | Attending: Radiation Oncology | Admitting: Radiation Oncology

## 2015-02-28 DIAGNOSIS — C3412 Malignant neoplasm of upper lobe, left bronchus or lung: Secondary | ICD-10-CM

## 2015-02-28 DIAGNOSIS — C3411 Malignant neoplasm of upper lobe, right bronchus or lung: Secondary | ICD-10-CM

## 2015-02-28 DIAGNOSIS — Z510 Encounter for antineoplastic radiation therapy: Secondary | ICD-10-CM | POA: Diagnosis not present

## 2015-03-03 ENCOUNTER — Telehealth: Payer: Self-pay | Admitting: *Deleted

## 2015-03-03 NOTE — Telephone Encounter (Signed)
PT. HAD A QUESTION CONCERNING THE TATTOO THAT "DR.SHERRILL ORDERED HER TO HAVE".

## 2015-03-10 DIAGNOSIS — Z510 Encounter for antineoplastic radiation therapy: Secondary | ICD-10-CM | POA: Diagnosis not present

## 2015-03-13 ENCOUNTER — Encounter: Payer: Self-pay | Admitting: Radiation Oncology

## 2015-03-13 ENCOUNTER — Ambulatory Visit
Admission: RE | Admit: 2015-03-13 | Discharge: 2015-03-13 | Disposition: A | Discharge status: Home / Self Care | Payer: Medicare Other | Source: Ambulatory Visit | Attending: Radiation Oncology | Admitting: Radiation Oncology

## 2015-03-13 VITALS — BP 153/83 | HR 93 | Temp 98.0°F | Resp 20 | Ht 63.0 in | Wt 123.2 lb

## 2015-03-13 DIAGNOSIS — Z510 Encounter for antineoplastic radiation therapy: Secondary | ICD-10-CM | POA: Diagnosis not present

## 2015-03-13 DIAGNOSIS — C3411 Malignant neoplasm of upper lobe, right bronchus or lung: Secondary | ICD-10-CM

## 2015-03-13 NOTE — Progress Notes (Addendum)
SBRT # 1 / 3 completed  LUL , no c/o pain, coughing nausea, appetite good, energy level  Okay stated, has hot flashes again from the sterpoids 12:46 PM BP 153/83 mmHg  Pulse 93  Temp(Src) 98 F (36.7 C) (Oral)  Resp 20  Ht '5\' 3"'$  (1.6 m)  Wt 123 lb 3.2 oz (55.883 kg)  BMI 21.83 kg/m2  SpO2 99%  Wt Readings from Last 3 Encounters:  03/13/15 123 lb 3.2 oz (55.883 kg)  02/22/15 120 lb 11.2 oz (54.749 kg)  02/15/15 123 lb 11.2 oz (56.11 kg)

## 2015-03-13 NOTE — Progress Notes (Signed)
Department of Radiation Oncology  Phone:  [phone] Fax:        [phone]  Weekly Treatment Note    Name: Leslie Petersen Date: 03/13/2015 MRN: [id number] DOB: [birth year]   Current dose: 18 Gy  Current fraction:1   MEDICATIONS: Current Outpatient Prescriptions  Medication Sig Dispense Refill  . aspirin EC 81 MG tablet Take 1 tablet (81 mg total) by mouth at bedtime. Stop and restart on September 30, 2014    . Calcium Carb-Cholecalciferol (CALCIUM 600 + D PO) Take 600 mg by mouth daily after lunch.    . docusate sodium (COLACE) 100 MG capsule Take 100 mg by mouth daily as needed for mild constipation.     . Ergocalciferol (VITAMIN D2) 2000 UNITS TABS Take 2,000 Units by mouth daily after lunch.    . fexofenadine (ALLEGRA) 180 MG tablet Take 180 mg by mouth daily.    . fludrocortisone (FLORINEF) 0.1 MG tablet Take 0.1 mg by mouth daily after lunch.     . hydrocortisone (CORTEF) 10 MG tablet Take 15 mg by mouth daily at 6 (six) AM. Takes an additional 5 mg in the afternoon    . ibuprofen (ADVIL,MOTRIN) 200 MG tablet Take 400 mg by mouth daily as needed (pain).    . Methen-Hyosc-Meth Blue-Na Phos (UROLET MB) 81.6 MG TABS Take 81.6 mg by mouth 2 (two) times daily.  3  . Multiple Vitamins-Minerals (MULTIVITAMIN WITH MINERALS) tablet Take 1 tablet by mouth daily after lunch.     . Omega-3 Fatty Acids (FISH OIL) 1200 MG CAPS Take 1,200 mg by mouth daily after lunch.    . pantoprazole (PROTONIX) 20 MG tablet Take 1 tablet (20 mg total) by mouth daily. (Patient taking differently: Take 20 mg by mouth daily at 6 (six) AM. ) 30 tablet 11  . polycarbophil (FIBERCON) 625 MG tablet Take 1,250 mg by mouth daily as needed for mild constipation.     . Travoprost, BAK Free, (TRAVATAN) 0.004 % SOLN ophthalmic solution Place 1 drop into both eyes at bedtime.    . Vitamins-Lipotropics (LIPO-FLAVONOID PLUS PO) Take 1 tablet by mouth daily as needed (ringing in ears).      No current  facility-administered medications for this encounter.     ALLERGIES: Mirabegron and Urabeth   LABORATORY DATA:  Lab Results  Component Value Date   WBC 6.8 02/03/2015   HGB 13.6 02/03/2015   HCT 40.2 02/03/2015   MCV 97.6 02/03/2015   PLT 238 02/03/2015   Lab Results  Component Value Date   NA 141 02/03/2015   K 3.9 02/03/2015   CL 106 02/03/2015   CO2 26 02/03/2015   Lab Results  Component Value Date   ALT 16 02/03/2015   AST 22 02/03/2015   ALKPHOS 38 02/03/2015   BILITOT 0.6 02/03/2015     NARRATIVE: Leslie Petersen was seen today for weekly treatment management. The chart was checked and the patient's films were reviewed.  SBRT # 1 / 3 completed  LUL , no c/o pain, coughing nausea, appetite good, energy level  Okay stated, has hot flashes again from the sterpoids 6:16 PM BP 153/83 mmHg  Pulse 93  Temp(Src) 98 F (36.7 C) (Oral)  Resp 20  Ht '5\' 3"'$  (1.6 m)  Wt 123 lb 3.2 oz (55.883 kg)  BMI 21.83 kg/m2  SpO2 99%  Wt Readings from Last 3 Encounters:  03/13/15 123 lb 3.2 oz (55.883 kg)  02/22/15 120 lb 11.2 oz (54.749 kg)  02/15/15 123  lb 11.2 oz (56.11 kg)    PHYSICAL EXAMINATION: height is '5\' 3"'$  (1.6 m) and weight is 123 lb 3.2 oz (55.883 kg). Her oral temperature is 98 F (36.7 C). Her blood pressure is 153/83 and her pulse is 93. Her respiration is 20 and oxygen saturation is 99%.        ASSESSMENT: The patient is doing satisfactorily with treatment.  PLAN: We will continue with the patient's radiation treatment as planned.

## 2015-03-16 ENCOUNTER — Ambulatory Visit
Admission: RE | Admit: 2015-03-16 | Discharge: 2015-03-16 | Disposition: A | Discharge status: Home / Self Care | Payer: Medicare Other | Source: Ambulatory Visit | Attending: Radiation Oncology | Admitting: Radiation Oncology

## 2015-03-16 DIAGNOSIS — Z510 Encounter for antineoplastic radiation therapy: Secondary | ICD-10-CM | POA: Diagnosis not present

## 2015-03-20 ENCOUNTER — Encounter: Payer: Self-pay | Admitting: Radiation Oncology

## 2015-03-20 ENCOUNTER — Ambulatory Visit
Admission: RE | Admit: 2015-03-20 | Discharge: 2015-03-20 | Disposition: A | Discharge status: Home / Self Care | Payer: Medicare Other | Source: Ambulatory Visit | Attending: Radiation Oncology | Admitting: Radiation Oncology

## 2015-03-20 DIAGNOSIS — Z510 Encounter for antineoplastic radiation therapy: Secondary | ICD-10-CM | POA: Diagnosis not present

## 2015-03-29 DIAGNOSIS — C3412 Malignant neoplasm of upper lobe, left bronchus or lung: Secondary | ICD-10-CM | POA: Insufficient documentation

## 2015-03-29 NOTE — Progress Notes (Signed)
[facility]iation [facility] Simulation and Treatment Planning Note   MRN: [id number]   Date: 03/29/2015  DOB: Feb 05, 1935  Status:outpatient    DIAGNOSIS: Non-small cell lung cancer   CONSENT VERIFIED:yes   SET UP: Patient is setup supine   IMMOBILIZATION: The patient was immobilized using a Vac Loc bag and Abdominal Compression.   NARRATIVE:The patient was brought to the [facility].  Identity was confirmed.  All relevant records and images related to the planned course of therapy were reviewed.  Then, the patient was positioned in a stable reproducible clinical set-up for radiation therapy. Abdominal compression was applied by me.  4D CT images were obtained and reproducible breathing pattern was confirmed. Free breathing CT images were obtained.  Skin markings were placed.  The CT images were loaded into the planning software where the target and avoidance structures were contoured.  The radiation prescription was entered and confirmed.    TREATMENT PLANNING NOTE:  Treatment planning then occurred. I have requested : MLC's, isodose plan, basic dose calculation.  3 dimensional simulation is performed and dose volume histogram of the gross tumor volume, planning tumor volume and criticial normal structures including the spinal cord and lungs were analyzed and requested.  Special treatment procedure was performed due to high dose per fraction.  The patient will be monitored for increased risk of toxicity.  Daily imaging using cone beam CT will be used for target localization.  Plan:  The patient will receive 54 gray in 3 fractions at 18 gray per fraction.

## 2015-03-29 NOTE — Progress Notes (Addendum)
  Radiation Oncology         [phone]) [phone] ________________________________  Name: Leslie Petersen MRN: [id number]  Date: 03/20/2015  DOB: 02-May-1935  End of Treatment Note  Diagnosis:   Non-small cell lung cancer     Indication for treatment:  Curative       Radiation treatment dates:   03/13/2015 through 03/20/2015  Site/dose:   The tumor in the right upper lobe was treated with a course of stereotactic body radiation treatment. The patient received 54 Gy In 3 fractions at 18 G per fraction.  Narrative: The patient tolerated radiation treatment relatively well.   The patient did not have any signs of acute toxicity during treatment.  Plan: The patient has completed radiation treatment. The patient will return to radiation oncology clinic for routine followup in one month. I advised the patient to call or return sooner if they have any questions or concerns related to their recovery or treatment.   ------------------------------------------------  Jodelle Gross, MD, PhD

## 2015-05-01 ENCOUNTER — Encounter: Payer: Self-pay | Admitting: Radiation Oncology

## 2015-05-03 ENCOUNTER — Telehealth: Payer: Self-pay | Admitting: Oncology

## 2015-05-03 NOTE — Telephone Encounter (Signed)
Due to PAL moved 12/20 appointments to 06/20/15. Spoke with patient she is aware.

## 2015-05-04 ENCOUNTER — Encounter: Payer: Self-pay | Admitting: Radiation Oncology

## 2015-05-04 ENCOUNTER — Ambulatory Visit
Admission: RE | Admit: 2015-05-04 | Discharge: 2015-05-04 | Disposition: A | Discharge status: Home / Self Care | Payer: Medicare Other | Source: Ambulatory Visit | Attending: Radiation Oncology | Admitting: Radiation Oncology

## 2015-05-04 VITALS — BP 140/85 | HR 97 | Temp 98.6°F | Resp 20 | Ht 63.0 in | Wt 126.8 lb

## 2015-05-04 DIAGNOSIS — C3412 Malignant neoplasm of upper lobe, left bronchus or lung: Secondary | ICD-10-CM

## 2015-05-04 HISTORY — DX: Personal history of irradiation: Z92.3

## 2015-05-04 NOTE — Progress Notes (Addendum)
Radiation Oncology         [phone] ________________________________  Name: Leslie Petersen MRN: [id number]  Date: 05/04/2015  DOB: [birth year]  Follow-Up Visit Note  CC: Tawanna Solo, MD  Ladell Pier, MD  Diagnosis:   Non-small cell lung cancer  Interval Since Last Radiation:  The patient completed stereotactic body radiation treatment on 03/20/2015   Narrative:  The patient returns today for routine follow-up.  The patient states that she has done very well since completing her course of radiation treatment.  Follow up s/p rad tx right upper lobe kung, 03/13/15-03/20/15, no coughing,  No nausea, appetite good, no pain, walks steady with a cane, energy level fine stated 5:01 PM BP 140/85 mmHg  Pulse 97  Temp(Src) 98.6 F (37 C) (Oral)  Resp 20  Ht '5\' 3"'$  (1.6 m)  Wt 126 lb 12.8 oz (57.516 kg)  BMI 22.47 kg/m2  SpO2 97%  Wt Readings from Last 3 Encounters:  05/04/15 126 lb 12.8 oz (57.516 kg)  03/13/15 123 lb 3.2 oz (55.883 kg)  02/22/15 120 lb 11.2 oz (54.749 kg)                                ALLERGIES:  is allergic to mirabegron and urabeth.  Meds: Current Outpatient Prescriptions  Medication Sig Dispense Refill  . aspirin EC 81 MG tablet Take 1 tablet (81 mg total) by mouth at bedtime. Stop and restart on September 30, 2014    . Calcium Carb-Cholecalciferol (CALCIUM 600 + D PO) Take 600 mg by mouth daily after lunch.    . Cholecalciferol (VITAMIN D3) 1000 UNITS CAPS Take 1,000 capsules by mouth daily.    Marland Kitchen docusate sodium (COLACE) 100 MG capsule Take 100 mg by mouth daily as needed for mild constipation.     . fexofenadine (ALLEGRA) 180 MG tablet Take 180 mg by mouth daily.    . fludrocortisone (FLORINEF) 0.1 MG tablet Take 0.1 mg by mouth daily after lunch.     . hydrocortisone (CORTEF) 10 MG tablet Take 15 mg by mouth daily at 6 (six) AM. Takes an additional 5 mg in the afternoon    . Multiple Vitamins-Minerals (MULTIVITAMIN WITH MINERALS) tablet Take 1 tablet  by mouth daily after lunch.     . Omega-3 Fatty Acids (FISH OIL) 1200 MG CAPS Take 1,200 mg by mouth daily after lunch.    . Travoprost, BAK Free, (TRAVATAN) 0.004 % SOLN ophthalmic solution Place 1 drop into both eyes at bedtime.    Marland Kitchen ibuprofen (ADVIL,MOTRIN) 200 MG tablet Take 400 mg by mouth daily as needed (pain).    . Methen-Hyosc-Meth Blue-Na Phos (UROLET MB) 81.6 MG TABS Take 81.6 mg by mouth 2 (two) times daily.  3  . pantoprazole (PROTONIX) 20 MG tablet Take 1 tablet (20 mg total) by mouth daily. (Patient not taking: Reported on 05/04/2015) 30 tablet 11  . polycarbophil (FIBERCON) 625 MG tablet Take 1,250 mg by mouth daily as needed for mild constipation.     . Vitamins-Lipotropics (LIPO-FLAVONOID PLUS PO) Take 1 tablet by mouth daily as needed (ringing in ears).      No current facility-administered medications for this encounter.    Physical Findings: The patient is in no acute distress. Patient is alert and oriented.  height is '5\' 3"'$  (1.6 m) and weight is 126 lb 12.8 oz (57.516 kg). Her oral temperature is 98.6 F (37 C). Her blood pressure  is 140/85 and her pulse is 97. Her respiration is 20 and oxygen saturation is 97%. .     Lab Findings: Lab Results  Component Value Date   WBC 6.8 02/03/2015   HGB 13.6 02/03/2015   HCT 40.2 02/03/2015   MCV 97.6 02/03/2015   PLT 238 02/03/2015     Radiographic Findings: No results found.  Impression:    The patient has no significant clinical complaints today related to her radiation treatment.  Plan:  Follow-up in 4 months. I discussed possibly scheduling a repeat CT scan of the chest in 2 months but the patient wanted to hold off on this until her next appointment with Dr. Benay Spice.   Jodelle Gross, M.D., Ph.D.

## 2015-05-04 NOTE — Progress Notes (Signed)
Follow up s/p rad tx left upper lobe kung, 03/13/15-03/20/15, no coughing,  No nausea, appetite good, no pain, walks steady with a cane, energy level fine stated 4:42 PM BP 140/85 mmHg  Pulse 97  Temp(Src) 98.6 F (37 C) (Oral)  Resp 20  Ht '5\' 3"'$  (1.6 m)  Wt 126 lb 12.8 oz (57.516 kg)  BMI 22.47 kg/m2  SpO2 97%  Wt Readings from Last 3 Encounters:  05/04/15 126 lb 12.8 oz (57.516 kg)  03/13/15 123 lb 3.2 oz (55.883 kg)  02/22/15 120 lb 11.2 oz (54.749 kg)

## 2015-06-06 ENCOUNTER — Ambulatory Visit: Payer: Medicare Other | Admitting: Oncology

## 2015-06-20 ENCOUNTER — Telehealth: Payer: Self-pay | Admitting: Oncology

## 2015-06-20 ENCOUNTER — Ambulatory Visit (HOSPITAL_BASED_OUTPATIENT_CLINIC_OR_DEPARTMENT_OTHER): Payer: Medicare Other | Admitting: Oncology

## 2015-06-20 VITALS — BP 141/76 | HR 97 | Temp 98.4°F | Resp 17 | Ht 63.0 in | Wt 126.3 lb

## 2015-06-20 DIAGNOSIS — Z85118 Personal history of other malignant neoplasm of bronchus and lung: Secondary | ICD-10-CM | POA: Diagnosis not present

## 2015-06-20 DIAGNOSIS — Z85858 Personal history of malignant neoplasm of other endocrine glands: Secondary | ICD-10-CM | POA: Diagnosis not present

## 2015-06-20 DIAGNOSIS — R918 Other nonspecific abnormal finding of lung field: Secondary | ICD-10-CM

## 2015-06-20 DIAGNOSIS — C3412 Malignant neoplasm of upper lobe, left bronchus or lung: Secondary | ICD-10-CM

## 2015-06-20 NOTE — Progress Notes (Signed)
  [facility] OFFICE PROGRESS NOTE   Diagnosis:  Non-small cell lung cancer  INTERVAL HISTORY:    Leslie Petersen returns as scheduled. She completed SRS treatment to the right lung mass in 3 fractions between 03/13/2015 and 03/20/2015. She reports tolerating the treatment well. No cough or fever. No complaint.  Objective:  Vital signs in last 24 hours:  Blood pressure 141/76, pulse 97, temperature 98.4 F (36.9 C), temperature source Oral, resp. rate 17, height '5\' 3"'$  (1.6 m), weight 126 lb 4.8 oz (57.289 kg), SpO2 97 %.    HEENT:  Neck without mass Lymphatics:  No cervical, supraclavicular, or axillary nodes Resp:  Decreased breath sounds at the right upper chest, no respiratory distress Cardio:  Regular rate and rhythm GI:  No hepatosplenomegaly Vascular:  No leg edema   Lab Results:  Lab Results  Component Value Date   WBC 6.8 02/03/2015   HGB 13.6 02/03/2015   HCT 40.2 02/03/2015   MCV 97.6 02/03/2015   PLT 238 02/03/2015   NEUTROABS 2.6 01/25/2015     Medications: I have reviewed the patient's current medications.  Assessment/Plan: 1. 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 2. Left upper lung adenocarcinoma (T1 N0), status post a wedge resection, 08/15/2009. 3. Left adrenocortical carcinoma, status post a left adrenalectomy in 1994. 4. CT of the chest and abdomen, 09/04/2012. No evidence of tumor recurrence or metastatic disease in the chest or abdomen. 5. Soft tissue nodule noted on the CT 09/04/2012, posterior to the left sacrum-present on a PET/CT 07/06/2009 without hypermetabolic activity. Larger on the pelvic CT 09/04/2013, the lesion had associated enhancement on an MRI 09/16/2013  Biopsy of the left paraspinous mass 10/01/2013 confirmed metastatic carcinoma consistent with metastatic adrenocortical carcinoma  Excision of the left paraspinous mass at [facility] on 12/03/2013 with the pathology  confirming metastatic adrenocortical carcinoma with negative surgical margins  Restaging CTs 09/28/2014 with an enlarging right upper lobe nodule and no other evidence of metastatic disease  PET scan 10/11/2014 confirmed a hypermetabolic right upper lobe nodule and a hypermetabolic AP window lymph node  EBUS biopsy of a 4L node on 10/18/2014 revealed rare atypical cells-nondiagnostic  CT 01/25/2015 confirmed enlargement of the right upper lung nodule and slight enlargement of mediastinal lymph nodes  EBUS biopsy of the right lung mass and a level 5L node on 02/06/2015 were nondiagnostic   SBRT treatment to the right upper lung mass in 3 fractions completed 03/20/2015 6. Enterococcus urinary tract infection 09/04/2013 7. Esophagitis,? Secondary to Fosamax, improved with Protonix 8. CT of the chest 06/12/2014 with an indeterminate right lung nodule  Progressive enlargement of a sub-solid right upper lobe nodule noted on the CT 09/28/2014 9. Duodenal polyp, status post a biopsy confirming a tubular adenoma on an endoscopy 06/22/2014  Removal of a duodenal polyp 09/16/2014-pathology revealed a duodenal adenoma, no high-grade dysplasia or malignancy identified   Disposition:   Leslie Petersen appears stable. She tolerated the radiation well. She will be scheduled for a restaging chest CT and office visit in 2 months.  Betsy Coder, MD  06/20/2015  1:29 PM

## 2015-06-20 NOTE — Telephone Encounter (Signed)
Gave patient avs report and appointments for March - central will call re ct - patient aware.

## 2015-07-02 NOTE — Addendum Note (Signed)
Encounter addended by: Kyung Rudd, MD on: 07/02/2015  5:28 PM<BR>     Documentation filed: Notes Section

## 2015-07-26 NOTE — Addendum Note (Signed)
Addended by: Jomarie Longs on: 07/26/2015 10:01 AM   Modules accepted: Orders

## 2015-08-15 ENCOUNTER — Telehealth: Payer: Self-pay

## 2015-08-15 NOTE — Telephone Encounter (Signed)
Patient's daughter calling regarding  fasting prior to the CT chest with contrast.  Writer informed her that patient would need to fast 4 hours prior to the scan.  Patient's daughter stated understanding and will instruct patient.

## 2015-08-17 ENCOUNTER — Ambulatory Visit (HOSPITAL_COMMUNITY)
Admission: RE | Admit: 2015-08-17 | Discharge: 2015-08-17 | Disposition: A | Discharge status: Home / Self Care | Payer: Medicare Other | Source: Ambulatory Visit | Attending: Oncology | Admitting: Oncology

## 2015-08-17 ENCOUNTER — Other Ambulatory Visit (HOSPITAL_BASED_OUTPATIENT_CLINIC_OR_DEPARTMENT_OTHER): Payer: Medicare Other

## 2015-08-17 DIAGNOSIS — I2510 Atherosclerotic heart disease of native coronary artery without angina pectoris: Secondary | ICD-10-CM | POA: Insufficient documentation

## 2015-08-17 DIAGNOSIS — C3412 Malignant neoplasm of upper lobe, left bronchus or lung: Secondary | ICD-10-CM | POA: Diagnosis not present

## 2015-08-17 DIAGNOSIS — J439 Emphysema, unspecified: Secondary | ICD-10-CM | POA: Diagnosis not present

## 2015-08-17 DIAGNOSIS — K449 Diaphragmatic hernia without obstruction or gangrene: Secondary | ICD-10-CM | POA: Insufficient documentation

## 2015-08-17 DIAGNOSIS — R911 Solitary pulmonary nodule: Secondary | ICD-10-CM | POA: Insufficient documentation

## 2015-08-17 DIAGNOSIS — Z923 Personal history of irradiation: Secondary | ICD-10-CM | POA: Diagnosis not present

## 2015-08-17 LAB — BASIC METABOLIC PANEL
ANION GAP: 9 meq/L (ref 3–11)
BUN: 21.9 mg/dL (ref 7.0–26.0)
CALCIUM: 9.4 mg/dL (ref 8.4–10.4)
CO2: 26 mEq/L (ref 22–29)
CREATININE: 0.7 mg/dL (ref 0.6–1.1)
Chloride: 108 mEq/L (ref 98–109)
EGFR: 77 mL/min/{1.73_m2} — ABNORMAL LOW (ref >90)
Glucose: 87 mg/dl (ref 70–140)
Potassium: 3.9 mEq/L (ref 3.5–5.1)
SODIUM: 143 meq/L (ref 136–145)

## 2015-08-17 MED ORDER — IOHEXOL 300 MG/ML  SOLN
75.0000 mL | Freq: Once | INTRAMUSCULAR | Status: AC | PRN
  Administered 2015-08-17: 75 mL via INTRAVENOUS

## 2015-08-21 ENCOUNTER — Telehealth: Payer: Self-pay | Admitting: *Deleted

## 2015-08-21 NOTE — Telephone Encounter (Signed)
-----   Message from Ladell Pier, MD sent at 08/20/2015  6:59 PM EST ----- Please call patient, CT is negative for cancer

## 2015-08-21 NOTE — Telephone Encounter (Signed)
Pt returned call, informed her CT is negative for cancer- per Dr. Benay Spice. She voiced understanding, confirmed next appt.

## 2015-08-24 ENCOUNTER — Telehealth: Payer: Self-pay | Admitting: Oncology

## 2015-08-24 ENCOUNTER — Ambulatory Visit (HOSPITAL_BASED_OUTPATIENT_CLINIC_OR_DEPARTMENT_OTHER): Payer: Medicare Other | Admitting: Oncology

## 2015-08-24 VITALS — BP 156/77 | HR 107 | Temp 97.8°F | Resp 18 | Ht 63.0 in | Wt 125.8 lb

## 2015-08-24 DIAGNOSIS — Z85118 Personal history of other malignant neoplasm of bronchus and lung: Secondary | ICD-10-CM

## 2015-08-24 DIAGNOSIS — C3490 Malignant neoplasm of unspecified part of unspecified bronchus or lung: Secondary | ICD-10-CM

## 2015-08-24 DIAGNOSIS — G8929 Other chronic pain: Secondary | ICD-10-CM | POA: Diagnosis not present

## 2015-08-24 DIAGNOSIS — R918 Other nonspecific abnormal finding of lung field: Secondary | ICD-10-CM | POA: Diagnosis not present

## 2015-08-24 DIAGNOSIS — Z85858 Personal history of malignant neoplasm of other endocrine glands: Secondary | ICD-10-CM | POA: Diagnosis not present

## 2015-08-24 DIAGNOSIS — M545 Low back pain: Secondary | ICD-10-CM | POA: Diagnosis not present

## 2015-08-24 NOTE — Telephone Encounter (Signed)
Gave and pritned appt sched and avs for pt for SEpt

## 2015-08-24 NOTE — Progress Notes (Signed)
  [place] [facility] OFFICE PROGRESS NOTE   Diagnosis:  Adrenocortical carcinoma, lung cancer  INTERVAL HISTORY:    Leslie Petersen returns as scheduled. She feels well. Chronic back pain. She reports "burning" in the legs. She relates this to steroid hormone replacement. She reports the steroid dose was recently decreased by Dr. Buddy Duty.  Objective:  Vital signs in last 24 hours:  Blood pressure 156/77, pulse 107, temperature 97.8 F (36.6 C), temperature source Oral, resp. rate 18, height '5\' 3"'$  (1.6 m), weight 125 lb 12.8 oz (57.063 kg), SpO2 100 %.    HEENT:  Neck without mass Lymphatics:  No cervical, supra-clavicular, axillary, or inguinal nodes Resp:  Lungs clear bilaterally Cardio:  Regular rate and rhythm GI:  No hepatosplenomegaly, no mass Vascular:  No leg edema  Skin: no evidence of recurrent tumor at the left lower back scar    Imaging: CT chest 08/17/2015- evolving postradiation change surrounding the right upper lobe nodule. No measurable residual nodule. No evidence of metastatic disease in the chest.  I reviewed the CT images with Leslie Petersen and her daughter  Medications: I have reviewed the patient's current medications.  Assessment/Plan: 1. 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 2. Left upper lung adenocarcinoma (T1 N0), status post a wedge resection, 08/15/2009. 3. Left adrenocortical carcinoma, status post a left adrenalectomy in 1994. 4. CT of the chest and abdomen, 09/04/2012. No evidence of tumor recurrence or metastatic disease in the chest or abdomen. 5. Soft tissue nodule noted on the CT 09/04/2012, posterior to the left sacrum-present on a PET/CT 07/06/2009 without hypermetabolic activity. Larger on the pelvic CT 09/04/2013, the lesion had associated enhancement on an MRI 09/16/2013  Biopsy of the left paraspinous mass 10/01/2013 confirmed metastatic carcinoma consistent with metastatic adrenocortical  carcinoma  Excision of the left paraspinous mass at [facility] on 12/03/2013 with the pathology confirming metastatic adrenocortical carcinoma with negative surgical margins  Restaging CTs 09/28/2014 with an enlarging right upper lobe nodule and no other evidence of metastatic disease  PET scan 10/11/2014 confirmed a hypermetabolic right upper lobe nodule and a hypermetabolic AP window lymph node  EBUS biopsy of a 4L node on 10/18/2014 revealed rare atypical cells-nondiagnostic  CT 01/25/2015 confirmed enlargement of the right upper lung nodule and slight enlargement of mediastinal lymph nodes  EBUS biopsy of the right lung mass and a level 5L node on 02/06/2015 were nondiagnostic  SBRT treatment to the right upper lung mass in 3 fractions completed 03/20/2015   CT chest 08/17/2015- postradiation changes surrounding the previous right upper lobe nodule, no evidence of metastatic disease in chest 6. Enterococcus urinary tract infection 09/04/2013 7. Esophagitis,? Secondary to Fosamax, improved with Protonix 8. CT of the chest 06/12/2014 with an indeterminate right lung nodule  Progressive enlargement of a sub-solid right upper lobe nodule noted on the CT 09/28/2014 9. Duodenal polyp, status post a biopsy confirming a tubular adenoma on an endoscopy 06/22/2014  Removal of a duodenal polyp 09/16/2014-pathology revealed a duodenal adenoma, no high-grade dysplasia or malignancy identified   Disposition:   she appears stable. No clinical evidence for progression of lung cancer or adrenocortical carcinoma. She will return for an office visit and repeat CT of the chest in 6 months. Leslie Petersen  will contact us in the interim as needed.  Betsy Coder, MD  08/24/2015  3:41 PM

## 2015-11-16 ENCOUNTER — Encounter (HOSPITAL_COMMUNITY): Payer: Self-pay

## 2015-11-16 ENCOUNTER — Ambulatory Visit (HOSPITAL_COMMUNITY)
Admission: RE | Admit: 2015-11-16 | Discharge: 2015-11-16 | Disposition: A | Discharge status: Home / Self Care | Payer: Medicare Other | Source: Ambulatory Visit | Attending: Internal Medicine | Admitting: Internal Medicine

## 2015-11-16 ENCOUNTER — Encounter: Payer: Self-pay | Admitting: Internal Medicine

## 2015-11-16 DIAGNOSIS — M810 Age-related osteoporosis without current pathological fracture: Secondary | ICD-10-CM | POA: Insufficient documentation

## 2015-11-16 MED ORDER — ZOLEDRONIC ACID 5 MG/100ML IV SOLN
5.0000 mg | Freq: Once | INTRAVENOUS | Status: AC
  Administered 2015-11-16: 5 mg via INTRAVENOUS
  Filled 2015-11-16: qty 100

## 2015-11-16 MED ORDER — SODIUM CHLORIDE 0.9 % IV SOLN
Freq: Once | INTRAVENOUS | Status: AC
  Administered 2015-11-16: 15:00:00 via INTRAVENOUS

## 2015-11-16 NOTE — Discharge Instructions (Signed)
Zoledronic Acid injection (Paget's Disease, Osteoporosis)  RECLAST What is this medicine? ZOLEDRONIC ACID (ZOE le dron ik AS id) lowers the amount of calcium loss from bone. It is used to treat Paget's disease and osteoporosis in women. This medicine may be used for other purposes; ask your health care provider or pharmacist if you have questions. What should I tell my health care provider before I take this medicine? They need to know if you have any of these conditions: -aspirin-sensitive asthma -cancer, especially if you are receiving medicines used to treat cancer -dental disease or wear dentures -infection -kidney disease -low levels of calcium in the blood -past surgery on the parathyroid gland or intestines -receiving corticosteroids like dexamethasone or prednisone -an unusual or allergic reaction to zoledronic acid, other medicines, foods, dyes, or preservatives -pregnant or trying to get pregnant -breast-feeding How should I use this medicine? This medicine is for infusion into a vein. It is given by a health care professional in a hospital or clinic setting. Talk to your pediatrician regarding the use of this medicine in children. This medicine is not approved for use in children. Overdosage: If you think you have taken too much of this medicine contact a poison control center or emergency room at once. NOTE: This medicine is only for you. Do not share this medicine with others. What if I miss a dose? It is important not to miss your dose. Call your doctor or health care professional if you are unable to keep an appointment. What may interact with this medicine? -certain antibiotics given by injection -NSAIDs, medicines for pain and inflammation, like ibuprofen or naproxen -some diuretics like bumetanide, furosemide -teriparatide This list may not describe all possible interactions. Give your health care provider a list of all the medicines, herbs, non-prescription drugs, or  dietary supplements you use. Also tell them if you smoke, drink alcohol, or use illegal drugs. Some items may interact with your medicine. What should I watch for while using this medicine? Visit your doctor or health care professional for regular checkups. It may be some time before you see the benefit from this medicine. Do not stop taking your medicine unless your doctor tells you to. Your doctor may order blood tests or other tests to see how you are doing. Women should inform their doctor if they wish to become pregnant or think they might be pregnant. There is a potential for serious side effects to an unborn child. Talk to your health care professional or pharmacist for more information. You should make sure that you get enough calcium and vitamin D while you are taking this medicine. Discuss the foods you eat and the vitamins you take with your health care professional. Some people who take this medicine have severe bone, joint, and/or muscle pain. This medicine may also increase your risk for jaw problems or a broken thigh bone. Tell your doctor right away if you have severe pain in your jaw, bones, joints, or muscles. Tell your doctor if you have any pain that does not go away or that gets worse. Tell your dentist and dental surgeon that you are taking this medicine. You should not have major dental surgery while on this medicine. See your dentist to have a dental exam and fix any dental problems before starting this medicine. Take good care of your teeth while on this medicine. Make sure you see your dentist for regular follow-up appointments. What side effects may I notice from receiving this medicine? Side effects that  you should report to your doctor or health care professional as soon as possible: -allergic reactions like skin rash, itching or hives, swelling of the face, lips, or tongue -anxiety, confusion, or depression -breathing problems -changes in vision -eye pain -feeling faint or  lightheaded, falls -jaw pain, especially after dental work -mouth sores -muscle cramps, stiffness, or weakness -redness, blistering, peeling or loosening of the skin, including inside the mouth -trouble passing urine or change in the amount of urine Side effects that usually do not require medical attention (report to your doctor or health care professional if they continue or are bothersome): -bone, joint, or muscle pain -constipation -diarrhea -fever -hair loss -irritation at site where injected -loss of appetite -nausea, vomiting -stomach upset -trouble sleeping -trouble swallowing -weak or tired This list may not describe all possible side effects. Call your doctor for medical advice about side effects. You may report side effects to FDA at 1-800-FDA-1088. Where should I keep my medicine? This drug is given in a hospital or clinic and will not be stored at home. NOTE: This sheet is a summary. It may not cover all possible information. If you have questions about this medicine, talk to your doctor, pharmacist, or health care provider.    2016, Elsevier/Gold Standard. (2013-10-30 14:19:57)

## 2015-11-16 NOTE — Progress Notes (Addendum)
reclast given, pt tolerated well.  D/c instruction on reclast given and explained to pt.  Pt takes daily vitamin D and calcium.  First time receiving Reclast so pt stayed 30 min.post infusion for observation.  Pt was d/c with family member to lobby via wheelchair.

## 2016-02-01 ENCOUNTER — Ambulatory Visit: Payer: Medicare Other | Admitting: Internal Medicine

## 2016-02-09 ENCOUNTER — Encounter: Payer: Self-pay | Admitting: Internal Medicine

## 2016-02-09 ENCOUNTER — Ambulatory Visit (INDEPENDENT_AMBULATORY_CARE_PROVIDER_SITE_OTHER): Payer: Medicare Other | Admitting: Internal Medicine

## 2016-02-09 VITALS — BP 142/80 | HR 88 | Ht 64.0 in | Wt 128.0 lb

## 2016-02-09 DIAGNOSIS — Z86010 Personal history of colonic polyps: Secondary | ICD-10-CM | POA: Diagnosis not present

## 2016-02-09 DIAGNOSIS — K648 Other hemorrhoids: Secondary | ICD-10-CM | POA: Diagnosis not present

## 2016-02-09 DIAGNOSIS — D132 Benign neoplasm of duodenum: Secondary | ICD-10-CM | POA: Diagnosis not present

## 2016-02-09 NOTE — Patient Instructions (Signed)
   We will think about doing a colonoscopy and/or upper endoscopy in may of 2018. When you are ready make an appointment to see me about the hemorrhoid symptoms.  Hope you get good news from Dr. Benay Spice.  I appreciate the opportunity to care for you. [name], MD, [facility]
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
## 2016-02-12 ENCOUNTER — Encounter: Payer: Self-pay | Admitting: Internal Medicine

## 2016-02-20 ENCOUNTER — Ambulatory Visit (HOSPITAL_COMMUNITY)
Admission: RE | Admit: 2016-02-20 | Discharge: 2016-02-20 | Disposition: A | Discharge status: Home / Self Care | Payer: Medicare Other | Source: Ambulatory Visit | Attending: Oncology | Admitting: Oncology

## 2016-02-20 DIAGNOSIS — C3490 Malignant neoplasm of unspecified part of unspecified bronchus or lung: Secondary | ICD-10-CM | POA: Diagnosis present

## 2016-02-20 DIAGNOSIS — K449 Diaphragmatic hernia without obstruction or gangrene: Secondary | ICD-10-CM | POA: Insufficient documentation

## 2016-02-20 DIAGNOSIS — I2510 Atherosclerotic heart disease of native coronary artery without angina pectoris: Secondary | ICD-10-CM | POA: Diagnosis not present

## 2016-02-20 DIAGNOSIS — I700 Atherosclerosis of aorta: Secondary | ICD-10-CM | POA: Insufficient documentation

## 2016-02-20 DIAGNOSIS — J439 Emphysema, unspecified: Secondary | ICD-10-CM | POA: Diagnosis not present

## 2016-02-20 DIAGNOSIS — Z923 Personal history of irradiation: Secondary | ICD-10-CM | POA: Insufficient documentation

## 2016-02-27 ENCOUNTER — Telehealth: Payer: Self-pay | Admitting: *Deleted

## 2016-02-27 NOTE — Telephone Encounter (Signed)
Called pt with CT result: Negative for recurrent cancer, per Dr. Benay Spice. Pt voiced understanding. Next appt confirmed.

## 2016-02-27 NOTE — Telephone Encounter (Signed)
-----   Message from Ladell Pier, MD sent at 02/26/2016  6:01 PM EDT ----- Please call patient, CT negative for recurrent cancer

## 2016-02-29 ENCOUNTER — Ambulatory Visit (HOSPITAL_BASED_OUTPATIENT_CLINIC_OR_DEPARTMENT_OTHER): Payer: Medicare Other | Admitting: Oncology

## 2016-02-29 ENCOUNTER — Telehealth: Payer: Self-pay | Admitting: Oncology

## 2016-02-29 VITALS — BP 157/83 | HR 96 | Temp 98.2°F | Resp 18 | Ht 64.0 in | Wt 127.4 lb

## 2016-02-29 DIAGNOSIS — Z85118 Personal history of other malignant neoplasm of bronchus and lung: Secondary | ICD-10-CM

## 2016-02-29 DIAGNOSIS — G8929 Other chronic pain: Secondary | ICD-10-CM | POA: Diagnosis not present

## 2016-02-29 DIAGNOSIS — R918 Other nonspecific abnormal finding of lung field: Secondary | ICD-10-CM | POA: Diagnosis not present

## 2016-02-29 DIAGNOSIS — Z85858 Personal history of malignant neoplasm of other endocrine glands: Secondary | ICD-10-CM | POA: Diagnosis not present

## 2016-02-29 DIAGNOSIS — M545 Low back pain: Secondary | ICD-10-CM | POA: Diagnosis not present

## 2016-02-29 DIAGNOSIS — C3491 Malignant neoplasm of unspecified part of right bronchus or lung: Secondary | ICD-10-CM

## 2016-02-29 NOTE — Telephone Encounter (Signed)
Avs report and schedule given to patient, per 02/29/16 los.

## 2016-02-29 NOTE — Progress Notes (Signed)
  [place] OFFICE PROGRESS NOTE   Diagnosis: Non-small cell lung cancer, adrenal carcinoma  INTERVAL HISTORY:   Leslie Petersen returns as scheduled. She feels well. Good appetite. No new pain. She has chronic back pain. No other complaint.  Objective:  Vital signs in last 24 hours:  Blood pressure (!) 157/83, pulse 96, temperature 98.2 F (36.8 C), temperature source Oral, resp. rate 18, height '5\' 4"'$  (1.626 m), weight 127 lb 6.4 oz (57.8 kg), SpO2 97 %.    HEENT: Neck without mass Lymphatics: No cervical, supraclavicular, axillary, or inguinal nodes Resp: Clear bilaterally, decreased breath sounds at the right lower chest, no respiratory distress Cardio: Regular rate and rhythm GI: No hepatosplenomegaly, nontender, no mass Vascular: No leg edema Musculoskeletal: Left lower back surgical site without evidence of recurrent tumor    Medications: I have reviewed the patient's current medications.  Assessment/Plan: 1. 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 2. Left upper lung adenocarcinoma (T1 N0), status post a wedge resection, 08/15/2009. 3. Left adrenocortical carcinoma, status post a left adrenalectomy in 1994. 4. CT of the chest and abdomen, 09/04/2012. No evidence of tumor recurrence or metastatic disease in the chest or abdomen. 5. Soft tissue nodule noted on the CT 09/04/2012, posterior to the left sacrum-present on a PET/CT 07/06/2009 without hypermetabolic activity. Larger on the pelvic CT 09/04/2013, the lesion had associated enhancement on an MRI 09/16/2013  Biopsy of the left paraspinous mass 10/01/2013 confirmed metastatic carcinoma consistent with metastatic adrenocortical carcinoma  Excision of the left paraspinous mass at [facility] on 12/03/2013 with the pathology confirming metastatic adrenocortical carcinoma with negative surgical margins  Restaging CTs 09/28/2014 with an enlarging right upper lobe  nodule and no other evidence of metastatic disease  PET scan 10/11/2014 confirmed a hypermetabolic right upper lobe nodule and a hypermetabolic AP window lymph node  EBUS biopsy of a 4L node on 10/18/2014 revealed rare atypical cells-nondiagnostic  CT 01/25/2015 confirmed enlargement of the right upper lung nodule and slight enlargement of mediastinal lymph nodes  EBUS biopsy of the right lung mass and a level 5L node on 02/06/2015 were nondiagnostic  SBRT treatment to the right upper lung mass in 3 fractions completed 03/20/2015   CT chest 08/17/2015- postradiation changes surrounding the previous right upper lobe nodule, no evidence of metastatic disease in chest  CT chest 02/21/2016-evolution of postradiation changes in the right upper lobe, no evidence of recurrent disease 6. Enterococcus urinary tract infection 09/04/2013 7. Esophagitis,? Secondary to Fosamax, improved with Protonix 8. CT of the chest 06/12/2014 with an indeterminate right lung nodule  Progressive enlargement of a sub-solid right upper lobe nodule noted on the CT 09/28/2014 9. Duodenal polyp, status post a biopsy confirming a tubular adenoma on an endoscopy 06/22/2014  Removal of a duodenal polyp 09/16/2014-pathology revealed a duodenal adenoma, no high-grade dysplasia or malignancy identified    Disposition:  Ms. Ke remains in clinical remission from adrenal carcinoma and non-small cell lung cancer. She will return for an office visit and chest CT in 6 months.  Betsy Coder, MD  02/29/2016  10:25 AM

## 2016-03-14 ENCOUNTER — Encounter (HOSPITAL_COMMUNITY): Payer: Self-pay | Admitting: *Deleted

## 2016-03-14 ENCOUNTER — Emergency Department (HOSPITAL_COMMUNITY)
Admission: EM | Admit: 2016-03-14 | Discharge: 2016-03-14 | Disposition: A | Discharge status: Home / Self Care | Payer: Medicare Other | Attending: Dermatology | Admitting: Dermatology

## 2016-03-14 DIAGNOSIS — Z5321 Procedure and treatment not carried out due to patient leaving prior to being seen by health care provider: Secondary | ICD-10-CM | POA: Diagnosis not present

## 2016-03-14 DIAGNOSIS — Z7982 Long term (current) use of aspirin: Secondary | ICD-10-CM | POA: Insufficient documentation

## 2016-03-14 DIAGNOSIS — Z85118 Personal history of other malignant neoplasm of bronchus and lung: Secondary | ICD-10-CM | POA: Insufficient documentation

## 2016-03-14 DIAGNOSIS — R232 Flushing: Secondary | ICD-10-CM | POA: Insufficient documentation

## 2016-03-14 DIAGNOSIS — Z87891 Personal history of nicotine dependence: Secondary | ICD-10-CM | POA: Insufficient documentation

## 2016-03-14 NOTE — ED Triage Notes (Signed)
Patient c/o hot/cold flashes and exceptionally cold feet as well as a burning sensation in her legs for several months.  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  Patient has discussed this issue with Dr. Buddy Duty last week.  Dr. Buddy Duty tested patient's thyroid and urine with no abnormalities.  He changed the way she takes her steroids in hopes that would help.  Patient's PCP has also been consulted regarding this issue and prescribed Gabapentin, which patient has decided not to take.  Patient states that has not improved the situation.  Patient denies numbness and tingling in her legs.  No signs of venous stasis in lower extremities, skin warm and dry with no LE edema.  +2 pulses in bilateral feet.

## 2016-03-14 NOTE — ED Notes (Signed)
Pt stated " she did not want to wait anymore." give front desk clerk labels

## 2016-05-27 ENCOUNTER — Encounter: Payer: Self-pay | Admitting: Internal Medicine

## 2016-05-27 ENCOUNTER — Ambulatory Visit (INDEPENDENT_AMBULATORY_CARE_PROVIDER_SITE_OTHER): Payer: Medicare Other | Admitting: Internal Medicine

## 2016-05-27 VITALS — BP 130/84 | HR 100 | Ht 62.5 in | Wt 124.5 lb

## 2016-05-27 DIAGNOSIS — K5902 Outlet dysfunction constipation: Secondary | ICD-10-CM

## 2016-05-27 NOTE — Patient Instructions (Addendum)
   Try taking MiraLax every day 1-2 doses and use Dulcolax to move your bowels if you have not gone in 2 days.  I appreciate the opportunity to care for you. [name], MD, [facility]

## 2016-05-27 NOTE — Progress Notes (Signed)
   Leslie Petersen 81 y.o. Nov 01, 1934 [id number]  Assessment & Plan:   Encounter Diagnosis  Name Primary?  . Constipation due to outlet dysfunction Yes    Seems like she has disordered defecation based upon rectal exam. She is not interested in anorectal manometry or PT ? If some relationship to back/spinal pain  Will use Miralax 1-2/day and intermittent dulcolax  See me prn  Subjective:   Chief Complaint: rectal pressure  HPI Here w/ daughter "It feels like Ia am sitting on a rock" Has chronic constipation and difficulty expelling the stool - strains and nothing comes out. No bleeding. Achy pain, nothing sharp. Wonders if from hemorrhoids.  No abd pain 1 child birth  uncomplicated  Medications, allergies, past medical history, past surgical history, family history and social history are reviewed and updated in the EMR.  Review of Systems Hx multiple cancers + urinary frequency Back pain - has intermittent neuropathic pain of LE's  Objective:   Physical Exam BP 130/84 (BP Location: Left Arm, Patient Position: Sitting, Cuff Size: Normal)   Pulse 100   Ht 5' 2.5" (1.588 m)   Wt 124 lb 8 oz (56.5 kg)   BMI 22.41 kg/m  NAD Jackolyn Confer, CMA present  Rectal exam - NL anoderm Intact anal wink NL resting tone, non-tender and no mass Appropriate voluntary squeeze Simulated defecation shows inappropriate sphincter contraction and no sig descent

## 2016-08-22 ENCOUNTER — Encounter (HOSPITAL_COMMUNITY): Payer: Self-pay

## 2016-08-22 ENCOUNTER — Emergency Department (HOSPITAL_COMMUNITY)
Admission: EM | Admit: 2016-08-22 | Discharge: 2016-08-22 | Disposition: A | Discharge status: Home / Self Care | Payer: Medicare Other | Attending: Emergency Medicine | Admitting: Emergency Medicine

## 2016-08-22 DIAGNOSIS — M792 Neuralgia and neuritis, unspecified: Secondary | ICD-10-CM | POA: Insufficient documentation

## 2016-08-22 DIAGNOSIS — Z7982 Long term (current) use of aspirin: Secondary | ICD-10-CM | POA: Insufficient documentation

## 2016-08-22 DIAGNOSIS — Z87891 Personal history of nicotine dependence: Secondary | ICD-10-CM | POA: Insufficient documentation

## 2016-08-22 DIAGNOSIS — Z79899 Other long term (current) drug therapy: Secondary | ICD-10-CM | POA: Diagnosis not present

## 2016-08-22 DIAGNOSIS — Z85118 Personal history of other malignant neoplasm of bronchus and lung: Secondary | ICD-10-CM | POA: Diagnosis not present

## 2016-08-22 DIAGNOSIS — R200 Anesthesia of skin: Secondary | ICD-10-CM | POA: Diagnosis present

## 2016-08-22 LAB — CBC WITH DIFFERENTIAL/PLATELET
BASOS ABS: 0 10*3/uL (ref 0.0–0.1)
Basophils Relative: 1 %
EOS ABS: 0 10*3/uL (ref 0.0–0.7)
EOS PCT: 1 %
HCT: 40.1 % (ref 36.0–46.0)
HEMOGLOBIN: 13.6 g/dL (ref 12.0–15.0)
LYMPHS PCT: 24 %
Lymphs Abs: 1.6 10*3/uL (ref 0.7–4.0)
MCH: 34.3 pg — ABNORMAL HIGH (ref 26.0–34.0)
MCHC: 33.9 g/dL (ref 30.0–36.0)
MCV: 101.3 fL — AB (ref 78.0–100.0)
Monocytes Absolute: 0.3 10*3/uL (ref 0.1–1.0)
Monocytes Relative: 4 %
NEUTROS PCT: 70 %
Neutro Abs: 4.7 10*3/uL (ref 1.7–7.7)
PLATELETS: 268 10*3/uL (ref 150–400)
RBC: 3.96 MIL/uL (ref 3.87–5.11)
RDW: 13.6 % (ref 11.5–15.5)
WBC: 6.6 10*3/uL (ref 4.0–10.5)

## 2016-08-22 MED ORDER — GABAPENTIN 300 MG PO CAPS
ORAL_CAPSULE | ORAL | 0 refills | Status: AC
Start: 2016-08-22 — End: ?

## 2016-08-22 NOTE — ED Notes (Signed)
Tech at bedside collecting labs

## 2016-08-22 NOTE — Discharge Instructions (Signed)
Follow up with your family md in 2 weeks

## 2016-08-22 NOTE — ED Triage Notes (Signed)
Patient reports that she is "feeling like she is freezing and legs are burning up." patient has a history of adrenal cancer. Patient states, "I can't take this any longer."

## 2016-08-23 ENCOUNTER — Emergency Department (HOSPITAL_COMMUNITY)
Admission: RE | Admit: 2016-08-23 | Discharge: 2016-08-23 | Disposition: A | Discharge status: Home / Self Care | Payer: Medicare Other | Source: Ambulatory Visit | Attending: Oncology | Admitting: Oncology

## 2016-08-23 DIAGNOSIS — C3491 Malignant neoplasm of unspecified part of right bronchus or lung: Secondary | ICD-10-CM

## 2016-08-23 NOTE — ED Provider Notes (Signed)
[place] DEPT Provider Note   CSN: [id number] Arrival date & time: 08/22/16  0931     History   Chief Complaint Chief Complaint  Patient presents with  . feels cold    HPI Leslie Petersen is a 82 y.o. female.  Patient with burning in her legs. Sublingual and on for a long time.    Leg Pain   This is a chronic problem. The current episode started more than 1 week ago. The problem occurs constantly. The problem has not changed since onset.Pain location: Both lower legs. The quality of the pain is described as aching. The pain is at a severity of 2/10. The pain is mild. Associated symptoms include numbness.    Past Medical History:  Diagnosis Date  . Adenomatous colon polyp   . Adrenal cortical tumor 2011   right, resected  . Allergy    seasonal  . Arthritis    back  . Cataract   . Constipation   . Dry skin   . 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 Esophageal polyp   . GERD (gastroesophageal reflux disease)   . Glaucoma   . Hard of hearing   . History of hiatal hernia   . Lung cancer, upper lobe, left 2011   adenocarcinoma resected   . Osteoporosis   . S/P radiation therapy 9/26-16-03/20/15   SBRT LUL 54 Gy/1f  . Urinary frequency   . Wears glasses     Patient Active Problem List   Diagnosis Date Noted  . Primary cancer of left upper lobe of lung ([facility] 03/29/2015  . 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 Osteoporosis 02/17/2014  . Atypical chest pain 02/17/2014  . Hypotension 02/17/2014  . Ad[place]ocortical carcinoma ([facility] 02/17/2014  . [place] 02/17/2014  . Chest pain 02/17/2014  . Personal history adenomatous of colonic polyps 10/24/2011    Past Surgical History:  Procedure Laterality Date  . ADRENAL GLAND SURGERY  1990/2011  . CATARACT EXTRACTION, BILATERAL Bilateral   . COLONOSCOPY  multiple  . ESOPHAGOGASTRODUODENOSCOPY    . ESOPHAGOGASTRODUODENOSCOPY N/A 09/16/2014   Procedure: ESOPHAGOGASTRODUODENOSCOPY (EGD);  Surgeon: CGatha Mayer MD;   [facility];  Service: Endoscopy;  Laterality: N/A;  . EYE SURGERY    . HOT HEMOSTASIS N/A 09/16/2014   Procedure: HOT HEMOSTASIS (ARGON PLASMA COAGULATION/BICAP);  Surgeon: CGatha Mayer MD;  [facility];  Service: Endoscopy;  Laterality: N/A;  . LUNG CANCER SURGERY  08/2009  . [facility] from back  [facility]   [facility] per pt  . TUBAL LIGATION    . VIDEO BRONCHOSCOPY WITH ENDOBRONCHIAL NAVIGATION N/A 02/06/2015   Procedure: VIDEO BRONCHOSCOPY WITH ENDOBRONCHIAL NAVIGATION;  Surgeon: EGrace Isaac MD;  [facility]  Service: Thoracic;  Laterality: N/A;  . VIDEO BRONCHOSCOPY WITH ENDOBRONCHIAL ULTRASOUND N/A 10/18/2014   Procedure: VIDEO BRONCHOSCOPY WITH ENDOBRONCHIAL ULTRASOUND;  Surgeon: EGrace Isaac MD;  [facility]  Service: Thoracic;  Laterality: N/A;  . VIDEO BRONCHOSCOPY WITH ENDOBRONCHIAL ULTRASOUND N/A 02/06/2015   Procedure: VIDEO BRONCHOSCOPY WITH ENDOBRONCHIAL ULTRASOUND;  Surgeon: EGrace Isaac MD;  [facility]  Service: Thoracic;  Laterality: N/A;    OB History    No data available       Home Medications    Prior to Admission medications   Medication Sig Start Date End Date Taking? Authorizing Provider  aspirin EC 81 MG tablet Take 1 tablet (81 mg total) by mouth at bedtime. Stop and restart on  September 30, 2014 09/16/14  Yes [name], MD  Calcium Carb-Cholecalciferol (CALCIUM 600 + D PO) Take 600 mg by mouth daily after lunch.   Yes Historical Provider, MD  Cholecalciferol (VITAMIN D3) 1000 UNITS CAPS Take 1,000 capsules by mouth daily.   Yes Historical Provider, MD  ciprofloxacin (CIPRO) 250 MG tablet Take 250 mg by mouth daily. 08/20/16  Yes Historical Provider, MD  docusate sodium (COLACE) 100 MG capsule Take 100 mg by mouth daily as needed for mild constipation.    Yes Historical Provider, MD  fexofenadine (ALLEGRA) 180 MG tablet Take 180 mg by mouth daily.   Yes Historical  Provider, MD  fludrocortisone (FLORINEF) 0.1 MG tablet Take 0.1 mg by mouth daily. At 8:00 am 09/12/11  Yes Historical Provider, MD  hydrocortisone (CORTEF) 10 MG tablet 10 mg 2 (two) times daily. Take 10 mg tablet by mouth at 8:00 am 5 mg tablet at noon 09/12/11  Yes Historical Provider, MD  ibuprofen (ADVIL,MOTRIN) 200 MG tablet Take 400 mg by mouth daily as needed (pain).   Yes Historical Provider, MD  Multiple Vitamins-Minerals (MULTIVITAMIN WITH MINERALS) tablet Take 1 tablet by mouth daily after lunch.    Yes Historical Provider, MD  Omega-3 Fatty Acids (FISH OIL) 1200 MG CAPS Take 1,200 mg by mouth daily after lunch.   Yes Historical Provider, MD  polycarbophil (FIBERCON) 625 MG tablet Take 1,250 mg by mouth daily as needed for mild constipation.    Yes Historical Provider, MD  Travoprost, BAK Free, (TRAVATAN) 0.004 % SOLN ophthalmic solution Place 1 drop into both eyes at bedtime.   Yes Historical Provider, MD  gabapentin (NEURONTIN) 300 MG capsule Take one pill tid 08/22/16   Milton Ferguson, MD  Vitamins-Lipotropics (LIPO-FLAVONOID PLUS PO) Take 1 tablet by mouth daily as needed (ringing in ears).     Historical Provider, MD    Family History Family History  Problem Relation Age of Onset  . Heart attack Mother   . Heart attack Father     Social History Social History  Substance Use Topics  . Smoking status: Former Smoker    Quit date: 02/17/1989  . Smokeless tobacco: Never Used  . Alcohol use No     Allergies   Mirabegron and Urabeth [bethanechol]   Review of Systems Review of Systems  Constitutional: Negative for appetite change and fatigue.  HENT: Negative for congestion, ear discharge and sinus pressure.   Eyes: Negative for discharge.  Respiratory: Negative for cough.   Cardiovascular: Negative for chest pain.  Gastrointestinal: Negative for abdominal pain and diarrhea.  Genitourinary: Negative for frequency and hematuria.  Musculoskeletal: Negative for back pain.    Skin: Negative for rash.  Neurological: Positive for numbness. Negative for seizures and headaches.  Psychiatric/Behavioral: Negative for hallucinations.     Physical Exam Updated Vital Signs BP 170/75 (BP Location: Right Arm)   Pulse 84   Temp 98.4 F (36.9 C) (Oral)   Resp 18   Ht '5\' 4"'$  (1.626 m)   Wt 129 lb (58.5 kg)   SpO2 94%   BMI 22.14 kg/m   Physical Exam  Constitutional: She is oriented to person, place, and time. She appears well-developed.  HENT:  Head: Normocephalic.  Eyes: Conjunctivae and EOM are normal. No scleral icterus.  Neck: Neck supple. No thyromegaly present.  Cardiovascular: Normal rate and regular rhythm.  Exam reveals no gallop and no friction rub.   No murmur heard. Pulmonary/Chest: No stridor. She has no wheezes. She has no  rales. She exhibits no tenderness.  Abdominal: She exhibits no distension. There is no tenderness. There is no rebound.  Musculoskeletal: Normal range of motion. She exhibits no edema.  Lymphadenopathy:    She has no cervical adenopathy.  Neurological: She is oriented to person, place, and time. She exhibits normal muscle tone. Coordination normal.  Skin: No rash noted. No erythema.  Psychiatric: She has a normal mood and affect. Her behavior is normal.     ED Treatments / Results  Labs (all labs ordered are listed, but only abnormal results are displayed) Labs Reviewed  CBC WITH DIFFERENTIAL/PLATELET - Abnormal; Notable for the following:       Result Value   MCV 101.3 (*)    MCH 34.3 (*)    All other components within normal limits    EKG  EKG Interpretation None       Radiology Ct Chest Wo Contrast  Result Date: 08/23/2016 CLINICAL DATA:  Followup lung cancer. Adenocarcinoma of the right lung. EXAM: CT CHEST WITHOUT CONTRAST TECHNIQUE: Multidetector CT imaging of the chest was performed following the standard protocol without IV contrast. COMPARISON:  02/20/2016 FINDINGS: Cardiovascular: The heart size  appears normal. There is no pericardial effusion identified. Aortic atherosclerosis. No aneurysm. Calcification in the RCA, LAD and left circumflex coronary artery noted. Mediastinum/Nodes: Hiatal hernia. The trachea appears patent and is midline. Index left paratracheal lymph node measures 8 mm and is unchanged from previous exam, image 53 of series 2. Lungs/Pleura: Moderate changes of centrilobular emphysema. Index lesion within the right upper lobe with surrounding changes of external beam radiation again noted. Lesion measure 1.2 x 1.8 cm, image 48 of series 7. Previously 1.6 x 2.1 cm. Postsurgical changes within the left lung are stable from previous exam, image 50 of series 7 Upper Abdomen: No acute abnormality. Previous bilateral adrenalectomy has been performed. Stable left nodule in the left adrenalectomy bed measuring 1.7 cm, image 137 of series 2. Musculoskeletal: There are no aggressive lytic or sclerotic bone lesions identified. Stable lower thoracic vertebral body compression deformities. Scoliosis and degenerative disc disease identified. IMPRESSION: 1. Continued decrease in size of index lesion in the right upper lobe. 2. Aortic atherosclerosis and coronary artery calcification 3. Emphysema 4. Hiatal hernia. Electronically Signed   By: Kerby Moors M.D.   On: 08/23/2016 14:37    Procedures Procedures (including critical care time)  Medications Ordered in ED Medications - No data to display   Initial Impression / Assessment and Plan / ED Course  I have reviewed the triage vital signs and the nursing notes.  Pertinent labs & imaging results that were available during my care of the patient were reviewed by me and considered in my medical decision making (see chart for details).     Patient with peripheral neuritis. Will treat with Neurontin  Final Clinical Impressions(s) / ED Diagnoses   Final diagnoses:  Neuritis    New Prescriptions Discharge Medication List as of 08/22/2016   1:52 PM    START taking these medications   Details  gabapentin (NEURONTIN) 300 MG capsule Take one pill tid, Print         Milton Ferguson, MD 08/23/16 1622

## 2016-08-26 ENCOUNTER — Telehealth: Payer: Self-pay | Admitting: *Deleted

## 2016-08-26 NOTE — Telephone Encounter (Signed)
-----   Message from Ladell Pier, MD sent at 08/23/2016  4:35 PM EST ----- Please call patient, CT shows decrease in size of radiated lung lesion, no evidence of disease progression

## 2016-08-26 NOTE — Telephone Encounter (Signed)
Called pt with CT result per Dr. Gearldine Shown note below. She voiced appreciation for call. Next appt confirmed.

## 2016-08-29 ENCOUNTER — Ambulatory Visit (HOSPITAL_BASED_OUTPATIENT_CLINIC_OR_DEPARTMENT_OTHER): Payer: Medicare Other | Admitting: Oncology

## 2016-08-29 ENCOUNTER — Telehealth: Payer: Self-pay | Admitting: Oncology

## 2016-08-29 VITALS — BP 174/74 | HR 90 | Temp 98.5°F | Resp 18 | Ht 64.0 in | Wt 126.9 lb

## 2016-08-29 DIAGNOSIS — R918 Other nonspecific abnormal finding of lung field: Secondary | ICD-10-CM

## 2016-08-29 DIAGNOSIS — Z85118 Personal history of other malignant neoplasm of bronchus and lung: Secondary | ICD-10-CM

## 2016-08-29 DIAGNOSIS — Z85858 Personal history of malignant neoplasm of other endocrine glands: Secondary | ICD-10-CM | POA: Diagnosis not present

## 2016-08-29 DIAGNOSIS — C7490 Malignant neoplasm of unspecified part of unspecified adrenal gland: Secondary | ICD-10-CM

## 2016-08-29 NOTE — Telephone Encounter (Signed)
Gave patient schedule and AVS per 08/29/2016 - patient prefers afternoon time . [facility] to contact patient with CT schedule.

## 2016-08-29 NOTE — Progress Notes (Signed)
[place] OFFICE PROGRESS NOTE   Diagnosis: Adrenal carcinoma, lung cancer  INTERVAL HISTORY:   Leslie Petersen returns as scheduled. A restaging chest CT 08/23/2016 revealed a decrease in the size of the right upper lobe nodule. She has intermittent pain lasting seconds at the left upper anterior chest. This occurs at rest and there are no associated symptoms. She complains of "burning" discomfort in the legs and feet in the evening. She was seen in emergency room on 08/22/2016. The pain was prescribed. The pain has improved.  Objective:  Vital signs in last 24 hours:  Blood pressure (!) 174/74, pulse 90, temperature 98.5 F (36.9 C), temperature source Oral, resp. rate 18, height '5\' 4"'$  (1.626 m), weight 126 lb 14.4 oz (57.6 kg), SpO2 96 %.    HEENT: Neck without mass Lymphatics: No cervical, supraclavicular, axillary, or inguinal nodes Resp: Lungs clear bilaterally Cardio: Regular rate and rhythm GI: No hepatosplenomegaly, no mass, nontender Vascular: No leg edema Musculoskeletal: No tenderness at the left chest anterior wall , no sign of recurrent tumor at the lower back incision site   Medications: I have reviewed the patient's current medications.  Assessment/Plan: 1. 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 2. Left upper lung adenocarcinoma (T1 N0), status post a wedge resection, 08/15/2009. 3. Left adrenocortical carcinoma, status post a left adrenalectomy in 1994. 4. CT of the chest and abdomen, 09/04/2012. No evidence of tumor recurrence or metastatic disease in the chest or abdomen. 5. Soft tissue nodule noted on the CT 09/04/2012, posterior to the left sacrum-present on a PET/CT 07/06/2009 without hypermetabolic activity. Larger on the pelvic CT 09/04/2013, the lesion had associated enhancement on an MRI 09/16/2013  Biopsy of the left paraspinous mass 10/01/2013 confirmed metastatic carcinoma consistent with  metastatic adrenocortical carcinoma  Excision of the left paraspinous mass at [facility] on 12/03/2013 with the pathology confirming metastatic adrenocortical carcinoma with negative surgical margins  Restaging CTs 09/28/2014 with an enlarging right upper lobe nodule and no other evidence of metastatic disease  PET scan 10/11/2014 confirmed a hypermetabolic right upper lobe nodule and a hypermetabolic AP window lymph node  EBUS biopsy of a 4L node on 10/18/2014 revealed rare atypical cells-nondiagnostic  CT 01/25/2015 confirmed enlargement of the right upper lung nodule and slight enlargement of mediastinal lymph nodes  EBUS biopsy of the right lung mass and a level 5L node on 02/06/2015 were nondiagnostic  SBRT treatment to the right upper lung mass in 3 fractions completed 03/20/2015  CT chest 08/17/2015-postradiation changes surrounding the previous right upper lobe nodule, no evidence of metastatic disease in chest  CT chest 02/21/2016-evolution of postradiation changes in the right upper lobe, no evidence of recurrent disease  CT chest 08/23/2016-decrease in the right upper lobe lesion with surrounding radiation change 6. Enterococcus urinary tract infection 09/04/2013 7. Esophagitis,? Secondary to Fosamax, improved with Protonix 8. CT of the chest 06/12/2014 with an indeterminate right lung nodule  Progressive enlargement of a sub-solid right upper lobe nodule noted on the CT 09/28/2014 9. Duodenal polyp, status post a biopsy confirming a tubular adenoma on an endoscopy 06/22/2014  Removal of a duodenal polyp 09/16/2014-pathology revealed a duodenal adenoma, no high-grade dysplasia or malignancy identified     Disposition:  [name] remains in clinical remission from lung cancer and adrenocortical carcinoma. There is no clinical or x-ray evidence of disease progression. She will return for an office visit and chest CT in 8 months. She will contact us in  the interim as  needed.  15 minutes were spent with the patient today. The majority of the time was used for counseling and coordination of care.  Betsy Coder, MD  08/29/2016  4:13 PM

## 2016-10-01 IMAGING — CT CT CHEST W/ CM
2 of 4 series · 15 of 36 positions shown, 18 images · IV contrast (OMNIPAQUE)
Comparison: PET 10/11/2014 and CT chest 09/28/2014.

CLINICAL DATA: Right upper lobe nodule, left lung cancer.

EXAM:
CT CHEST WITH CONTRAST
TECHNIQUE: Multidetector CT imaging of the chest was performed during
intravenous contrast administration.
CONTRAST:  75mL OMNIPAQUE IOHEXOL 300 MG/ML  SOLN

[Series 2: chest with st · axial · 0.71mm/px · z∈[-325,-35]mm · 12 of 68 slices shown, 15 images]
[im 5/68  mediastinal]
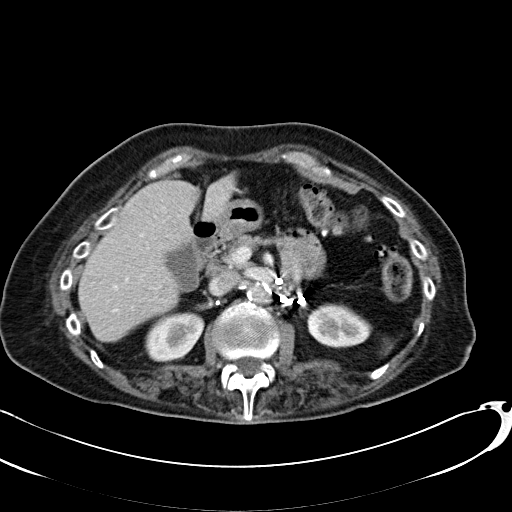
[im 5/68  lung]
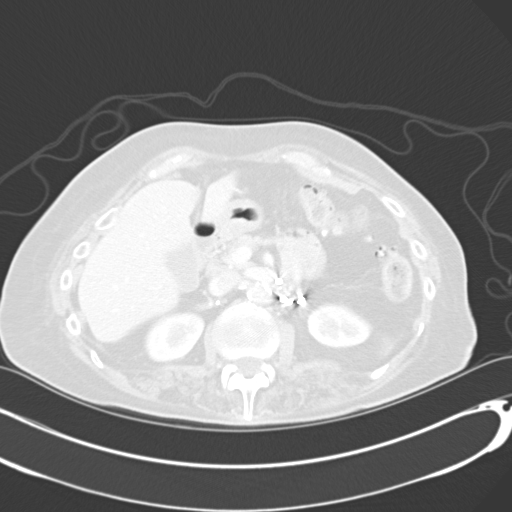
[im 10/68  lung]
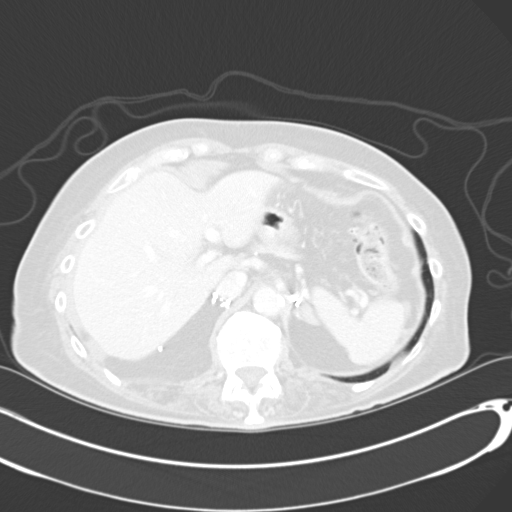
[im 15/68  lung]
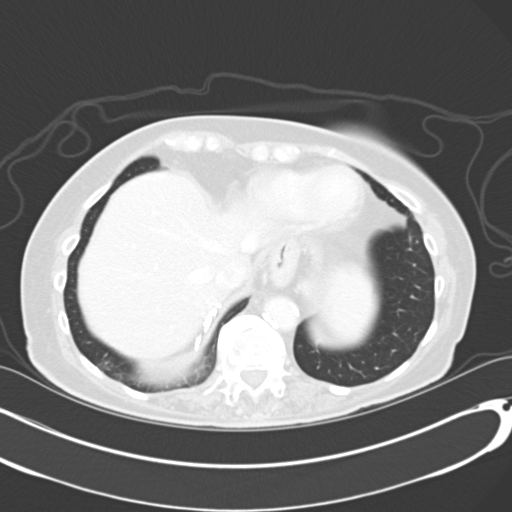
[im 20/68  lung]
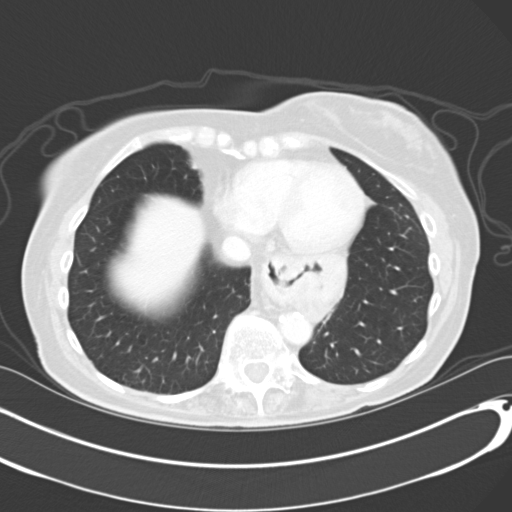
[im 24/68  mediastinal]
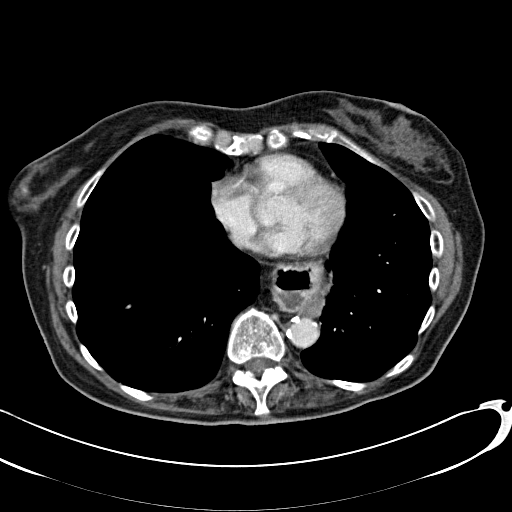
[im 24/68  lung]
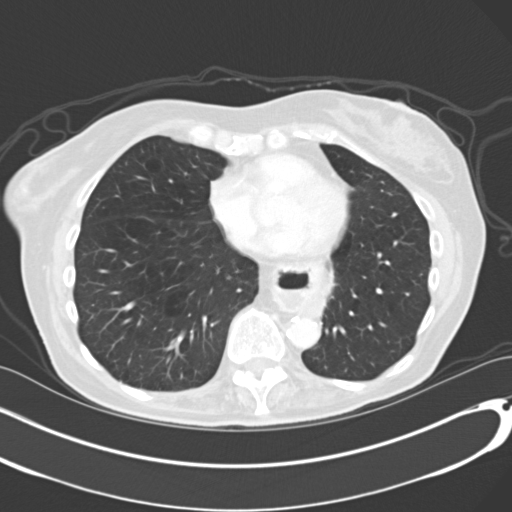
[im 29/68  lung]
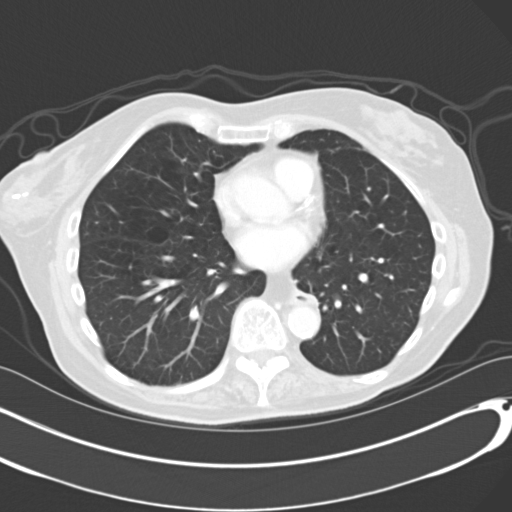
[im 39/68  lung]
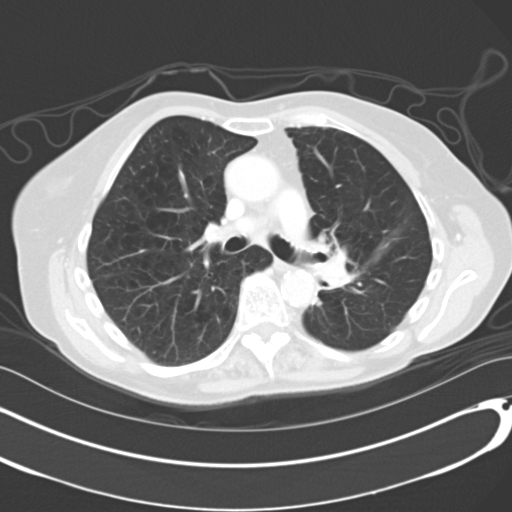
[im 44/68  lung]
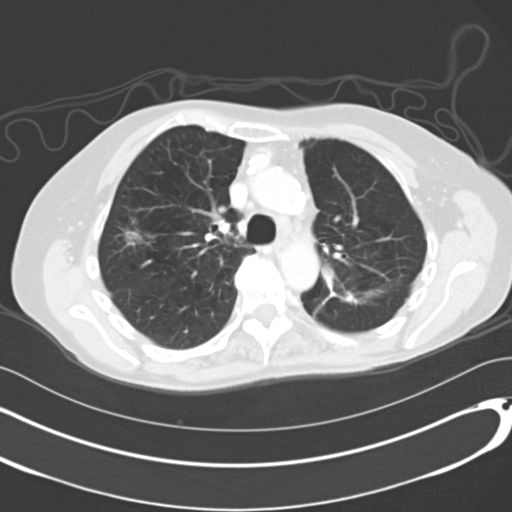
[im 48/68  mediastinal]
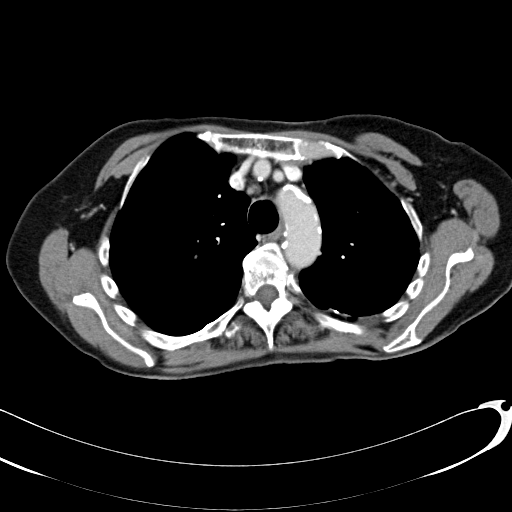
[im 48/68  lung]
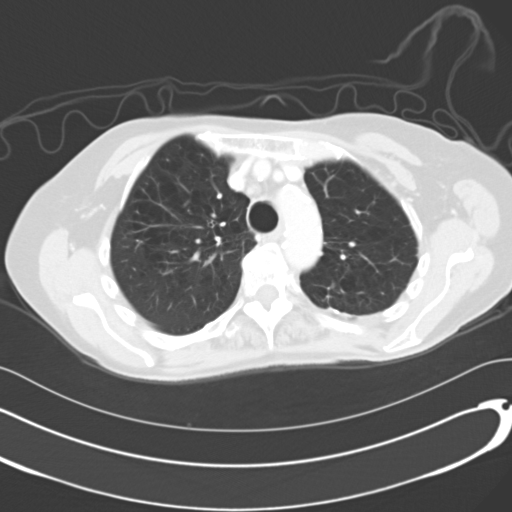
[im 53/68  lung]
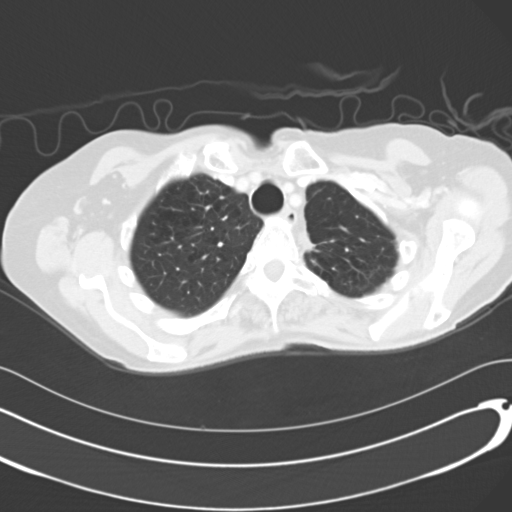
[im 58/68  lung]
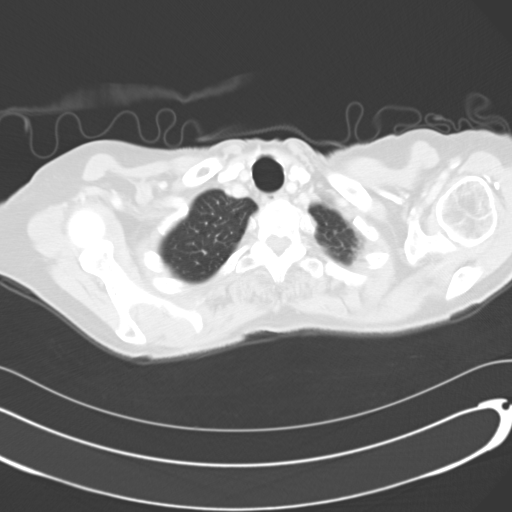
[im 63/68  lung]
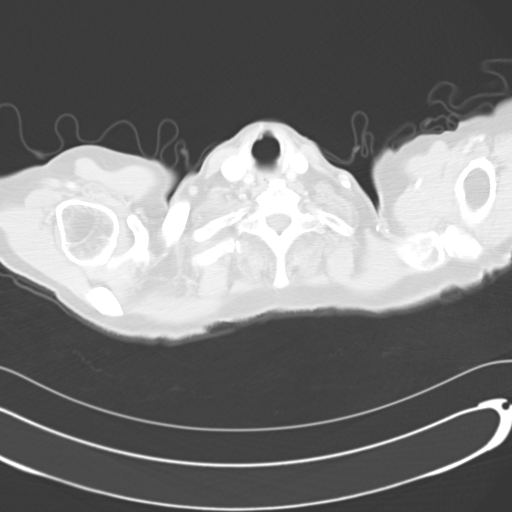

[Series 602: <mpr thick range> · coronal · 0.71mm/px · 3 of 81 slices shown]
[im 17/81  lung]
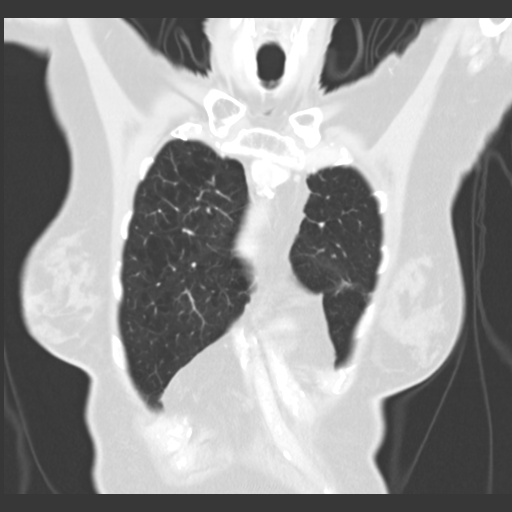
[im 33/81  lung]
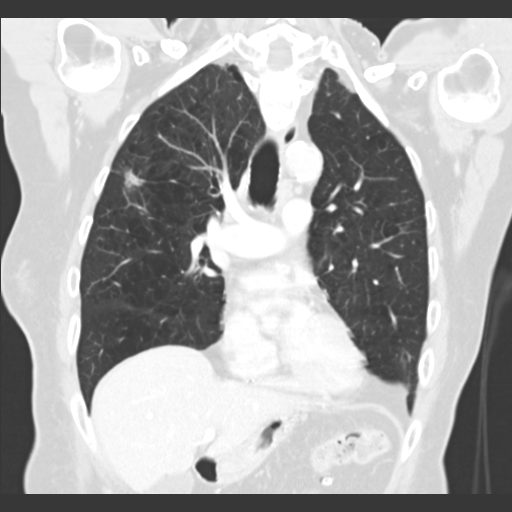
[im 49/81  lung]
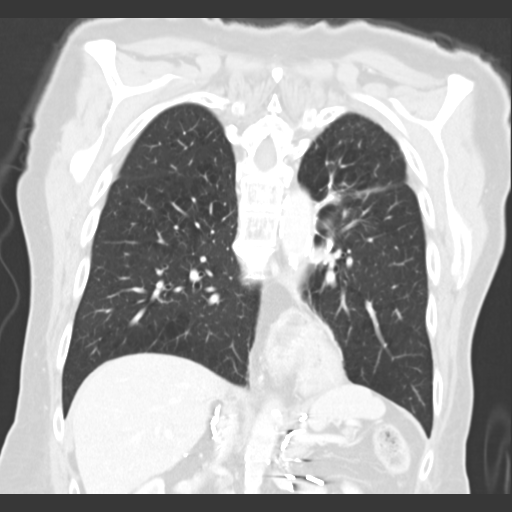

[15 of 36 positions shown; findings below may reference images not displayed]

FINDINGS: Mediastinum/Nodes: Mediastinal lymph nodes may be minimally more
prominent, measuring up to 10 mm in short axis in the low left
paratracheal station (Previously 8 mm). No hilar or axillary
adenopathy. Coronary artery calcification. Heart size normal. No
pericardial effusion. Moderate hiatal hernia.

Lungs/Pleura: Centrilobular emphysema. A spiculated ground-glass
nodule with internal lucencies in the right upper lobe measures
x 2.6 cm (series 5, image 24), previously 1.6 x 2.0 cm when
remeasured. Smaller ground-glass nodular density in the right upper
lobe (image 18), stable. Postoperative scarring and volume loss in
the left hemi thorax, stable. No pleural fluid. Airway is otherwise
unremarkable.

Upper abdomen: Visualized portions of the liver and gallbladder are
unremarkable. Bilateral adrenalectomies. 5 mm low-attenuation lesion
in the upper pole right kidney is too small to characterize but
stable. Visualized portions of the kidneys, spleen and pancreas are
otherwise unremarkable. Moderate hiatal hernia. No upper abdominal
adenopathy.

Musculoskeletal: No worrisome lytic or sclerotic lesions. T12
compression fracture is unchanged.
IMPRESSION: 1. Enlarging spiculated right upper lobe ground-glass nodule, highly
worrisome for bronchogenic carcinoma(adenocarcinoma in situ).
2. Mediastinal lymph nodes appear minimally more prominent than on
09/28/2014.
3. Coronary artery calcification.

## 2016-12-24 ENCOUNTER — Encounter: Payer: Self-pay | Admitting: Internal Medicine

## 2017-05-01 ENCOUNTER — Ambulatory Visit (HOSPITAL_COMMUNITY)
Admission: RE | Admit: 2017-05-01 | Discharge: 2017-05-01 | Disposition: A | Discharge status: Home / Self Care | Payer: Medicare Other | Source: Ambulatory Visit | Attending: Oncology | Admitting: Oncology

## 2017-05-01 DIAGNOSIS — C3490 Malignant neoplasm of unspecified part of unspecified bronchus or lung: Secondary | ICD-10-CM | POA: Insufficient documentation

## 2017-05-01 DIAGNOSIS — K449 Diaphragmatic hernia without obstruction or gangrene: Secondary | ICD-10-CM | POA: Diagnosis not present

## 2017-05-01 DIAGNOSIS — J439 Emphysema, unspecified: Secondary | ICD-10-CM | POA: Insufficient documentation

## 2017-05-01 DIAGNOSIS — Z9889 Other specified postprocedural states: Secondary | ICD-10-CM | POA: Insufficient documentation

## 2017-05-01 DIAGNOSIS — C7490 Malignant neoplasm of unspecified part of unspecified adrenal gland: Secondary | ICD-10-CM | POA: Diagnosis present

## 2017-05-01 DIAGNOSIS — I700 Atherosclerosis of aorta: Secondary | ICD-10-CM | POA: Insufficient documentation

## 2017-05-06 ENCOUNTER — Encounter: Payer: Self-pay | Admitting: Nurse Practitioner

## 2017-05-06 ENCOUNTER — Ambulatory Visit: Payer: Medicare Other | Admitting: Nurse Practitioner

## 2017-05-06 ENCOUNTER — Telehealth: Payer: Self-pay | Admitting: Oncology

## 2017-05-06 VITALS — BP 158/67 | HR 92 | Temp 99.1°F | Resp 17 | Ht 64.0 in | Wt 128.8 lb

## 2017-05-06 DIAGNOSIS — Z85858 Personal history of malignant neoplasm of other endocrine glands: Secondary | ICD-10-CM | POA: Diagnosis not present

## 2017-05-06 DIAGNOSIS — Z85118 Personal history of other malignant neoplasm of bronchus and lung: Secondary | ICD-10-CM | POA: Diagnosis not present

## 2017-05-06 DIAGNOSIS — C3412 Malignant neoplasm of upper lobe, left bronchus or lung: Secondary | ICD-10-CM

## 2017-05-06 DIAGNOSIS — C7490 Malignant neoplasm of unspecified part of unspecified adrenal gland: Secondary | ICD-10-CM

## 2017-05-06 DIAGNOSIS — R918 Other nonspecific abnormal finding of lung field: Secondary | ICD-10-CM

## 2017-05-06 DIAGNOSIS — C3491 Malignant neoplasm of unspecified part of right bronchus or lung: Secondary | ICD-10-CM

## 2017-05-06 NOTE — Progress Notes (Addendum)
[place] OFFICE PROGRESS NOTE   Diagnosis: Adrenal carcinoma, lung cancer  INTERVAL HISTORY:   Leslie Petersen returns as scheduled.  She feels well.  No interim illnesses or infections.  She has a good appetite.  No shortness of breath, cough or fever.  She continues to have a burning discomfort at the lower legs mainly at bedtime.  She takes gabapentin which helps.  Objective:  Vital signs in last 24 hours:  Blood pressure (!) 158/67, pulse 92, temperature 99.1 F (37.3 C), temperature source Oral, resp. rate 17, height 5\' 4"  (1.626 m), weight 128 lb 12.8 oz (58.4 kg), SpO2 98 %.    HEENT: Neck without mass. Lymphatics: No palpable cervical, supraclavicular, axillary or inguinal lymph nodes. Resp: Lungs clear bilaterally. Cardio: Regular rate and rhythm. GI: Abdomen soft and nontender.  No hepatomegaly. Vascular: No leg edema. Musculoskeletal: No sign of recurrent tumor at the left lower back incision site.   Lab Results:  Lab Results  Component Value Date   WBC 6.6 08/22/2016   HGB 13.6 08/22/2016   HCT 40.1 08/22/2016   MCV 101.3 (H) 08/22/2016   PLT 268 08/22/2016   NEUTROABS 4.7 08/22/2016    Imaging:  No results found.  Medications: I have reviewed the patient's current medications.  Assessment/Plan: 1. 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 2. Left upper lung adenocarcinoma (T1 N0), status post a wedge resection, 08/15/2009. 3. Left adrenocortical carcinoma, status post a left adrenalectomy in 1994. 4. CT of the chest and abdomen, 09/04/2012. No evidence of tumor recurrence or metastatic disease in the chest or abdomen. 5. Soft tissue nodule noted on the CT 09/04/2012, posterior to the left sacrum-present on a PET/CT 07/06/2009 without hypermetabolic activity. Larger on the pelvic CT 09/04/2013, the lesion had associated enhancement on an MRI 09/16/2013  Biopsy of the left paraspinous mass  10/01/2013 confirmed metastatic carcinoma consistent with metastatic adrenocortical carcinoma  Excision of the left paraspinous mass at [facility] on 12/03/2013 with the pathology confirming metastatic adrenocortical carcinoma with negative surgical margins  Restaging CTs 09/28/2014 with an enlarging right upper lobe nodule and no other evidence of metastatic disease  PET scan 10/11/2014 confirmed a hypermetabolic right upper lobe nodule and a hypermetabolic AP window lymph node  EBUS biopsy of a 4L node on 10/18/2014 revealed rare atypical cells-nondiagnostic  CT 01/25/2015 confirmed enlargement of the right upper lung nodule and slight enlargement of mediastinal lymph nodes  EBUS biopsy of the right lung mass and a level 5L node on 02/06/2015 were nondiagnostic  SBRT treatment to the right upper lung mass in 3 fractions completed 03/20/2015  CT chest 08/17/2015-postradiation changes surrounding the previous right upper lobe nodule, no evidence of metastatic disease in chest  CT chest 02/21/2016-evolution of postradiation changes in the right upper lobe, no evidence of recurrent disease  CT chest 08/23/2016-decrease in the right upper lobe lesion with surrounding radiation change 6. Enterococcus urinary tract infection 09/04/2013 7. Esophagitis,? Secondary to Fosamax, improved with Protonix 8. CT of the chest 06/12/2014 with an indeterminate right lung nodule  Progressive enlargement of a sub-solid right upper lobe nodule noted on the CT 09/28/2014 9. Duodenal polyp, status post a biopsy confirming a tubular adenoma on an endoscopy 06/22/2014  Removal of a duodenal polyp 09/16/2014-pathology revealed a duodenal adenoma, no high-grade dysplasia or malignancy identified   Disposition: Leslie Petersen remains in clinical remission from lung cancer and adrenocortical carcinoma.  There is no clinical or radiographic evidence of  disease progression.  He will return for follow-up visit and  chest CT in 1 year.  She will contact the office in the interim with any problems.  Patient seen with Dr. Benay Spice.    Ned Card ANP/GNP-BC   05/06/2017  3:59 PM  This was a shared visit with Ned Card.  Leslie Petersen remains in clinical remission.  The restaging CT shows no evidence of recurrent lung cancer or adrenocortical carcinoma.  Julieanne Manson, MD

## 2017-05-06 NOTE — Telephone Encounter (Signed)
Gave avs and calendar for November 2019

## 2018-05-06 ENCOUNTER — Ambulatory Visit (HOSPITAL_COMMUNITY)
Admission: RE | Admit: 2018-05-06 | Discharge: 2018-05-06 | Disposition: A | Discharge status: Home / Self Care | Payer: Medicare Other | Source: Ambulatory Visit | Attending: Nurse Practitioner | Admitting: Nurse Practitioner

## 2018-05-06 DIAGNOSIS — I700 Atherosclerosis of aorta: Secondary | ICD-10-CM | POA: Insufficient documentation

## 2018-05-06 DIAGNOSIS — J439 Emphysema, unspecified: Secondary | ICD-10-CM | POA: Insufficient documentation

## 2018-05-06 DIAGNOSIS — C3412 Malignant neoplasm of upper lobe, left bronchus or lung: Secondary | ICD-10-CM | POA: Insufficient documentation

## 2018-05-06 DIAGNOSIS — I2510 Atherosclerotic heart disease of native coronary artery without angina pectoris: Secondary | ICD-10-CM | POA: Insufficient documentation

## 2018-05-06 DIAGNOSIS — C7490 Malignant neoplasm of unspecified part of unspecified adrenal gland: Secondary | ICD-10-CM | POA: Insufficient documentation

## 2018-05-06 DIAGNOSIS — K449 Diaphragmatic hernia without obstruction or gangrene: Secondary | ICD-10-CM | POA: Diagnosis not present

## 2018-05-07 ENCOUNTER — Telehealth: Payer: Self-pay | Admitting: *Deleted

## 2018-05-07 NOTE — Telephone Encounter (Signed)
Notified of CT scan being negative for cancer and to follow up as scheduled.

## 2018-05-07 NOTE — Telephone Encounter (Signed)
-----   Message from Ladell Pier, MD sent at 05/06/2018 10:12 PM EST ----- Please call patient, CT is negative for cancer, f/u as scheduled

## 2018-05-12 ENCOUNTER — Telehealth: Payer: Self-pay | Admitting: Oncology

## 2018-05-12 ENCOUNTER — Inpatient Hospital Stay: Payer: Medicare Other | Attending: Oncology | Admitting: Oncology

## 2018-05-12 VITALS — BP 150/89 | HR 107 | Temp 98.3°F | Resp 19 | Ht 64.0 in | Wt 124.1 lb

## 2018-05-12 DIAGNOSIS — C7490 Malignant neoplasm of unspecified part of unspecified adrenal gland: Secondary | ICD-10-CM

## 2018-05-12 DIAGNOSIS — M545 Low back pain: Secondary | ICD-10-CM | POA: Insufficient documentation

## 2018-05-12 DIAGNOSIS — G8929 Other chronic pain: Secondary | ICD-10-CM

## 2018-05-12 DIAGNOSIS — Z85858 Personal history of malignant neoplasm of other endocrine glands: Secondary | ICD-10-CM | POA: Diagnosis not present

## 2018-05-12 DIAGNOSIS — R918 Other nonspecific abnormal finding of lung field: Secondary | ICD-10-CM

## 2018-05-12 DIAGNOSIS — Z85118 Personal history of other malignant neoplasm of bronchus and lung: Secondary | ICD-10-CM | POA: Diagnosis not present

## 2018-05-12 DIAGNOSIS — M25561 Pain in right knee: Secondary | ICD-10-CM

## 2018-05-12 NOTE — Telephone Encounter (Signed)
Gave pt avs and calendar  °

## 2018-05-12 NOTE — Progress Notes (Signed)
  [place] OFFICE PROGRESS NOTE   Diagnosis: Lung cancer, adrenal cancer  INTERVAL HISTORY:   Leslie Petersen returns as scheduled.  She feels well.  Good appetite.  She has chronic back pain.  She has pain at the right knee.  She reports her primary physician feels this may be related to her back.  Objective:  Vital signs in last 24 hours:  Blood pressure (!) 150/89, pulse (!) 107, temperature 98.3 F (36.8 C), temperature source Oral, resp. rate 19, height 5\' 4"  (1.626 m), weight 124 lb 1.6 oz (56.3 kg), SpO2 97 %.    HEENT: Neck without mass Lymphatics: No cervical, supraclavicular, axillary, or inguinal nodes Resp: Lungs clear bilaterally Cardio: Regular rate and rhythm GI: No hepatosplenomegaly, no mass, nontender Vascular: No leg edema Skin: Left sacral scar without evidence of recurrent tumor   Medications: I have reviewed the patient's current medications.  Assessment/plan:  1. 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 2. Left upper lung adenocarcinoma (T1 N0), status post a wedge resection, 08/15/2009. 3. Left adrenocortical carcinoma, status post a left adrenalectomy in 1994. 4. CT of the chest and abdomen, 09/04/2012. No evidence of tumor recurrence or metastatic disease in the chest or abdomen. 5. Soft tissue nodule noted on the CT 09/04/2012, posterior to the left sacrum-present on a PET/CT 07/06/2009 without hypermetabolic activity. Larger on the pelvic CT 09/04/2013, the lesion had associated enhancement on an MRI 09/16/2013  Biopsy of the left paraspinous mass 10/01/2013 confirmed metastatic carcinoma consistent with metastatic adrenocortical carcinoma  Excision of the left paraspinous mass at [facility] on 12/03/2013 with the pathology confirming metastatic adrenocortical carcinoma with negative surgical margins  Restaging CTs 09/28/2014 with an enlarging right upper lobe nodule and no other evidence of  metastatic disease  PET scan 10/11/2014 confirmed a hypermetabolic right upper lobe nodule and a hypermetabolic AP window lymph node  EBUS biopsy of a 4L node on 10/18/2014 revealed rare atypical cells-nondiagnostic  CT 01/25/2015 confirmed enlargement of the right upper lung nodule and slight enlargement of mediastinal lymph nodes  EBUS biopsy of the right lung mass and a level 5L node on 02/06/2015 were nondiagnostic  SBRT treatment to the right upper lung mass in 3 fractions completed 03/20/2015  CT chest 08/17/2015-postradiation changes surrounding the previous right upper lobe nodule, no evidence of metastatic disease in chest  CT chest 02/21/2016-evolution of postradiation changes in the right upper lobe, no evidence of recurrent disease  CT chest 08/23/2016-decrease in the right upper lobe lesion with surrounding radiation change  CT chest 05/06/2018-posttreatment scarring in the right upper lobe, no evidence of metastatic disease 6. Enterococcus urinary tract infection 09/04/2013 7. Esophagitis,? Secondary to Fosamax, improved with Protonix 8. CT of the chest 06/12/2014 with an indeterminate right lung nodule  Progressive enlargement of a sub-solid right upper lobe nodule noted on the CT 09/28/2014 9. Duodenal polyp, status post a biopsy confirming a tubular adenoma on an endoscopy 06/22/2014  Removal of a duodenal polyp 09/16/2014-pathology revealed a duodenal adenoma, no high-grade dysplasia or malignancy identified     Disposition: Ms. Sherburne appears well.  She is in clinical remission from adrenocortical and lung cancers area she will return for an office visit and restaging chest CT in 1 year. 15 minutes were spent with the patient today.  The majority of the time was used for counseling and coordination of care.  Betsy Coder, MD  05/12/2018  4:10 PM

## 2019-05-04 ENCOUNTER — Ambulatory Visit: Payer: Medicare Other | Admitting: Oncology

## 2019-05-11 ENCOUNTER — Telehealth: Payer: Self-pay | Admitting: Oncology

## 2019-05-11 ENCOUNTER — Encounter (HOSPITAL_COMMUNITY): Payer: Self-pay

## 2019-05-11 ENCOUNTER — Ambulatory Visit (HOSPITAL_COMMUNITY)
Admission: RE | Admit: 2019-05-11 | Discharge: 2019-05-11 | Disposition: A | Discharge status: Home / Self Care | Payer: Medicare Other | Source: Ambulatory Visit | Attending: Oncology | Admitting: Oncology

## 2019-05-11 ENCOUNTER — Other Ambulatory Visit: Payer: Self-pay

## 2019-05-11 DIAGNOSIS — K449 Diaphragmatic hernia without obstruction or gangrene: Secondary | ICD-10-CM | POA: Insufficient documentation

## 2019-05-11 DIAGNOSIS — J701 Chronic and other pulmonary manifestations due to radiation: Secondary | ICD-10-CM | POA: Diagnosis not present

## 2019-05-11 DIAGNOSIS — J439 Emphysema, unspecified: Secondary | ICD-10-CM | POA: Insufficient documentation

## 2019-05-11 DIAGNOSIS — C7490 Malignant neoplasm of unspecified part of unspecified adrenal gland: Secondary | ICD-10-CM | POA: Diagnosis present

## 2019-05-11 DIAGNOSIS — I700 Atherosclerosis of aorta: Secondary | ICD-10-CM | POA: Insufficient documentation

## 2019-05-11 HISTORY — DX: Secondary malignant neoplasm of unspecified adrenal gland: C79.70

## 2019-05-11 NOTE — Telephone Encounter (Signed)
Called patient per providers request, left a voicemail.

## 2019-05-11 NOTE — Telephone Encounter (Signed)
Patient returned phone call regarding voicemail that was left, per patient's request this will be a phone visit.

## 2019-05-12 ENCOUNTER — Telehealth: Payer: Self-pay | Admitting: *Deleted

## 2019-05-12 NOTE — Telephone Encounter (Signed)
-----   Message from Ladell Pier, MD sent at 05/12/2019  2:25 PM EST ----- Please call patient, chest CT shows no evidence of cancer, follow-up as scheduled

## 2019-05-12 NOTE — Telephone Encounter (Signed)
Per Dr. Benay Spice, called and made pt aware that chest CT shows no evidence of cancer and to f/u as scheduled. Vm was left and was advised if there were any other concerns to call office

## 2019-05-17 ENCOUNTER — Inpatient Hospital Stay: Payer: Medicare Other | Attending: Oncology | Admitting: Oncology

## 2019-05-17 DIAGNOSIS — C7490 Malignant neoplasm of unspecified part of unspecified adrenal gland: Secondary | ICD-10-CM | POA: Diagnosis not present

## 2019-05-17 NOTE — Progress Notes (Signed)
[place] OFFICE VISIT PROGRESS NOTE  I connected with Leslie Petersen on 05/17/19 at  8:00 AM EST by  and verified that I am speaking with the correct person using two identifiers.   I discussed the limitations, risks, security and privacy concerns of performing an evaluation and management service by telemedicine and the availability of in-person appointments. I also discussed with the patient that there may be a patient responsible charge related to this service. The patient expressed understanding and agreed to proceed.    Patient's location: Home Provider's location: Office   Diagnosis: Adrenocortical carcinoma  INTERVAL HISTORY:   Leslie Petersen is seen today for a telehealth visit.  This is secondary to distancing with the Covid pandemic.  She reports feeling well.  Good appetite.  No pain.  No dyspnea.  No complaint.  She has received an influenza vaccine.  She is practicing social distancing with the Covid pandemic.  Objective:   Lab Results:  Lab Results  Component Value Date   WBC 6.6 08/22/2016   HGB 13.6 08/22/2016   HCT 40.1 08/22/2016   MCV 101.3 (H) 08/22/2016   PLT 268 08/22/2016   NEUTROABS 4.7 08/22/2016    Imaging:  No results found.  Medications: I have reviewed the patient's current medications.  Assessment/Plan: 1. 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 2. Left upper lung adenocarcinoma (T1 N0), status post a wedge resection, 08/15/2009. 3. Left adrenocortical carcinoma, status post a left adrenalectomy in 1994. 4. CT of the chest and abdomen, 09/04/2012. No evidence of tumor recurrence or metastatic disease in the chest or abdomen. 5. Soft tissue nodule noted on the CT 09/04/2012, posterior to the left sacrum-present on a PET/CT 07/06/2009 without hypermetabolic activity. Larger on the pelvic CT 09/04/2013, the lesion had associated enhancement on an MRI  09/16/2013  Biopsy of the left paraspinous mass 10/01/2013 confirmed metastatic carcinoma consistent with metastatic adrenocortical carcinoma  Excision of the left paraspinous mass at [facility] on 12/03/2013 with the pathology confirming metastatic adrenocortical carcinoma with negative surgical margins  Restaging CTs 09/28/2014 with an enlarging right upper lobe nodule and no other evidence of metastatic disease  PET scan 10/11/2014 confirmed a hypermetabolic right upper lobe nodule and a hypermetabolic AP window lymph node  EBUS biopsy of a 4L node on 10/18/2014 revealed rare atypical cells-nondiagnostic  CT 01/25/2015 confirmed enlargement of the right upper lung nodule and slight enlargement of mediastinal lymph nodes  EBUS biopsy of the right lung mass and a level 5L node on 02/06/2015 were nondiagnostic  SBRT treatment to the right upper lung mass in 3 fractions completed 03/20/2015  CT chest 08/17/2015-postradiation changes surrounding the previous right upper lobe nodule, no evidence of metastatic disease in chest  CT chest 02/21/2016-evolution of postradiation changes in the right upper lobe, no evidence of recurrent disease  CT chest 08/23/2016-decrease in the right upper lobe lesion with surrounding radiation change  CT chest 05/06/2018-posttreatment scarring in the right upper lobe, no evidence of metastatic disease  CT chest 05/11/2019-no evidence of metastatic disease in the chest, stable right upper lobe fibrosis, stable left upper lobe wedge resection 6. Enterococcus urinary tract infection 09/04/2013 7. Esophagitis,? Secondary to Fosamax, improved with Protonix 8. CT of the chest 06/12/2014 with an indeterminate right lung nodule  Progressive enlargement of a sub-solid right upper lobe nodule noted on the CT 09/28/2014 9. Duodenal polyp, status post a biopsy confirming a tubular adenoma on an endoscopy  06/22/2014  Removal of a duodenal polyp 09/16/2014-pathology  revealed a duodenal adenoma, no high-grade dysplasia or malignancy identified     Disposition: Leslie Petersen is in clinical remission from adrenocortical carcinoma and non-small cell lung cancer.  She will continue follow-up at the Cancer center.  She will be scheduled for an office visit and restaging chest CT in 1 year.  We discussed the indication for continuing mammography.  She plans to continue mammography based on her personal and family history of cancer.  Her sister died of breast cancer.   I discussed the assessment and treatment plan with the patient. The patient was provided an opportunity to ask questions and all were answered. The patient agreed with the plan and demonstrated an understanding of the instructions.   The patient was advised to call back or seek an in-person evaluation if the symptoms worsen or if the condition fails to improve as anticipated.  I provided 15 minutes of telephone, chart review, and documentation time during this encounter, and > 50% was spent counseling as documented under my assessment & plan.  Betsy Coder ANP/GNP-BC   05/17/2019 8:03 AM

## 2019-05-18 ENCOUNTER — Telehealth: Payer: Self-pay | Admitting: Oncology

## 2019-05-18 NOTE — Telephone Encounter (Signed)
Scheduled per los. Called and left msg. Mailed printout  °

## 2019-09-09 ENCOUNTER — Telehealth: Payer: Self-pay

## 2019-09-09 NOTE — Telephone Encounter (Signed)
Call placed to pt's daughter as requested to review pt's appointments

## 2020-03-28 ENCOUNTER — Telehealth: Payer: Self-pay | Admitting: Oncology

## 2020-03-28 NOTE — Telephone Encounter (Signed)
Rescheduled appointment per provided On-Call schedule. Spoke to patient who is aware of appointment date and time.

## 2020-05-09 ENCOUNTER — Other Ambulatory Visit: Payer: Self-pay

## 2020-05-09 ENCOUNTER — Ambulatory Visit (HOSPITAL_COMMUNITY)
Admission: RE | Admit: 2020-05-09 | Discharge: 2020-05-09 | Disposition: A | Discharge status: Home / Self Care | Payer: Medicare Other | Source: Ambulatory Visit | Attending: Oncology | Admitting: Oncology

## 2020-05-09 ENCOUNTER — Encounter (HOSPITAL_COMMUNITY): Payer: Self-pay

## 2020-05-09 DIAGNOSIS — I2510 Atherosclerotic heart disease of native coronary artery without angina pectoris: Secondary | ICD-10-CM | POA: Diagnosis not present

## 2020-05-09 DIAGNOSIS — K449 Diaphragmatic hernia without obstruction or gangrene: Secondary | ICD-10-CM | POA: Diagnosis not present

## 2020-05-09 DIAGNOSIS — J432 Centrilobular emphysema: Secondary | ICD-10-CM | POA: Insufficient documentation

## 2020-05-09 DIAGNOSIS — I700 Atherosclerosis of aorta: Secondary | ICD-10-CM | POA: Diagnosis not present

## 2020-05-09 DIAGNOSIS — C7490 Malignant neoplasm of unspecified part of unspecified adrenal gland: Secondary | ICD-10-CM | POA: Diagnosis not present

## 2020-05-09 DIAGNOSIS — Z923 Personal history of irradiation: Secondary | ICD-10-CM | POA: Diagnosis not present

## 2020-05-10 ENCOUNTER — Telehealth: Payer: Self-pay | Admitting: *Deleted

## 2020-05-10 NOTE — Telephone Encounter (Signed)
Notified that CT shows no evidence of cancer. F/U as scheduled

## 2020-05-10 NOTE — Telephone Encounter (Signed)
-----   Message from Ladell Pier, MD sent at 05/10/2020  2:50 PM EST ----- Please call patient, CT shows no evidence of recurrent cancer, follow-up as scheduled

## 2020-05-16 ENCOUNTER — Ambulatory Visit: Payer: Medicare Other | Admitting: Oncology

## 2020-05-18 ENCOUNTER — Inpatient Hospital Stay: Payer: Medicare Other | Attending: Oncology | Admitting: Oncology

## 2020-05-18 ENCOUNTER — Other Ambulatory Visit: Payer: Self-pay

## 2020-05-18 VITALS — BP 146/67 | HR 97 | Temp 97.0°F | Resp 17 | Ht 64.0 in | Wt 118.1 lb

## 2020-05-18 DIAGNOSIS — Z85858 Personal history of malignant neoplasm of other endocrine glands: Secondary | ICD-10-CM | POA: Diagnosis present

## 2020-05-18 DIAGNOSIS — C7490 Malignant neoplasm of unspecified part of unspecified adrenal gland: Secondary | ICD-10-CM

## 2020-05-18 DIAGNOSIS — Z85118 Personal history of other malignant neoplasm of bronchus and lung: Secondary | ICD-10-CM | POA: Insufficient documentation

## 2020-05-18 NOTE — Progress Notes (Signed)
[place] OFFICE PROGRESS NOTE   Diagnosis: Adrenal carcinoma, non-small cell lung cancer  INTERVAL HISTORY:   Leslie Petersen returns as scheduled.  She reports feeling well.  No pain.  No difficulty with bowel or bladder function.  No new masses.  She reports a good appetite.  Objective:  Vital signs in last 24 hours:  Blood pressure (!) 146/67, pulse 97, temperature (!) 97 F (36.1 C), temperature source Tympanic, resp. rate 17, height 5\' 4"  (1.626 m), weight 118 lb 1.6 oz (53.6 kg), SpO2 98 %.    Lymphatics: No cervical, supraclavicular, axillary, or inguinal nodes Resp: Lungs clear bilaterally Cardio: Regular rate and rhythm GI: No mass, nontender, no hepatosplenomegaly Vascular: No leg edema  Skin: Left lower back scar without evidence of recurrent tumor    Lab Results:  Lab Results  Component Value Date   WBC 6.6 08/22/2016   HGB 13.6 08/22/2016   HCT 40.1 08/22/2016   MCV 101.3 (H) 08/22/2016   PLT 268 08/22/2016   NEUTROABS 4.7 08/22/2016    CMP  Lab Results  Component Value Date   NA 143 08/17/2015   K 3.9 08/17/2015   CL 106 02/03/2015   CO2 26 08/17/2015   GLUCOSE 87 08/17/2015   BUN 21.9 08/17/2015   CREATININE 0.7 08/17/2015   CALCIUM 9.4 08/17/2015   PROT 6.7 02/03/2015   ALBUMIN 3.7 02/03/2015   AST 22 02/03/2015   ALT 16 02/03/2015   ALKPHOS 38 02/03/2015   BILITOT 0.6 02/03/2015   GFRNONAA >60 02/03/2015   GFRAA >60 02/03/2015     Medications: I have reviewed the patient's current medications.   Assessment/Plan: 1. 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 2. Left upper lung adenocarcinoma (T1 N0), status post a wedge resection, 08/15/2009. 3. Left adrenocortical carcinoma, status post a left adrenalectomy in 1994. 4. CT of the chest and abdomen, 09/04/2012. No evidence of tumor recurrence or metastatic disease in the chest or abdomen. 5. Soft tissue nodule noted on the CT  09/04/2012, posterior to the left sacrum-present on a PET/CT 07/06/2009 without hypermetabolic activity. Larger on the pelvic CT 09/04/2013, the lesion had associated enhancement on an MRI 09/16/2013  Biopsy of the left paraspinous mass 10/01/2013 confirmed metastatic carcinoma consistent with metastatic adrenocortical carcinoma  Excision of the left paraspinous mass at [facility] on 12/03/2013 with the pathology confirming metastatic adrenocortical carcinoma with negative surgical margins  Restaging CTs 09/28/2014 with an enlarging right upper lobe nodule and no other evidence of metastatic disease  PET scan 10/11/2014 confirmed a hypermetabolic right upper lobe nodule and a hypermetabolic AP window lymph node  EBUS biopsy of a 4L node on 10/18/2014 revealed rare atypical cells-nondiagnostic  CT 01/25/2015 confirmed enlargement of the right upper lung nodule and slight enlargement of mediastinal lymph nodes  EBUS biopsy of the right lung mass and a level 5L node on 02/06/2015 were nondiagnostic  SBRT treatment to the right upper lung mass in 3 fractions completed 03/20/2015  CT chest 08/17/2015-postradiation changes surrounding the previous right upper lobe nodule, no evidence of metastatic disease in chest  CT chest 02/21/2016-evolution of postradiation changes in the right upper lobe, no evidence of recurrent disease  CT chest 08/23/2016-decrease in the right upper lobe lesion with surrounding radiation change  CT chest 05/06/2018-posttreatment scarring in the right upper lobe, no evidence of metastatic disease  CT chest 05/11/2019-no evidence of metastatic disease in the chest, stable right upper lobe fibrosis, stable left upper lobe  wedge resection  CT chest 05/09/2020-unchanged bandlike radiation fibrosis in the peripheral right upper lobe, postoperative findings of left upper lobe wedge resection, no chest wall mass 6. Enterococcus urinary tract infection  09/04/2013 7. Esophagitis,? Secondary to Fosamax, improved with Protonix 8. CT of the chest 06/12/2014 with an indeterminate right lung nodule  Progressive enlargement of a sub-solid right upper lobe nodule noted on the CT 09/28/2014 9. Duodenal polyp, status post a biopsy confirming a tubular adenoma on an endoscopy 06/22/2014  Removal of a duodenal polyp 09/16/2014-pathology revealed a duodenal adenoma, no high-grade dysplasia or malignancy identified      Disposition: Leslie Petersen remains in remission from lung cancer and adrenocortical carcinoma.  She will return for an office visit and chest CT in 1 year.  She has received the COVID-19 vaccine.  Betsy Coder, MD  05/18/2020  3:48 PM

## 2020-05-19 ENCOUNTER — Telehealth: Payer: Self-pay | Admitting: Oncology

## 2020-05-19 NOTE — Telephone Encounter (Signed)
Scheduled appointment per 12/2 los. Mailed updated calendar to patient.

## 2020-06-12 ENCOUNTER — Telehealth: Payer: Self-pay | Admitting: Oncology

## 2020-06-12 NOTE — Telephone Encounter (Signed)
Spoke to patient regarding upcoming appointment per 12/27 schedule message. Patient requested a call closer to appointment date to state where the appointment would be located if the location changed.

## 2020-06-22 DIAGNOSIS — Z8781 Personal history of (healed) traumatic fracture: Secondary | ICD-10-CM | POA: Diagnosis not present

## 2020-06-22 DIAGNOSIS — M810 Age-related osteoporosis without current pathological fracture: Secondary | ICD-10-CM | POA: Diagnosis not present

## 2020-06-22 DIAGNOSIS — E2740 Unspecified adrenocortical insufficiency: Secondary | ICD-10-CM | POA: Diagnosis not present

## 2020-06-29 DIAGNOSIS — H53433 Sector or arcuate defects, bilateral: Secondary | ICD-10-CM | POA: Diagnosis not present

## 2020-06-29 DIAGNOSIS — H401113 Primary open-angle glaucoma, right eye, severe stage: Secondary | ICD-10-CM | POA: Diagnosis not present

## 2020-06-29 DIAGNOSIS — H401123 Primary open-angle glaucoma, left eye, severe stage: Secondary | ICD-10-CM | POA: Diagnosis not present

## 2020-06-29 DIAGNOSIS — H35373 Puckering of macula, bilateral: Secondary | ICD-10-CM | POA: Diagnosis not present

## 2020-10-27 DIAGNOSIS — H401123 Primary open-angle glaucoma, left eye, severe stage: Secondary | ICD-10-CM | POA: Diagnosis not present

## 2020-10-27 DIAGNOSIS — H53433 Sector or arcuate defects, bilateral: Secondary | ICD-10-CM | POA: Diagnosis not present

## 2020-10-27 DIAGNOSIS — H401113 Primary open-angle glaucoma, right eye, severe stage: Secondary | ICD-10-CM | POA: Diagnosis not present

## 2020-12-22 DIAGNOSIS — M810 Age-related osteoporosis without current pathological fracture: Secondary | ICD-10-CM | POA: Diagnosis not present

## 2020-12-22 DIAGNOSIS — E2740 Unspecified adrenocortical insufficiency: Secondary | ICD-10-CM | POA: Diagnosis not present

## 2020-12-22 DIAGNOSIS — Z8781 Personal history of (healed) traumatic fracture: Secondary | ICD-10-CM | POA: Diagnosis not present

## 2021-03-05 DIAGNOSIS — H401123 Primary open-angle glaucoma, left eye, severe stage: Secondary | ICD-10-CM | POA: Diagnosis not present

## 2021-03-05 DIAGNOSIS — H401113 Primary open-angle glaucoma, right eye, severe stage: Secondary | ICD-10-CM | POA: Diagnosis not present

## 2021-03-05 DIAGNOSIS — H04123 Dry eye syndrome of bilateral lacrimal glands: Secondary | ICD-10-CM | POA: Diagnosis not present

## 2021-03-05 DIAGNOSIS — H35373 Puckering of macula, bilateral: Secondary | ICD-10-CM | POA: Diagnosis not present

## 2021-03-16 ENCOUNTER — Emergency Department (HOSPITAL_COMMUNITY): Payer: Medicare Other

## 2021-03-16 ENCOUNTER — Inpatient Hospital Stay (HOSPITAL_COMMUNITY)
Admission: EM | Admit: 2021-03-16 | Discharge: 2021-04-17 | DRG: 981 | Disposition: E | Payer: Medicare Other | Attending: Neurology | Admitting: Neurology

## 2021-03-16 ENCOUNTER — Encounter (HOSPITAL_COMMUNITY): Payer: Self-pay

## 2021-03-16 DIAGNOSIS — R29810 Facial weakness: Secondary | ICD-10-CM | POA: Diagnosis not present

## 2021-03-16 DIAGNOSIS — I248 Other forms of acute ischemic heart disease: Secondary | ICD-10-CM | POA: Diagnosis not present

## 2021-03-16 DIAGNOSIS — H518 Other specified disorders of binocular movement: Secondary | ICD-10-CM | POA: Diagnosis not present

## 2021-03-16 DIAGNOSIS — I6603 Occlusion and stenosis of bilateral middle cerebral arteries: Secondary | ICD-10-CM | POA: Diagnosis not present

## 2021-03-16 DIAGNOSIS — G8929 Other chronic pain: Secondary | ICD-10-CM | POA: Diagnosis not present

## 2021-03-16 DIAGNOSIS — Z515 Encounter for palliative care: Secondary | ICD-10-CM

## 2021-03-16 DIAGNOSIS — N390 Urinary tract infection, site not specified: Secondary | ICD-10-CM | POA: Diagnosis not present

## 2021-03-16 DIAGNOSIS — E860 Dehydration: Secondary | ICD-10-CM | POA: Diagnosis not present

## 2021-03-16 DIAGNOSIS — R54 Age-related physical debility: Secondary | ICD-10-CM | POA: Diagnosis present

## 2021-03-16 DIAGNOSIS — Z85858 Personal history of malignant neoplasm of other endocrine glands: Secondary | ICD-10-CM

## 2021-03-16 DIAGNOSIS — M549 Dorsalgia, unspecified: Secondary | ICD-10-CM | POA: Diagnosis not present

## 2021-03-16 DIAGNOSIS — E2740 Unspecified adrenocortical insufficiency: Secondary | ICD-10-CM | POA: Diagnosis not present

## 2021-03-16 DIAGNOSIS — I959 Hypotension, unspecified: Secondary | ICD-10-CM | POA: Diagnosis not present

## 2021-03-16 DIAGNOSIS — N200 Calculus of kidney: Secondary | ICD-10-CM | POA: Diagnosis not present

## 2021-03-16 DIAGNOSIS — C3490 Malignant neoplasm of unspecified part of unspecified bronchus or lung: Secondary | ICD-10-CM | POA: Diagnosis not present

## 2021-03-16 DIAGNOSIS — D649 Anemia, unspecified: Secondary | ICD-10-CM | POA: Diagnosis present

## 2021-03-16 DIAGNOSIS — R109 Unspecified abdominal pain: Secondary | ICD-10-CM | POA: Diagnosis not present

## 2021-03-16 DIAGNOSIS — J189 Pneumonia, unspecified organism: Secondary | ICD-10-CM | POA: Diagnosis present

## 2021-03-16 DIAGNOSIS — I6503 Occlusion and stenosis of bilateral vertebral arteries: Secondary | ICD-10-CM | POA: Diagnosis not present

## 2021-03-16 DIAGNOSIS — A419 Sepsis, unspecified organism: Secondary | ICD-10-CM | POA: Diagnosis not present

## 2021-03-16 DIAGNOSIS — N949 Unspecified condition associated with female genital organs and menstrual cycle: Secondary | ICD-10-CM | POA: Diagnosis not present

## 2021-03-16 DIAGNOSIS — Z9842 Cataract extraction status, left eye: Secondary | ICD-10-CM

## 2021-03-16 DIAGNOSIS — C3412 Malignant neoplasm of upper lobe, left bronchus or lung: Secondary | ICD-10-CM | POA: Diagnosis present

## 2021-03-16 DIAGNOSIS — Z87891 Personal history of nicotine dependence: Secondary | ICD-10-CM

## 2021-03-16 DIAGNOSIS — G8194 Hemiplegia, unspecified affecting left nondominant side: Secondary | ICD-10-CM | POA: Diagnosis not present

## 2021-03-16 DIAGNOSIS — D630 Anemia in neoplastic disease: Secondary | ICD-10-CM | POA: Diagnosis present

## 2021-03-16 DIAGNOSIS — I5030 Unspecified diastolic (congestive) heart failure: Secondary | ICD-10-CM | POA: Diagnosis present

## 2021-03-16 DIAGNOSIS — G629 Polyneuropathy, unspecified: Secondary | ICD-10-CM | POA: Diagnosis present

## 2021-03-16 DIAGNOSIS — D6959 Other secondary thrombocytopenia: Secondary | ICD-10-CM | POA: Diagnosis not present

## 2021-03-16 DIAGNOSIS — I63511 Cerebral infarction due to unspecified occlusion or stenosis of right middle cerebral artery: Secondary | ICD-10-CM | POA: Diagnosis not present

## 2021-03-16 DIAGNOSIS — Z743 Need for continuous supervision: Secondary | ICD-10-CM | POA: Diagnosis not present

## 2021-03-16 DIAGNOSIS — I2510 Atherosclerotic heart disease of native coronary artery without angina pectoris: Secondary | ICD-10-CM | POA: Diagnosis not present

## 2021-03-16 DIAGNOSIS — Z66 Do not resuscitate: Secondary | ICD-10-CM | POA: Diagnosis present

## 2021-03-16 DIAGNOSIS — R262 Difficulty in walking, not elsewhere classified: Secondary | ICD-10-CM | POA: Diagnosis not present

## 2021-03-16 DIAGNOSIS — E876 Hypokalemia: Secondary | ICD-10-CM | POA: Diagnosis not present

## 2021-03-16 DIAGNOSIS — K449 Diaphragmatic hernia without obstruction or gangrene: Secondary | ICD-10-CM | POA: Diagnosis not present

## 2021-03-16 DIAGNOSIS — M810 Age-related osteoporosis without current pathological fracture: Secondary | ICD-10-CM | POA: Diagnosis present

## 2021-03-16 DIAGNOSIS — J9601 Acute respiratory failure with hypoxia: Secondary | ICD-10-CM | POA: Diagnosis not present

## 2021-03-16 DIAGNOSIS — M545 Low back pain, unspecified: Secondary | ICD-10-CM | POA: Diagnosis present

## 2021-03-16 DIAGNOSIS — R0689 Other abnormalities of breathing: Secondary | ICD-10-CM | POA: Diagnosis not present

## 2021-03-16 DIAGNOSIS — R29729 NIHSS score 29: Secondary | ICD-10-CM | POA: Diagnosis present

## 2021-03-16 DIAGNOSIS — K219 Gastro-esophageal reflux disease without esophagitis: Secondary | ICD-10-CM | POA: Diagnosis present

## 2021-03-16 DIAGNOSIS — E785 Hyperlipidemia, unspecified: Secondary | ICD-10-CM | POA: Diagnosis present

## 2021-03-16 DIAGNOSIS — I110 Hypertensive heart disease with heart failure: Secondary | ICD-10-CM | POA: Diagnosis present

## 2021-03-16 DIAGNOSIS — Z888 Allergy status to other drugs, medicaments and biological substances status: Secondary | ICD-10-CM

## 2021-03-16 DIAGNOSIS — I480 Paroxysmal atrial fibrillation: Secondary | ICD-10-CM | POA: Diagnosis not present

## 2021-03-16 DIAGNOSIS — I214 Non-ST elevation (NSTEMI) myocardial infarction: Secondary | ICD-10-CM | POA: Diagnosis present

## 2021-03-16 DIAGNOSIS — C7400 Malignant neoplasm of cortex of unspecified adrenal gland: Secondary | ICD-10-CM | POA: Diagnosis not present

## 2021-03-16 DIAGNOSIS — I639 Cerebral infarction, unspecified: Secondary | ICD-10-CM | POA: Diagnosis not present

## 2021-03-16 DIAGNOSIS — R079 Chest pain, unspecified: Secondary | ICD-10-CM

## 2021-03-16 DIAGNOSIS — I6523 Occlusion and stenosis of bilateral carotid arteries: Secondary | ICD-10-CM | POA: Diagnosis not present

## 2021-03-16 DIAGNOSIS — Z8249 Family history of ischemic heart disease and other diseases of the circulatory system: Secondary | ICD-10-CM

## 2021-03-16 DIAGNOSIS — R509 Fever, unspecified: Secondary | ICD-10-CM | POA: Diagnosis not present

## 2021-03-16 DIAGNOSIS — R6889 Other general symptoms and signs: Secondary | ICD-10-CM | POA: Diagnosis not present

## 2021-03-16 DIAGNOSIS — E162 Hypoglycemia, unspecified: Secondary | ICD-10-CM | POA: Diagnosis not present

## 2021-03-16 DIAGNOSIS — I4891 Unspecified atrial fibrillation: Secondary | ICD-10-CM | POA: Diagnosis present

## 2021-03-16 DIAGNOSIS — R531 Weakness: Secondary | ICD-10-CM | POA: Diagnosis not present

## 2021-03-16 DIAGNOSIS — I700 Atherosclerosis of aorta: Secondary | ICD-10-CM | POA: Diagnosis present

## 2021-03-16 DIAGNOSIS — R069 Unspecified abnormalities of breathing: Secondary | ICD-10-CM

## 2021-03-16 DIAGNOSIS — R1310 Dysphagia, unspecified: Secondary | ICD-10-CM | POA: Diagnosis not present

## 2021-03-16 DIAGNOSIS — E872 Acidosis, unspecified: Secondary | ICD-10-CM | POA: Diagnosis not present

## 2021-03-16 DIAGNOSIS — K439 Ventral hernia without obstruction or gangrene: Secondary | ICD-10-CM | POA: Diagnosis not present

## 2021-03-16 DIAGNOSIS — R918 Other nonspecific abnormal finding of lung field: Secondary | ICD-10-CM | POA: Diagnosis not present

## 2021-03-16 DIAGNOSIS — I083 Combined rheumatic disorders of mitral, aortic and tricuspid valves: Secondary | ICD-10-CM | POA: Diagnosis present

## 2021-03-16 DIAGNOSIS — R0603 Acute respiratory distress: Secondary | ICD-10-CM | POA: Diagnosis not present

## 2021-03-16 DIAGNOSIS — Z4682 Encounter for fitting and adjustment of non-vascular catheter: Secondary | ICD-10-CM | POA: Diagnosis not present

## 2021-03-16 DIAGNOSIS — Z79899 Other long term (current) drug therapy: Secondary | ICD-10-CM

## 2021-03-16 DIAGNOSIS — R414 Neurologic neglect syndrome: Secondary | ICD-10-CM | POA: Diagnosis not present

## 2021-03-16 DIAGNOSIS — R451 Restlessness and agitation: Secondary | ICD-10-CM | POA: Diagnosis not present

## 2021-03-16 DIAGNOSIS — I63411 Cerebral infarction due to embolism of right middle cerebral artery: Secondary | ICD-10-CM | POA: Diagnosis not present

## 2021-03-16 DIAGNOSIS — Z86010 Personal history of colonic polyps: Secondary | ICD-10-CM

## 2021-03-16 DIAGNOSIS — Z923 Personal history of irradiation: Secondary | ICD-10-CM

## 2021-03-16 DIAGNOSIS — Z9841 Cataract extraction status, right eye: Secondary | ICD-10-CM

## 2021-03-16 DIAGNOSIS — Z20822 Contact with and (suspected) exposure to covid-19: Secondary | ICD-10-CM | POA: Diagnosis present

## 2021-03-16 DIAGNOSIS — I6601 Occlusion and stenosis of right middle cerebral artery: Secondary | ICD-10-CM | POA: Diagnosis present

## 2021-03-16 DIAGNOSIS — R471 Dysarthria and anarthria: Secondary | ICD-10-CM | POA: Diagnosis present

## 2021-03-16 DIAGNOSIS — Z7189 Other specified counseling: Secondary | ICD-10-CM | POA: Diagnosis not present

## 2021-03-16 LAB — COMPREHENSIVE METABOLIC PANEL
ALT: 21 U/L (ref 0–44)
AST: 36 U/L (ref 15–41)
Albumin: 2.9 g/dL — ABNORMAL LOW (ref 3.5–5.0)
Alkaline Phosphatase: 48 U/L (ref 38–126)
Anion gap: 9 (ref 5–15)
BUN: 15 mg/dL (ref 8–23)
CO2: 26 mmol/L (ref 22–32)
Calcium: 8.7 mg/dL — ABNORMAL LOW (ref 8.9–10.3)
Chloride: 110 mmol/L (ref 98–111)
Creatinine, Ser: 0.74 mg/dL (ref 0.44–1.00)
GFR, Estimated: >60 mL/min (ref >60)
Glucose, Bld: 115 mg/dL — ABNORMAL HIGH (ref 70–99)
Potassium: 3.2 mmol/L — ABNORMAL LOW (ref 3.5–5.1)
Sodium: 145 mmol/L (ref 135–145)
Total Bilirubin: 1.2 mg/dL (ref 0.3–1.2)
Total Protein: 6.2 g/dL — ABNORMAL LOW (ref 6.5–8.1)

## 2021-03-16 LAB — CBC WITH DIFFERENTIAL/PLATELET
Abs Immature Granulocytes: 0.04 10*3/uL (ref 0.00–0.07)
Basophils Absolute: 0.1 10*3/uL (ref 0.0–0.1)
Basophils Relative: 1 %
Eosinophils Absolute: 0.1 10*3/uL (ref 0.0–0.5)
Eosinophils Relative: 1 %
HCT: 32.7 % — ABNORMAL LOW (ref 36.0–46.0)
Hemoglobin: 10.6 g/dL — ABNORMAL LOW (ref 12.0–15.0)
Immature Granulocytes: 1 %
Lymphocytes Relative: 24 %
Lymphs Abs: 2.1 10*3/uL (ref 0.7–4.0)
MCH: 36.3 pg — ABNORMAL HIGH (ref 26.0–34.0)
MCHC: 32.4 g/dL (ref 30.0–36.0)
MCV: 112 fL — ABNORMAL HIGH (ref 80.0–100.0)
Monocytes Absolute: 1.3 10*3/uL — ABNORMAL HIGH (ref 0.1–1.0)
Monocytes Relative: 15 %
Neutro Abs: 4.9 10*3/uL (ref 1.7–7.7)
Neutrophils Relative %: 58 %
Platelets: 147 10*3/uL — ABNORMAL LOW (ref 150–400)
RBC: 2.92 MIL/uL — ABNORMAL LOW (ref 3.87–5.11)
RDW: 13.2 % (ref 11.5–15.5)
WBC: 8.4 10*3/uL (ref 4.0–10.5)
nRBC: 0.9 % — ABNORMAL HIGH (ref 0.0–0.2)

## 2021-03-16 LAB — APTT: aPTT: 35 seconds (ref 24–36)

## 2021-03-16 LAB — IRON AND TIBC
Iron: 31 ug/dL (ref 28–170)
Saturation Ratios: 18 % (ref 10.4–31.8)
TIBC: 169 ug/dL — ABNORMAL LOW (ref 250–450)
UIBC: 138 ug/dL

## 2021-03-16 LAB — RETICULOCYTES
Immature Retic Fract: 20.3 % — ABNORMAL HIGH (ref 2.3–15.9)
RBC.: 2.74 MIL/uL — ABNORMAL LOW (ref 3.87–5.11)
Retic Count, Absolute: 42.2 10*3/uL (ref 19.0–186.0)
Retic Ct Pct: 1.5 % (ref 0.4–3.1)

## 2021-03-16 LAB — LACTIC ACID, PLASMA
Lactic Acid, Venous: 2.4 mmol/L (ref 0.5–1.9)
Lactic Acid, Venous: 1.3 mmol/L (ref 0.5–1.9)

## 2021-03-16 LAB — TROPONIN I (HIGH SENSITIVITY)
Troponin I (High Sensitivity): 3493 ng/L (ref <18)
Troponin I (High Sensitivity): 3035 ng/L (ref <18)

## 2021-03-16 LAB — VITAMIN B12: Vitamin B-12: 2239 pg/mL — ABNORMAL HIGH (ref 180–914)

## 2021-03-16 LAB — RESP PANEL BY RT-PCR (FLU A&B, COVID) ARPGX2
Influenza A by PCR: NEGATIVE
Influenza B by PCR: NEGATIVE
SARS Coronavirus 2 by RT PCR: NEGATIVE

## 2021-03-16 LAB — FERRITIN: Ferritin: 380 ng/mL — ABNORMAL HIGH (ref 11–307)

## 2021-03-16 LAB — PROTIME-INR
INR: 1.3 — ABNORMAL HIGH (ref 0.8–1.2)
Prothrombin Time: 16.2 seconds — ABNORMAL HIGH (ref 11.4–15.2)

## 2021-03-16 LAB — TSH: TSH: 2.628 u[IU]/mL (ref 0.350–4.500)

## 2021-03-16 LAB — FOLATE: Folate: 26.9 ng/mL (ref >5.9)

## 2021-03-16 LAB — MAGNESIUM: Magnesium: 1.7 mg/dL (ref 1.7–2.4)

## 2021-03-16 LAB — PROCALCITONIN: Procalcitonin: <0.1 ng/mL

## 2021-03-16 LAB — MRSA NEXT GEN BY PCR, NASAL: MRSA by PCR Next Gen: NOT DETECTED

## 2021-03-16 MED ORDER — LACTATED RINGERS IV BOLUS (SEPSIS)
1000.0000 mL | Freq: Once | INTRAVENOUS | Status: AC
  Administered 2021-03-16: 1000 mL via INTRAVENOUS

## 2021-03-16 MED ORDER — POTASSIUM CHLORIDE IN NACL 40-0.9 MEQ/L-% IV SOLN
INTRAVENOUS | Status: DC
  Administered 2021-03-16 (×2): 100 mL/h via INTRAVENOUS
  Filled 2021-03-16 (×2): qty 1000

## 2021-03-16 MED ORDER — GABAPENTIN 300 MG PO CAPS: 300.0000 mg | ORAL_CAPSULE | Freq: Every day | ORAL | Status: DC | PRN

## 2021-03-16 MED ORDER — LATANOPROST 0.005 % OP SOLN
1.0000 [drp] | Freq: Every day | OPHTHALMIC | Status: DC
  Administered 2021-03-16 – 2021-03-20 (×5): 1 [drp] via OPHTHALMIC
  Filled 2021-03-16 (×2): qty 2.5

## 2021-03-16 MED ORDER — ACETAMINOPHEN 650 MG RE SUPP: 650.0000 mg | Freq: Four times a day (QID) | RECTAL | Status: DC | PRN

## 2021-03-16 MED ORDER — SODIUM CHLORIDE 0.9% FLUSH
3.0000 mL | Freq: Two times a day (BID) | INTRAVENOUS | Status: DC
  Administered 2021-03-16 – 2021-03-19 (×6): 3 mL via INTRAVENOUS

## 2021-03-16 MED ORDER — POTASSIUM CHLORIDE CRYS ER 20 MEQ PO TBCR
20.0000 meq | EXTENDED_RELEASE_TABLET | Freq: Once | ORAL | Status: AC
  Administered 2021-03-16: 20 meq via ORAL
  Filled 2021-03-16: qty 1

## 2021-03-16 MED ORDER — FENTANYL CITRATE PF 50 MCG/ML IJ SOSY
50.0000 ug | PREFILLED_SYRINGE | Freq: Once | INTRAMUSCULAR | Status: AC
  Administered 2021-03-16: 50 ug via INTRAVENOUS
  Filled 2021-03-16: qty 1

## 2021-03-16 MED ORDER — HYDROCORTISONE 5 MG PO TABS
5.0000 mg | ORAL_TABLET | ORAL | Status: DC
  Administered 2021-03-17 – 2021-03-19 (×3): 5 mg via ORAL
  Filled 2021-03-16 (×4): qty 1

## 2021-03-16 MED ORDER — MAGNESIUM SULFATE 2 GM/50ML IV SOLN
2.0000 g | Freq: Once | INTRAVENOUS | Status: AC
  Administered 2021-03-16: 2 g via INTRAVENOUS
  Filled 2021-03-16: qty 50

## 2021-03-16 MED ORDER — LEVALBUTEROL HCL 0.63 MG/3ML IN NEBU
0.6300 mg | INHALATION_SOLUTION | Freq: Four times a day (QID) | RESPIRATORY_TRACT | Status: DC
  Administered 2021-03-16: 0.63 mg via RESPIRATORY_TRACT
  Filled 2021-03-16: qty 3

## 2021-03-16 MED ORDER — SODIUM CHLORIDE 0.9 % IV SOLN
1.0000 g | Freq: Once | INTRAVENOUS | Status: AC
  Administered 2021-03-16: 1 g via INTRAVENOUS
  Filled 2021-03-16: qty 10

## 2021-03-16 MED ORDER — ASPIRIN EC 81 MG PO TBEC
81.0000 mg | DELAYED_RELEASE_TABLET | Freq: Every day | ORAL | Status: DC
  Administered 2021-03-16 – 2021-03-19 (×4): 81 mg via ORAL
  Filled 2021-03-16 (×4): qty 1

## 2021-03-16 MED ORDER — ONDANSETRON HCL 4 MG/2ML IJ SOLN
4.0000 mg | Freq: Four times a day (QID) | INTRAMUSCULAR | Status: DC | PRN
  Administered 2021-03-18: 4 mg via INTRAVENOUS
  Filled 2021-03-16: qty 2

## 2021-03-16 MED ORDER — PIPERACILLIN-TAZOBACTAM 3.375 G IVPB
3.3750 g | Freq: Three times a day (TID) | INTRAVENOUS | Status: DC
  Administered 2021-03-16 – 2021-03-19 (×8): 3.375 g via INTRAVENOUS
  Filled 2021-03-16 (×8): qty 50

## 2021-03-16 MED ORDER — IPRATROPIUM BROMIDE 0.02 % IN SOLN
0.5000 mg | Freq: Four times a day (QID) | RESPIRATORY_TRACT | Status: DC
  Administered 2021-03-16: 0.5 mg via RESPIRATORY_TRACT
  Filled 2021-03-16: qty 2.5

## 2021-03-16 MED ORDER — LEVALBUTEROL HCL 0.63 MG/3ML IN NEBU
0.6300 mg | INHALATION_SOLUTION | Freq: Two times a day (BID) | RESPIRATORY_TRACT | Status: DC
  Administered 2021-03-17 – 2021-03-18 (×4): 0.63 mg via RESPIRATORY_TRACT
  Filled 2021-03-16 (×4): qty 3

## 2021-03-16 MED ORDER — HYDROCORTISONE 10 MG PO TABS
10.0000 mg | ORAL_TABLET | ORAL | Status: DC
  Administered 2021-03-17 – 2021-03-20 (×4): 10 mg via ORAL
  Filled 2021-03-16: qty 1
  Filled 2021-03-16 (×3): qty 2

## 2021-03-16 MED ORDER — FLUDROCORTISONE ACETATE 0.1 MG PO TABS
0.1000 mg | ORAL_TABLET | ORAL | Status: DC
  Administered 2021-03-17 – 2021-03-20 (×4): 0.1 mg via ORAL
  Filled 2021-03-16 (×4): qty 1

## 2021-03-16 MED ORDER — CHLORHEXIDINE GLUCONATE CLOTH 2 % EX PADS
6.0000 | MEDICATED_PAD | Freq: Every day | CUTANEOUS | Status: DC
  Administered 2021-03-16 – 2021-03-20 (×5): 6 via TOPICAL

## 2021-03-16 MED ORDER — SODIUM CHLORIDE 0.9 % IV BOLUS
1000.0000 mL | Freq: Once | INTRAVENOUS | Status: AC
  Administered 2021-03-16: 1000 mL via INTRAVENOUS

## 2021-03-16 MED ORDER — ONDANSETRON HCL 4 MG PO TABS: 4.0000 mg | ORAL_TABLET | Freq: Four times a day (QID) | ORAL | Status: DC | PRN

## 2021-03-16 MED ORDER — PANTOPRAZOLE 2 MG/ML SUSPENSION
40.0000 mg | Freq: Every day | ORAL | Status: DC
  Administered 2021-03-16: 40 mg via ORAL
  Filled 2021-03-16 (×2): qty 20

## 2021-03-16 MED ORDER — IPRATROPIUM BROMIDE 0.02 % IN SOLN
0.5000 mg | Freq: Two times a day (BID) | RESPIRATORY_TRACT | Status: DC
  Administered 2021-03-17 – 2021-03-18 (×4): 0.5 mg via RESPIRATORY_TRACT
  Filled 2021-03-16 (×4): qty 2.5

## 2021-03-16 MED ORDER — POLYETHYLENE GLYCOL 3350 17 G PO PACK
17.0000 g | PACK | Freq: Every day | ORAL | Status: DC | PRN
  Administered 2021-03-17: 17 g via ORAL
  Filled 2021-03-16: qty 1

## 2021-03-16 MED ORDER — GUAIFENESIN ER 600 MG PO TB12
1200.0000 mg | ORAL_TABLET | Freq: Two times a day (BID) | ORAL | Status: DC
  Administered 2021-03-16 – 2021-03-19 (×7): 1200 mg via ORAL
  Filled 2021-03-16 (×9): qty 2

## 2021-03-16 MED ORDER — ACETAMINOPHEN 325 MG PO TABS
650.0000 mg | ORAL_TABLET | Freq: Four times a day (QID) | ORAL | Status: DC | PRN
  Administered 2021-03-17 – 2021-03-20 (×6): 650 mg via ORAL
  Filled 2021-03-16 (×6): qty 2

## 2021-03-16 MED ORDER — DILTIAZEM LOAD VIA INFUSION
10.0000 mg | Freq: Once | INTRAVENOUS | Status: AC
  Administered 2021-03-16: 10 mg via INTRAVENOUS
  Filled 2021-03-16 (×2): qty 10

## 2021-03-16 MED ORDER — GABAPENTIN 300 MG PO CAPS
300.0000 mg | ORAL_CAPSULE | Freq: Every day | ORAL | Status: DC
  Administered 2021-03-16 – 2021-03-19 (×4): 300 mg via ORAL
  Filled 2021-03-16 (×4): qty 1

## 2021-03-16 MED ORDER — SORBITOL 70 % SOLN
30.0000 mL | Freq: Every day | Status: DC | PRN
  Administered 2021-03-17: 30 mL via ORAL
  Filled 2021-03-16 (×2): qty 30

## 2021-03-16 MED ORDER — KETOROLAC TROMETHAMINE 15 MG/ML IJ SOLN
15.0000 mg | Freq: Once | INTRAMUSCULAR | Status: AC
  Administered 2021-03-16: 15 mg via INTRAVENOUS
  Filled 2021-03-16: qty 1

## 2021-03-16 MED ORDER — DILTIAZEM HCL-DEXTROSE 125-5 MG/125ML-% IV SOLN (PREMIX)
5.0000 mg/h | INTRAVENOUS | Status: DC
  Administered 2021-03-16 (×2): 5 mg/h via INTRAVENOUS
  Filled 2021-03-16: qty 125

## 2021-03-16 MED ORDER — AZITHROMYCIN 250 MG PO TABS
500.0000 mg | ORAL_TABLET | Freq: Once | ORAL | Status: AC
  Administered 2021-03-16: 500 mg via ORAL
  Filled 2021-03-16: qty 2

## 2021-03-16 MED ORDER — HYDROCORTISONE 5 MG PO TABS
5.0000 mg | ORAL_TABLET | Freq: Once | ORAL | Status: AC
  Administered 2021-03-16: 5 mg via ORAL
  Filled 2021-03-16: qty 1

## 2021-03-16 NOTE — ED Triage Notes (Signed)
Per EMS, patient from home, worsening weakness x1 week with R flank pain. Ambulates with walker at baseline. Difficulty ambulating with decreased PO intake this week. Daughter lives upstairs. Covid booster and flu shot yesterday.  T 104 HR 120 BP 128/62 CBG 85  1000mg  Tylenol with EMS  22g R hand 25ml NS

## 2021-03-16 NOTE — ED Provider Notes (Signed)
[place] [facility] DEPT Provider Note   CSN: [id number] Arrival date & time: 02/24/2021  1328     History Chief Complaint  Patient presents with   Weakness   Flank Pain    Leslie Petersen is a 86 y.o. female. on Accepted handoff at shift change from [facility]. Please see prior provider note for more detail.   Briefly: Patient is 86 y.o. with past medical history significant for lung cancer, adrenal cancer, with some metastasis who presents with generalized weakness, right flank pain some difficulty urinating for the last week.  DDX: concern for pneumonia versus UTI, unclear history prior to arrival of daughter, patient is not the best historian.  Patient does endorse some shortness of breath, generalized weakness, back pain versus flank pain unclear and some difficulty urinating.  Patient denies pain with urination, just reports that it is difficult for her to urinate full bladder, or feels that she has not completely emptied her bladder.  Plan: As Below   Weakness Associated symptoms: shortness of breath   Flank Pain Associated symptoms include shortness of breath.      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   Esophageal polyp    GERD (gastroesophageal reflux disease)    Glaucoma    Hard of hearing    History of hiatal hernia    Lung cancer, upper lobe, left 2011   adenocarcinoma resected    Metastasis to adrenal gland ([place]) dx'd 1994, 2011   1994 left, 2011 right, back tumor 2015   Osteoporosis    S/P radiation therapy 9/26-16-03/20/15   SBRT LUL 54 Gy/80fx   Urinary frequency    Wears glasses     Patient Active Problem List   Diagnosis Date Noted   Sepsis ([place]) 03/04/2021   New onset atrial fibrillation ([place]) 02/17/2021   Chronic back pain  03/09/2021   Generalized weakness 03/14/2021   CAP (community acquired pneumonia) 02/20/2021   Dehydration 02/15/2021   Hypokalemia 03/10/2021   Anemia 02/25/2021   Adnexal cyst 02/17/2021   Primary cancer of left upper lobe of lung ([place]) 03/29/2015   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   Osteoporosis 02/17/2014   Atypical chest pain 02/17/2014   Hypotension 02/17/2014   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   Adenocarcinoma, lung ([place]) 02/17/2014   Chest pain 02/17/2014   Personal history adenomatous of colonic polyps 10/24/2011    Past Surgical History:  Procedure Laterality Date   ADRENAL GLAND SURGERY  1990/2011   CATARACT EXTRACTION, BILATERAL Bilateral    COLONOSCOPY  multiple   ESOPHAGOGASTRODUODENOSCOPY     ESOPHAGOGASTRODUODENOSCOPY N/A 09/16/2014   Procedure: ESOPHAGOGASTRODUODENOSCOPY (EGD);  Surgeon: [name], MD;  Location: [facility];  Service: Endoscopy;  Laterality: N/A;   EYE SURGERY     HOT HEMOSTASIS N/A 09/16/2014   Procedure: HOT HEMOSTASIS (ARGON PLASMA COAGULATION/BICAP);  Surgeon: [name], MD;  Location: [facility];  Service: Endoscopy;  Laterality: N/A;   LUNG CANCER SURGERY  08/2009   malignant tumor from back  June 2015   at [facility] "related to adrenal gland cancer" per pt   TUBAL LIGATION     VIDEO BRONCHOSCOPY WITH ENDOBRONCHIAL NAVIGATION N/A 02/06/2015   Procedure: VIDEO BRONCHOSCOPY WITH ENDOBRONCHIAL NAVIGATION;  Surgeon: Grace Isaac, MD;  Location: [place];  Service: Thoracic;  Laterality: N/A;   VIDEO BRONCHOSCOPY WITH ENDOBRONCHIAL ULTRASOUND N/A 10/18/2014   Procedure: VIDEO BRONCHOSCOPY WITH ENDOBRONCHIAL ULTRASOUND;  Surgeon: Grace Isaac, MD;  Location: [place];  Service: Thoracic;  Laterality: N/A;   VIDEO BRONCHOSCOPY WITH ENDOBRONCHIAL ULTRASOUND N/A 02/06/2015   Procedure: VIDEO BRONCHOSCOPY WITH ENDOBRONCHIAL ULTRASOUND;  Surgeon: Grace Isaac, MD;  Location: [place];  Service: Thoracic;  Laterality: N/A;     OB  History   No obstetric history on file.     Family History  Problem Relation Age of Onset   Heart attack Mother    Heart attack Father     Social History   Tobacco Use   Smoking status: Former    Types: Cigarettes    Quit date: 02/17/1989    Years since quitting: 32.0   Smokeless tobacco: Never  Substance Use Topics   Alcohol use: No   Drug use: No    Home Medications Prior to Admission medications   Medication Sig Start Date End Date Taking? Authorizing Provider  atorvastatin (LIPITOR) 10 MG tablet Take 10 mg by mouth at bedtime. 04/27/17  Yes [provider]  Cholecalciferol (VITAMIN D3) 1000 UNITS CAPS Take 1,000 Units by mouth daily.    Yes [provider]  fludrocortisone (FLORINEF) 0.1 MG tablet Take 0.1 mg by mouth daily. At 8:00 am 09/12/11  Yes [provider]  gabapentin (NEURONTIN) 300 MG capsule Take one pill tid Patient taking differently: Take 300 mg by mouth as directed. Take 1 capsule (300 mg) at bedtime and Take 1 capsule (300 mg) Daily PRN Neuropathy 08/22/16  Yes Milton Ferguson, MD  hydrocortisone (CORTEF) 10 MG tablet Take 5-10 mg by mouth as directed. Take 10 mg tablet by mouth at 8:00 am 5 mg tablet at noon 09/12/11  Yes [provider]  latanoprost (XALATAN) 0.005 % ophthalmic solution Place 1 drop into both eyes at bedtime. 04/09/20  Yes [provider]  Multiple Vitamins-Minerals (MULTIVITAMIN WITH MINERALS) tablet Take 1 tablet by mouth daily after lunch.    Yes [provider]  olmesartan (BENICAR) 40 MG tablet Take 40 mg by mouth daily. 04/19/20  Yes [provider]  Pseudoephedrine HCl (SINUS 12 HOUR PO) Take 1 tablet by mouth daily.   Yes [provider]    Allergies    Mirabegron and Urabeth [bethanechol]  Review of Systems   Review of Systems  Respiratory:  Positive for shortness of breath.   Genitourinary:  Positive for flank pain.  Neurological:  Positive for weakness.   All other systems reviewed and are negative.  Physical Exam Updated Vital Signs BP 99/63   Pulse 89   Temp 98.6 F (37 C) (Oral)   Resp (!) 36   Wt 48.5 kg   SpO2 95%   BMI 18.35 kg/m   Physical Exam Vitals and nursing note reviewed.  Constitutional:      General: She is not in acute distress.    Appearance: Normal appearance.     Comments: Elderly female laying in bed in no acute distress.  HENT:     Head: Normocephalic and atraumatic.  Eyes:     General:        Right eye: No discharge.        Left eye: No discharge.  Cardiovascular:     Rate and Rhythm: Tachycardia present. Rhythm irregular.  Pulmonary:     Effort: Pulmonary effort is  normal.     Breath sounds: Normal breath sounds.     Comments: Tachypnea, without accessory muscle use.  No wheezing, or rhonchi. Abdominal:     General: Bowel sounds are normal.     Palpations: Abdomen is soft.  Musculoskeletal:     Comments: Symmetric 4 out of 5 strength in all extremities.  Favor Global weakness.  Skin:    General: Skin is warm and dry.     Capillary Refill: Capillary refill takes less than 2 seconds.  Neurological:     Mental Status: She is alert and oriented to person, place, and time.  Psychiatric:        Mood and Affect: Mood normal.        Behavior: Behavior normal.    ED Results / Procedures / Treatments   Labs (all labs ordered are listed, but only abnormal results are displayed) Labs Reviewed  LACTIC ACID, PLASMA - Abnormal; Notable for the following components:      Result Value   Lactic Acid, Venous 2.4 (*)    All other components within normal limits  COMPREHENSIVE METABOLIC PANEL - Abnormal; Notable for the following components:   Potassium 3.2 (*)    Glucose, Bld 115 (*)    Calcium 8.7 (*)    Total Protein 6.2 (*)    Albumin 2.9 (*)    All other components within normal limits  CBC WITH DIFFERENTIAL/PLATELET - Abnormal; Notable for the following components:   RBC 2.92 (*)    Hemoglobin  10.6 (*)    HCT 32.7 (*)    MCV 112.0 (*)    MCH 36.3 (*)    Platelets 147 (*)    nRBC 0.9 (*)    Monocytes Absolute 1.3 (*)    All other components within normal limits  PROTIME-INR - Abnormal; Notable for the following components:   Prothrombin Time 16.2 (*)    INR 1.3 (*)    All other components within normal limits  RESP PANEL BY RT-PCR (FLU A&B, COVID) ARPGX2  CULTURE, BLOOD (SINGLE)  URINE CULTURE  CULTURE, BLOOD (ROUTINE X 2)  CULTURE, BLOOD (ROUTINE X 2)  MRSA NEXT GEN BY PCR, NASAL  LACTIC ACID, PLASMA  APTT  MAGNESIUM  URINALYSIS, ROUTINE W REFLEX MICROSCOPIC  HIV ANTIBODY (ROUTINE TESTING W REFLEX)  LEGIONELLA PNEUMOPHILA SEROGP 1 UR AG  STREP PNEUMONIAE URINARY ANTIGEN  LACTIC ACID, PLASMA  PROCALCITONIN  PROCALCITONIN  VITAMIN B12  FOLATE  IRON AND TIBC  FERRITIN  RETICULOCYTES  MAGNESIUM  TSH  BASIC METABOLIC PANEL  CBC WITH DIFFERENTIAL/PLATELET  TROPONIN I (HIGH SENSITIVITY)    EKG EKG Interpretation  Date/Time:  Friday March 16 2021 13:46:56 EDT Ventricular Rate:  102 PR Interval:  141 QRS Duration: 79 QT Interval:  341 QTC Calculation: 445 R Axis:   47 Text Interpretation: Sinus tachycardia No significant change since last tracing Confirmed by Calvert Cantor [phone]) on 02/24/2021 2:04:56 PM  Radiology DG Chest 1 View  Result Date: 03/05/2021 CLINICAL DATA:  Weakness and right flank pain. EXAM: CHEST  1 VIEW COMPARISON:  Chest x-ray 02/06/2015.  CT chest 05/09/2020. FINDINGS: Postsurgical changes in the left upper lobe appear stable. There is increasing right upper lobe/perihilar airspace disease. There is a band of opacity in the right upper lobe similar to the prior study. A large hiatal hernia is again seen. There are atherosclerotic calcifications of the aorta. Heart size is within normal limits. The costophrenic angles are clear. There is no pneumothorax. No acute fractures are  seen. IMPRESSION: 1. New right upper lobe/perihilar  airspace disease. Findings may be related to infection. Other etiologies such as neoplasm cannot be excluded given patient's history. 2. Stable postsurgical changes in the left lung. Electronically Signed   By: Ronney Asters M.D.   On: 03/06/2021 15:06   CT L-SPINE NO CHARGE  Result Date: 03/04/2021 CLINICAL DATA:  Back pain. Worsening weakness for 1 week with right flank pain. EXAM: CT LUMBAR SPINE WITHOUT CONTRAST TECHNIQUE: Multidetector CT imaging of the lumbar spine was performed without intravenous contrast administration. Multiplanar CT image reconstructions were also generated. COMPARISON:  Lumbar spine MRI 09/05/2012 FINDINGS: Segmentation: The lowest fully formed intervertebral disc space is L5-S1. There are no ribs at T12. Alignment: Chronic straightening of the normal lumbar lordosis. No listhesis. Vertebrae: Unchanged chronic T12 and L1 compression fractures. No acute fracture or suspicious osseous lesion. Scattered small Schmorl's nodes. Diffuse osteopenia. Paraspinal and other soft tissues: No acute abnormality identified in the paraspinal soft tissues. Intra-and pelvic contents reported separately. Disc levels: Similar appearance of diffuse lumbar disc and facet degeneration compared to the prior MRI, most notable at L4-5 where there is moderate spinal stenosis, moderate bilateral lateral recess stenosis, and moderate right neural foraminal stenosis. Unchanged moderate left greater than right neural foraminal stenosis at L5-S1. IMPRESSION: 1. No acute osseous abnormality identified in the lumbar spine. 2. Unchanged chronic T12 and L1 compression fractures. 3. Unchanged lumbar disc and facet degeneration. Electronically Signed   By: Logan Bores M.D.   On: 03/15/2021 14:43   CT Renal Stone Study  Result Date: 03/08/2021 CLINICAL DATA:  Flank pain in an 86 year old female for 1 week, difficulty ambulating. EXAM: CT ABDOMEN AND PELVIS WITHOUT CONTRAST TECHNIQUE: Multidetector CT imaging of the  abdomen and pelvis was performed following the standard protocol without IV contrast. COMPARISON:  Chest x-ray and lumbar spine evaluation of the same date, CT of the chest, abdomen and pelvis from 2016. FINDINGS: Lower chest: Large hiatal hernia extends into the chest, greater than 50% of the stomach has herniated into the chest as on previous studies. Three-vessel coronary artery disease. No substantial pericardial effusion. Pulmonary emphysema.  No pleural effusion or consolidation. Hepatobiliary: Liver with smooth contours. No pericholecystic stranding. No gross biliary duct distension. Pancreas: Pancreatic atrophy, no signs of inflammation. Spleen: Normal. Adrenals/Urinary Tract: Post bilateral adrenalectomy. Nephrolithiasis on the LEFT, small intrarenal calculi largest in the lower pole measuring approximately 4 mm. No definite ureteral calculi, no signs of hydronephrosis or perinephric stranding. There are numerous phleboliths in the pelvis that displays similar distribution and appearance compared to prior imaging. Urinary bladder is collapsed. Stomach/Bowel: Much of the stomach again is herniated into the chest. No acute small bowel process. Fluid-filled small bowel loops of predominantly distal small bowel without substantial distension and without adjacent stranding. Moderate stool in the rectum without perirectal stranding. Colonic diverticulosis throughout the colon. No signs of colonic inflammation. Appendix not visualized, no secondary signs to suggest appendicitis. Small subcostal abdominal wall laxity contains portion of the descending colon without sign of obstruction or inflammation. Vascular/Lymphatic: Atheromatous plaque of the abdominal aorta without aneurysmal dilation. There is no gastrohepatic or hepatoduodenal ligament lymphadenopathy. No retroperitoneal or mesenteric lymphadenopathy. No pelvic sidewall lymphadenopathy. Reproductive: Cystic lesion LEFT adnexa 3.5 x 2.5 x 3.5 cm Other: No  free intra-abdominal air.  No ascites. Musculoskeletal: Osteopenia. Spinal degenerative changes. No acute bone finding or destructive bone process. T12 and L1 with loss of height, chronic and better evaluated on dedicated spinal recons, reported  separately. IMPRESSION: No acute intra-abdominal pathology.  Post bilateral adrenalectomy. Cystic lesion LEFT adnexa 3.5 x 2.5 x 3.5 cm. 3.5 cm left adnexal simple-appearing cyst, not adequately characterized. Recommend prompt follow-up with pelvic US, this could be performed on a nonemergent basis. Reference: JACR 2020 Feb;17(2):248-254 Large hiatal hernia as on previous imaging. Nephrolithiasis without signs of hydronephrosis or perinephric stranding. Three-vessel coronary artery disease. Aortic Atherosclerosis (ICD10-I70.0) and Emphysema (ICD10-J43.9). Electronically Signed   By: Zetta Bills M.D.   On: 02/15/2021 15:07    Procedures .Critical Care Performed by: Anselmo Pickler, PA-C Authorized by: Anselmo Pickler, PA-C   Critical care provider statement:    Critical care time (minutes): 32.   Critical care was necessary to treat or prevent imminent or life-threatening deterioration of the following conditions:  Sepsis   Critical care was time spent personally by me on the following activities:  Development of treatment plan with patient or surrogate, ordering and review of laboratory studies, ordering and review of radiographic studies, re-evaluation of patient's condition, review of old charts, examination of patient, obtaining history from patient or surrogate, interpretation of cardiac output measurements and ordering and performing treatments and interventions   Care discussed with: admitting provider     Medications Ordered in ED Medications  diltiazem (CARDIZEM) 1 mg/mL load via infusion 10 mg (10 mg Intravenous Bolus from Bag 02/16/2021 1728)    And  diltiazem (CARDIZEM) 125 mg in dextrose 5% 125 mL (1 mg/mL) infusion (5 mg/hr  Intravenous New Bag/Given 02/25/2021 1735)  fludrocortisone (FLORINEF) tablet 0.1 mg (has no administration in time range)  hydrocortisone (CORTEF) tablet 10 mg (has no administration in time range)  gabapentin (NEURONTIN) capsule 300 mg (has no administration in time range)  latanoprost (XALATAN) 0.005 % ophthalmic solution 1 drop (has no administration in time range)  sodium chloride flush (NS) 0.9 % injection 3 mL (has no administration in time range)  0.9 % NaCl with KCl 40 mEq / L  infusion (has no administration in time range)  acetaminophen (TYLENOL) tablet 650 mg (has no administration in time range)    Or  acetaminophen (TYLENOL) suppository 650 mg (has no administration in time range)  polyethylene glycol (MIRALAX / GLYCOLAX) packet 17 g (has no administration in time range)  sorbitol 70 % solution 30 mL (has no administration in time range)  ondansetron (ZOFRAN) tablet 4 mg (has no administration in time range)    Or  ondansetron (ZOFRAN) injection 4 mg (has no administration in time range)  aspirin EC tablet 81 mg (has no administration in time range)  levalbuterol (XOPENEX) nebulizer solution 0.63 mg (has no administration in time range)  ipratropium (ATROVENT) nebulizer solution 0.5 mg (has no administration in time range)  guaiFENesin (MUCINEX) 12 hr tablet 1,200 mg (has no administration in time range)  pantoprazole sodium (PROTONIX) 40 mg/20 mL oral suspension 40 mg (has no administration in time range)  potassium chloride SA (KLOR-CON) CR tablet 20 mEq (has no administration in time range)  Chlorhexidine Gluconate Cloth 2 % PADS 6 each (6 each Topical Given 02/24/2021 1917)  piperacillin-tazobactam (ZOSYN) IVPB 3.375 g (has no administration in time range)  hydrocortisone (CORTEF) tablet 5 mg (has no administration in time range)  gabapentin (NEURONTIN) capsule 300 mg (has no administration in time range)  lactated ringers bolus 1,000 mL (0 mLs Intravenous Stopped 02/16/2021 1615)   cefTRIAXone (ROCEPHIN) 1 g in sodium chloride 0.9 % 100 mL IVPB (0 g Intravenous Stopped 03/08/2021 1711)  azithromycin (ZITHROMAX)  tablet 500 mg (500 mg Oral Given 03/02/2021 1605)  hydrocortisone (CORTEF) tablet 5 mg (5 mg Oral Given 03/04/2021 1605)  ketorolac (TORADOL) 15 MG/ML injection 15 mg (15 mg Intravenous Given 02/23/2021 1610)  fentaNYL (SUBLIMAZE) injection 50 mcg (50 mcg Intravenous Given 03/05/2021 1609)  sodium chloride 0.9 % bolus 1,000 mL (1,000 mLs Intravenous New Bag/Given 02/21/2021 1728)    ED Course  I have reviewed the triage vital signs and the nursing notes.  Pertinent labs & imaging results that were available during my care of the patient were reviewed by me and considered in my medical decision making (see chart for details).  Clinical Course as of 02/16/2021 1934  Fri Mar 16, 2021  1459 Hx lung ca, generalized weakness, decreased appetite, lower back pain -- negative, potentially ambulate and d/c but pending labs. Potentially need some hx from family [CP]    Clinical Course User Index [CP] Zekiah Caruth, Joesph Fillers, PA-C   MDM Rules/Calculators/A&P                         Patient work-up significant for new infiltrate right upper lobe concerning for pneumonia.  Patient hypotensive, as well as tachycardic and tachypneic.  Patient new onset A. fib with RVR.  Patient denies history of A. fib.  Patient does not recall ever having elevated heart rate in the past.  Does not currently feel as though she has an elevated heart rate.  Given unclear history we will hold off on cardioversion at this time.  Diltiazem bolus and drip started for rate control.  Administered lactated Ringer's.  Begin empiric antibiotics with presumed pulmonary source.  Patient has not provided urine sample at time of admission.  We will order in and out cath to assess for concha mitten urinary tract infection.  Patient does require something for pain control, we will give her some Toradol and fentanyl at this time.   Work-up additionally significant for a macrocytic anemia.  CT renal stone study and CT L-spine without new abnormality.  Consulted with hospitalist at this time for admission for pneumonia, new onset A. fib with RVR, hypotension.  Patient does meet sepsis criteria at this time. Final Clinical Impression(s) / ED Diagnoses Final diagnoses:  Back pain  Pneumonia of right upper lobe due to infectious organism  Atrial fibrillation, unspecified type [facility])    Rx / DC Orders ED Discharge Orders     None        Dorien Chihuahua 02/25/2021 1934    Daleen Bo, MD 03/17/21 1108

## 2021-03-16 NOTE — ED Notes (Signed)
Patient transported to CT 

## 2021-03-16 NOTE — ED Provider Notes (Signed)
I provided a substantive portion of the care of this patient.  I personally performed the entirety of the medical decision making for this encounter.  EKG Interpretation  Date/Time:  Friday March 16 2021 13:46:56 EDT Ventricular Rate:  102 PR Interval:  141 QRS Duration: 79 QT Interval:  341 QTC Calculation: 445 R Axis:   47 Text Interpretation: Sinus tachycardia No significant change since last tracing Confirmed by Calvert Cantor [phone]) on 02/23/2021 2:84:49 PM   86 year old presents with ongoing mid lumbar sacral back pain.  No focal weakness in her lower extremities.  Will order imaging at this time as well as urinalysis   Lacretia Leigh, MD 02/26/2021 1442

## 2021-03-16 NOTE — Plan of Care (Signed)

## 2021-03-16 NOTE — Consult Note (Signed)
Cardiology Consultation:   Patient ID: Leslie Petersen; [id number]; 06-Mar-1935   Admit date: 03/14/2021 Date of Consult: 02/19/2021  Primary Care Provider: Kathyrn Lass, MD Primary Cardiologist: None  Primary Electrophysiologist:  None   Patient Profile:   Leslie Petersen is a 86 y.o. female without a prior cardiac history who is being seen today for Leslie evaluation of atrial fib at Leslie request of Dr. Grandville Silos.  History of Present Illness:   Leslie Petersen is admitted with weakness and possible pneumonia with elevated lactic acid.  She has new onset atrial fib and we are asked to consult.  She is found to have new right upper lobe airspace disease.  Leslie patient has no past cardiac history.  She has a history of adrenocortical carcinoma with right adrenalectomy.  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  These have been apparently stable and I looked at Leslie last note from oncology and she was on yearly follow-up.  She has had a chest CT in 1 year.  She goes to [employer] every day.  Her Petersen says that she has been losing weight and he really has an appetite but that she is still functional.  She states she gets around Leslie house with a walker.  She gets her self up and has never had any palpitations, presyncope or syncope.  However, today when her Petersen called to check on her while Leslie Petersen was at work Leslie mom did not answer Leslie phone.  She probably for Leslie last few days has been weaker but it was profound when Leslie Petersen came home today.  She could hardly get up.  She was very slow in her responsiveness.  There were no focal findings apparently when she was evaluated in Leslie emergency room.  Patient had cough nonproductive but no fevers or chills.  She was noted to be in atrial fibrillation and has subsequently converted to sinus rhythm after Cardizem.  She would not notice this.  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   Esophageal polyp    GERD (gastroesophageal reflux disease)    Glaucoma    Hard of hearing    History of hiatal hernia    Lung cancer, upper lobe, left 2011   adenocarcinoma resected    Metastasis to adrenal gland ([place]) dx'd 1994, 2011   1994 left, 2011 right, back tumor 2015   Osteoporosis    S/P radiation therapy 9/26-16-03/20/15   SBRT LUL 54 Gy/58fx   Urinary frequency    Wears glasses     Past Surgical History:  Procedure Laterality Date   ADRENAL GLAND SURGERY  1990/2011   CATARACT EXTRACTION, BILATERAL Bilateral    COLONOSCOPY  multiple   ESOPHAGOGASTRODUODENOSCOPY     ESOPHAGOGASTRODUODENOSCOPY N/A 09/16/2014   Procedure: ESOPHAGOGASTRODUODENOSCOPY (EGD);  Surgeon: [name], MD;  Location: [facility];  Service: Endoscopy;  Laterality: N/A;   EYE SURGERY     HOT HEMOSTASIS N/A 09/16/2014   Procedure: HOT HEMOSTASIS (ARGON PLASMA COAGULATION/BICAP);  Surgeon: [name], MD;  Location: [facility];  Service: Endoscopy;  Laterality: N/A;   LUNG CANCER SURGERY  08/2009   malignant tumor from back  June 2015   at [facility] "related  to adrenal gland cancer" per pt   TUBAL LIGATION     VIDEO BRONCHOSCOPY WITH ENDOBRONCHIAL NAVIGATION N/A 02/06/2015   Procedure: VIDEO BRONCHOSCOPY WITH ENDOBRONCHIAL NAVIGATION;  Surgeon: Grace Isaac, MD;  Location: [facility];  Service: Thoracic;  Laterality: N/A;   VIDEO BRONCHOSCOPY WITH ENDOBRONCHIAL ULTRASOUND N/A 10/18/2014   Procedure: VIDEO BRONCHOSCOPY WITH ENDOBRONCHIAL ULTRASOUND;  Surgeon: Grace Isaac, MD;  Location: [facility];  Service: Thoracic;  Laterality: N/A;   VIDEO BRONCHOSCOPY WITH ENDOBRONCHIAL ULTRASOUND N/A 02/06/2015   Procedure: VIDEO BRONCHOSCOPY WITH ENDOBRONCHIAL ULTRASOUND;  Surgeon: Grace Isaac, MD;  Location: [facility];  Service: Thoracic;  Laterality:  N/A;     Home Medications:  Prior to Admission medications   Medication Sig Start Date End Date Taking? Authorizing Provider  atorvastatin (LIPITOR) 10 MG tablet Take 10 mg by mouth at bedtime. 04/27/17  Yes [provider]  Cholecalciferol (VITAMIN D3) 1000 UNITS CAPS Take 1,000 Units by mouth daily.    Yes [provider]  fludrocortisone (FLORINEF) 0.1 MG tablet Take 0.1 mg by mouth daily. At 8:00 am 09/12/11  Yes [provider]  gabapentin (NEURONTIN) 300 MG capsule Take one pill tid Patient taking differently: Take 300 mg by mouth as directed. Take 1 capsule (300 mg) at bedtime and Take 1 capsule (300 mg) Daily PRN Neuropathy 08/22/16  Yes Milton Ferguson, MD  hydrocortisone (CORTEF) 10 MG tablet Take 5-10 mg by mouth as directed. Take 10 mg tablet by mouth at 8:00 am 5 mg tablet at noon 09/12/11  Yes [provider]  latanoprost (XALATAN) 0.005 % ophthalmic solution Place 1 drop into both eyes at bedtime. 04/09/20  Yes [provider]  Multiple Vitamins-Minerals (MULTIVITAMIN WITH MINERALS) tablet Take 1 tablet by mouth daily after lunch.    Yes [provider]  olmesartan (BENICAR) 40 MG tablet Take 40 mg by mouth daily. 04/19/20  Yes [provider]  Pseudoephedrine HCl (SINUS 12 HOUR PO) Take 1 tablet by mouth daily.   Yes [provider]    Inpatient Medications: Scheduled Meds:  Continuous Infusions:  diltiazem (CARDIZEM) infusion 5 mg/hr (03/15/2021 1735)   PRN Meds:   Allergies:    Allergies  Allergen Reactions   Mirabegron Other (See Comments)    Patient had adverse effect to medication - doesn't recall reaction    Urabeth [Bethanechol] Other (See Comments)    Pt doesn't remember Leslie reaction    Social History:   Social History   Socioeconomic History   Marital status: Divorced    Spouse name: Not on file   Number of children: Not on file   Years of education: Not on file   Highest education  level: Not on file  Occupational History   Not on file  Tobacco Use   Smoking status: Former    Types: Cigarettes    Quit date: 02/17/1989    Years since quitting: 32.0   Smokeless tobacco: Never  Substance and Sexual Activity   Alcohol use: No   Drug use: No   Sexual activity: Not on file  Other Topics Concern   Not on file  Social History Narrative   Not on file   Social Determinants of Health   Financial Resource Strain: Not on file  Food Insecurity: Not on file  Transportation Needs: Not on file  Physical Activity: Not on file  Stress: Not on file  Social Connections: Not on file  Intimate Partner Violence: Not on file    Family  History:    Family History  Problem Relation Age of Onset   Heart attack Mother    Heart attack Father      ROS:  Please see Leslie history of present illness.  ROS  Positive for anorexia and weight loss all other ROS reviewed and negative.     Physical Exam/Data:   Vitals:   03/10/2021 1710 03/02/2021 1720 03/01/2021 1740 02/17/2021 1800  BP: (!) 96/57 (!) 97/49 97/61 (!) 94/48  Pulse: (!) 44 (!) 45 (!) 106 90  Resp: (!) 32 (!) 28 (!) 26 (!) 36  Temp:      TempSrc:      SpO2: 95% 95% 93% 95%    Intake/Output Summary (Last 24 hours) at 03/15/2021 1807 Last data filed at 02/25/2021 1711 Gross per 24 hour  Intake 1100 ml  Output --  Net 1100 ml   There were no vitals filed for this visit. There is no height or weight on file to calculate BMI.  GENERAL: Frail appearing appearing HEENT:   Pupils equal round and reactive, fundi not visualized, oral mucosa unremarkable NECK:  No  jugular venous distention, waveform within normal limits, carotid upstroke brisk and symmetric, no bruits, no thyromegaly LYMPHATICS:  No cervical, inguinal adenopathy LUNGS:   Clear to auscultation bilaterally BACK:  No CVA tenderness CHEST:   Unremarkable HEART:  PMI not displaced or sustained,S1 and S2 within normal limits, no S3, no S4, no clicks, no rubs, no  murmurs ABD:  Flat, positive bowel sounds normal in frequency in pitch, no bruits, no rebound, no guarding, no midline pulsatile mass, no hepatomegaly, no splenomegaly EXT:  2 plus pulses throughout, no  edema, no cyanosis no clubbing SKIN:  No rashes no nodules NEURO:   Cranial nerves II through XII grossly intact, motor grossly intact throughout PSYCH:    Cognitively intact, oriented to person place and time   EKG:  Leslie EKG was personally reviewed and demonstrates: Atrial fibrillation, rate 133, axis within normal limits, intervals within normal limits, no acute ST-T wave changes. Telemetry:  Telemetry was personally reviewed and demonstrates: Sinus rhythm  Relevant CV Studies: None  Laboratory Data:  Chemistry Recent Labs  Lab 03/03/2021 1416  NA 145  K 3.2*  CL 110  CO2 26  GLUCOSE 115*  BUN 15  CREATININE 0.74  CALCIUM 8.7*  GFRNONAA >60  ANIONGAP 9    Recent Labs  Lab 02/17/2021 1416  PROT 6.2*  ALBUMIN 2.9*  AST 36  ALT 21  ALKPHOS 48  BILITOT 1.2   Hematology Recent Labs  Lab 03/05/2021 1416  WBC 8.4  RBC 2.92*  HGB 10.6*  HCT 32.7*  MCV 112.0*  MCH 36.3*  MCHC 32.4  RDW 13.2  PLT 147*   Cardiac EnzymesNo results for input(s): TROPONINI in Leslie last 168 hours. No results for input(s): TROPIPOC in Leslie last 168 hours.  BNPNo results for input(s): BNP, PROBNP in Leslie last 168 hours.  DDimer No results for input(s): DDIMER in Leslie last 168 hours.  Radiology/Studies:  DG Chest 1 View  Result Date: 03/14/2021 CLINICAL DATA:  Weakness and right flank pain. EXAM: CHEST  1 VIEW COMPARISON:  Chest x-ray 02/06/2015.  CT chest 05/09/2020. FINDINGS: Postsurgical changes in Leslie left upper lobe appear stable. There is increasing right upper lobe/perihilar airspace disease. There is a band of opacity in Leslie right upper lobe similar to Leslie prior study. A large hiatal hernia is again seen. There are atherosclerotic calcifications of Leslie aorta. Heart  size is within normal  limits. Leslie costophrenic angles are clear. There is no pneumothorax. No acute fractures are seen. IMPRESSION: 1. New right upper lobe/perihilar airspace disease. Findings may be related to infection. Other etiologies such as neoplasm cannot be excluded given patient's history. 2. Stable postsurgical changes in Leslie left lung. Electronically Signed   By: Ronney Asters M.D.   On: 03/14/2021 15:06   CT L-SPINE NO CHARGE  Result Date: 03/05/2021 CLINICAL DATA:  Back pain. Worsening weakness for 1 week with right flank pain. EXAM: CT LUMBAR SPINE WITHOUT CONTRAST TECHNIQUE: Multidetector CT imaging of Leslie lumbar spine was performed without intravenous contrast administration. Multiplanar CT image reconstructions were also generated. COMPARISON:  Lumbar spine MRI 09/05/2012 FINDINGS: Segmentation: Leslie lowest fully formed intervertebral disc space is L5-S1. There are no ribs at T12. Alignment: Chronic straightening of Leslie normal lumbar lordosis. No listhesis. Vertebrae: Unchanged chronic T12 and L1 compression fractures. No acute fracture or suspicious osseous lesion. Scattered small Schmorl's nodes. Diffuse osteopenia. Paraspinal and other soft tissues: No acute abnormality identified in Leslie paraspinal soft tissues. Intra-and pelvic contents reported separately. Disc levels: Similar appearance of diffuse lumbar disc and facet degeneration compared to Leslie prior MRI, most notable at L4-5 where there is moderate spinal stenosis, moderate bilateral lateral recess stenosis, and moderate right neural foraminal stenosis. Unchanged moderate left greater than right neural foraminal stenosis at L5-S1. IMPRESSION: 1. No acute osseous abnormality identified in Leslie lumbar spine. 2. Unchanged chronic T12 and L1 compression fractures. 3. Unchanged lumbar disc and facet degeneration. Electronically Signed   By: Logan Bores M.D.   On: 03/05/2021 14:43   CT Renal Stone Study  Result Date: 03/08/2021 CLINICAL DATA:  Flank pain in an  86 year old female for 1 week, difficulty ambulating. EXAM: CT ABDOMEN AND PELVIS WITHOUT CONTRAST TECHNIQUE: Multidetector CT imaging of Leslie abdomen and pelvis was performed following Leslie standard protocol without IV contrast. COMPARISON:  Chest x-ray and lumbar spine evaluation of Leslie same date, CT of Leslie chest, abdomen and pelvis from 2016. FINDINGS: Lower chest: Large hiatal hernia extends into Leslie chest, greater than 50% of Leslie stomach has herniated into Leslie chest as on previous studies. Three-vessel coronary artery disease. No substantial pericardial effusion. Pulmonary emphysema.  No pleural effusion or consolidation. Hepatobiliary: Liver with smooth contours. No pericholecystic stranding. No gross biliary duct distension. Pancreas: Pancreatic atrophy, no signs of inflammation. Spleen: Normal. Adrenals/Urinary Tract: Post bilateral adrenalectomy. Nephrolithiasis on Leslie LEFT, small intrarenal calculi largest in Leslie lower pole measuring approximately 4 mm. No definite ureteral calculi, no signs of hydronephrosis or perinephric stranding. There are numerous phleboliths in Leslie pelvis that displays similar distribution and appearance compared to prior imaging. Urinary bladder is collapsed. Stomach/Bowel: Much of Leslie stomach again is herniated into Leslie chest. No acute small bowel process. Fluid-filled small bowel loops of predominantly distal small bowel without substantial distension and without adjacent stranding. Moderate stool in Leslie rectum without perirectal stranding. Colonic diverticulosis throughout Leslie colon. No signs of colonic inflammation. Appendix not visualized, no secondary signs to suggest appendicitis. Small subcostal abdominal wall laxity contains portion of Leslie descending colon without sign of obstruction or inflammation. Vascular/Lymphatic: Atheromatous plaque of Leslie abdominal aorta without aneurysmal dilation. There is no gastrohepatic or hepatoduodenal ligament lymphadenopathy. No  retroperitoneal or mesenteric lymphadenopathy. No pelvic sidewall lymphadenopathy. Reproductive: Cystic lesion LEFT adnexa 3.5 x 2.5 x 3.5 cm Other: No free intra-abdominal air.  No ascites. Musculoskeletal: Osteopenia. Spinal degenerative changes. No acute bone finding or destructive  bone process. T12 and L1 with loss of height, chronic and better evaluated on dedicated spinal recons, reported separately. IMPRESSION: No acute intra-abdominal pathology.  Post bilateral adrenalectomy. Cystic lesion LEFT adnexa 3.5 x 2.5 x 3.5 cm. 3.5 cm left adnexal simple-appearing cyst, not adequately characterized. Recommend prompt follow-up with pelvic US, this could be performed on a nonemergent basis. Reference: JACR 2020 Feb;17(2):248-254 Large hiatal hernia as on previous imaging. Nephrolithiasis without signs of hydronephrosis or perinephric stranding. Three-vessel coronary artery disease. Aortic Atherosclerosis (ICD10-I70.0) and Emphysema (ICD10-J43.9). Electronically Signed   By: Zetta Bills M.D.   On: 03/05/2021 15:07    Assessment and Plan:   ATRIAL FIB: Leslie patient had paroxysmal H fibrillation.  She is converted spontaneously.  She is on a low-dose of diltiazem which can continue as long as her blood pressure tolerates this.  I would suggest that if her blood pressure falls below 90 tonight this could be discontinued.  She does not need systemic anticoagulation given this provoked isolated event.  I would not suggest long-term Cardizem.  No further work-up is suggested other than TSH.  These labs are outstanding.  HYPOTENSION: Hold Benicar.    For questions or updates, please contact [facility] Please consult [email or web address] for contact info under Cardiology/STEMI.   Signed, Minus Breeding, MD  02/25/2021 6:07 PM

## 2021-03-16 NOTE — Sepsis Progress Note (Signed)
Continue to follow for sepsis monitoring

## 2021-03-16 NOTE — H&P (Signed)
History and Physical    Leslie Petersen PHX:[id number] DOB: 05-02-1935 DOA: 03/15/2021  PCP: Kathyrn Lass, MD Patient coming from: Home  I have personally briefly reviewed patient's old medical records in [place]  Chief Complaint: Generalized weakness.  HPI: Leslie Petersen is a 86 y.o. female with medical history significant of chronic low back pain, adrenal cortical tumors status postresection, GERD, duodenal adenoma, history of left upper lobe lung cancer/adenocarcinoma status postresection presenting to the ED with worsening weakness for the past 2 weeks, decreased oral intake, worsening neuropathy at night, increased weakness.  Patient denies any fevers, no chills, no nausea, no vomiting, no abdominal pain, no chest pain, no shortness of breath, no diarrhea, no dysuria, no melena, no hematemesis, no hematochezia, no cough, no syncopal episode, no lightheadedness, no dizziness.  Daughter at bedside who states patient has just not been herself and this morning patient has significant weakness and could barely get off the toilet and needed help from her daughter.  Daughter also states when she got home for lunch patient was significantly weak and unable to get out of the bed.  EMS was called and patient brought to the ED.  Patient noted to have recently received her COVID booster and the flu shot.  Patient also noted to have had a home COVID test done which per daughter was negative.    ED Course: Patient seen in the ED, Noted to have systolic blood pressures in the 90s, heart rate in the 130s, and noted to be in A. fib.  Urinalysis was not done.  1 set of blood cultures ordered.  COVID-19 PCR was not done.  Comprehensive metabolic profile with a potassium of 3.2, glucose of 115, calcium of 8.7, albumin of 2.9, protein of 6.2 otherwise was within normal limits.  Lactic acid noted to be elevated at 2.4.  CBC done with a hemoglobin of 10.6, platelet count of 147 hours was within normal limits.   Chest x-ray done concerning for new right upper lobe/perihilar airspace disease.  Findings may be related to infection.  Other etiologies such as neoplasm cannot be excluded given patient's history.  Stable postsurgical changes in the left lung.  -CT renal stone protocol done with no acute intra-abdominal pathology, post bilateral adrenalectomy, cystic lesion left adnexa 3.5 x 2.5 x 3.5 cm, 3.5 cm left adnexal simple appearing cyst not adequately catheterized.  Recommend prompt follow-up with pelvic ultrasound which could be performed on an emergent basis.  Large hiatal hernia.  Nephrolithiasis without signs of hydronephrosis or perinephric stranding.  Three-vessel coronary artery disease.  Patient received a liter of LR, repeat lactic acid level not done, patient also given a dose of IV Rocephin and azithromycin.  Patient also placed on a Cardizem drip.  Hospitalist were called to admit the patient for further evaluation and management.  Review of Systems: As per HPI otherwise all other systems reviewed and are negative.  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, 2011   1994 left, 2011 right, back tumor 2015   Osteoporosis    S/P radiation therapy 9/26-16-03/20/15   SBRT LUL 54 Gy/49f   Urinary frequency    Wears glasses     Past Surgical History:  Procedure Laterality Date   ADRENAL GLAND SURGERY  1990/2011   CATARACT EXTRACTION, BILATERAL Bilateral    COLONOSCOPY  multiple   ESOPHAGOGASTRODUODENOSCOPY     ESOPHAGOGASTRODUODENOSCOPY N/A 09/16/2014   Procedure:  ESOPHAGOGASTRODUODENOSCOPY (EGD);  Surgeon: CGatha Mayer MD;  [facility];  Service: Endoscopy;  Laterality: N/A;   EYE SURGERY     HOT HEMOSTASIS N/A 09/16/2014   Procedure: HOT HEMOSTASIS (ARGON PLASMA COAGULATION/BICAP);  Surgeon: CGatha Mayer MD;  [facility];  Service: Endoscopy;  Laterality: N/A;   LUNG CANCER SURGERY  08/2009   [facility] from back  [facility] pt   [place]   Procedure: VIDEO BRONCHOSCOPY WITH ENDOBRONCHIAL NAVIGATION;  Surgeon: EGrace Isaac MD;  [facility]  Service: Thoracic;  Laterality: N/A;   VIDEO BRONCHOSCOPY WITH ENDOBRONCHIAL ULTRASOUND N/A 10/18/2014   Procedure: VIDEO BRONCHOSCOPY WITH ENDOBRONCHIAL ULTRASOUND;  Surgeon: EGrace Isaac MD;  [place] [facility]  Service: Thoracic;  Laterality: N/A;   VIDEO BRONCHOSCOPY WITH ENDOBRONCHIAL ULTRASOUND N/A 02/06/2015   Procedure: VIDEO BRONCHOSCOPY WITH ENDOBRONCHIAL ULTRASOUND;  Surgeon: EGrace Isaac MD;  [place] [facility]  Service: Thoracic;  Laterality: N/A;    Social History  reports that she quit smoking about 32 years ago. She has never used smokeless tobacco. She reports that she does not drink alcohol and does not use drugs.  Allergies  Allergen Reactions   Mirabegron Other (See Comments)    Patient had adverse effect to medication - doesn't recall reaction    Urabeth [Bethanechol] Other (See Comments)    Pt doesn't remember the reaction    Family History  Problem Relation Age of Onset   Heart attack Mother    Heart attack Father    Mother deceased in her 720sto 865sfrom volume overload, father deceased in his 829sfrom coronary artery disease.  Prior to Admission medications   Medication Sig Start Date End Date Taking? Authorizing Provider  atorvastatin (LIPITOR) 10 MG tablet Take 10 mg by mouth at bedtime. 04/27/17  Yes  [provider]  Cholecalciferol (VITAMIN D3) 1000 UNITS CAPS Take 1,000 Units by mouth daily.    Yes [provider]  fludrocortisone (FLORINEF) 0.1 MG tablet Take 0.1 mg by mouth daily. At 8:00 am 09/12/11  Yes [provider]  gabapentin (NEURONTIN) 300 MG capsule Take one pill tid Patient taking differently: Take 300 mg by mouth as directed. Take 1 capsule (300 mg) at bedtime and Take 1 capsule (300 mg) Daily PRN Neuropathy 08/22/16  Yes ZMilton Ferguson MD  hydrocortisone (CORTEF) 10 MG tablet Take 5-10 mg by mouth as directed. Take 10 mg tablet by mouth at 8:00 am 5 mg tablet at noon 09/12/11  Yes [provider]  latanoprost (XALATAN) 0.005 % ophthalmic solution Place 1 drop into both eyes at bedtime. 04/09/20  Yes [provider]  Multiple Vitamins-Minerals (MULTIVITAMIN WITH MINERALS) tablet Take 1 tablet by mouth daily after lunch.    Yes [provider]  olmesartan (BENICAR) 40 MG tablet Take 40 mg by mouth daily. 04/19/20  Yes [provider]  Pseudoephedrine HCl (SINUS 12 HOUR PO) Take 1 tablet by mouth daily.   Yes [provider]    Physical Exam: Vitals:   03/13/2021 1710 02/20/2021 1720 02/19/2021 1740 03/13/2021 1800  BP: (!) 96/57 (!) 97/49 97/61 (!) 94/48  Pulse: (!) 44 (!) 45 (!) 106 90  Resp: (!) 32 (!) 28 (!) 26 (!) 36  Temp:      TempSrc:      SpO2: 95% 95% 93% 95%    Constitutional: NAD, calm, comfortable Vitals:   03/07/2021 1710 02/27/2021 1720 02/21/2021 1740 02/21/2021 1800  BP: (!) 96/57 (!) 97/49 97/61 (!) 94/48  Pulse: (!) 44 (!) 45 (!) 106 90  Resp: (!) 32 (!) 28 (!) 26 (!) 36  Temp:      TempSrc:      SpO2: 95% 95% 93% 95%   Eyes: PERRL, lids and conjunctivae normal ENMT: Mucous membranes are extremely dry.. Posterior pharynx clear of any exudate or lesions.Normal dentition.  Neck: normal, supple, no masses, no thyromegaly Respiratory: clear to auscultation bilaterally anterior lung fields, no  wheezing, no crackles. Normal respiratory effort. No accessory muscle use.  Cardiovascular: Regular rate and rhythm, no murmurs / rubs / gallops. No extremity edema. 2+ pedal pulses. No carotid bruits.  Abdomen: no tenderness, no masses palpated. No hepatosplenomegaly. Bowel sounds positive.  Musculoskeletal: no clubbing / cyanosis. No joint deformity upper and lower extremities. Good ROM, no contractures. Normal muscle tone.  Skin: no rashes, lesions, ulcers. No induration Neurologic: CN 2-12 grossly intact. Sensation intact, DTR normal. Strength 5/5 in all 4.  Psychiatric: Poor to fair judgment and insight. Alert and oriented x 3. Normal mood.   Labs on Admission: I have personally reviewed following labs and imaging studies  CBC: Recent Labs  Lab 03/11/2021 1416  WBC 8.4  NEUTROABS 4.9  HGB 10.6*  HCT 32.7*  MCV 112.0*  PLT 147*    Basic Metabolic Panel: Recent Labs  Lab 03/15/2021 1416  NA 145  K 3.2*  CL 110  CO2 26  GLUCOSE 115*  BUN 15  CREATININE 0.74  CALCIUM 8.7*    GFR: CrCl cannot be calculated (Unknown ideal weight.).  Liver Function Tests: Recent Labs  Lab 03/10/2021 1416  AST 36  ALT 21  ALKPHOS 48  BILITOT 1.2  PROT 6.2*  ALBUMIN 2.9*    Urine analysis:    Component Value Date/Time   COLORURINE GREEN (A) 06/12/2014 1136   APPEARANCEUR CLEAR 06/12/2014 1136   LABSPEC 1.022 06/12/2014 1136   PHURINE 7.0 06/12/2014 1136   GLUCOSEU NEGATIVE 06/12/2014 1136   HGBUR TRACE (A) 06/12/2014 1136   BILIRUBINUR NEGATIVE 06/12/2014 1136   KETONESUR 15 (A) 06/12/2014 1136   PROTEINUR NEGATIVE 06/12/2014 1136   UROBILINOGEN 1.0 06/12/2014 1136   NITRITE NEGATIVE 06/12/2014 1136   LEUKOCYTESUR NEGATIVE 06/12/2014 1136    Radiological Exams on Admission: DG Chest 1 View  Result Date: 02/16/2021 CLINICAL DATA:  Weakness and right flank pain. EXAM: CHEST  1 VIEW COMPARISON:  Chest x-ray 02/06/2015.  CT chest 05/09/2020. FINDINGS: Postsurgical changes in  the left upper lobe appear stable. There is increasing right upper lobe/perihilar airspace disease. There is a band of opacity in the right upper lobe similar to the prior study. A large hiatal hernia is again seen. There are atherosclerotic calcifications of the aorta. Heart size is within normal limits. The costophrenic angles are clear. There is no pneumothorax. No acute fractures are seen. IMPRESSION: 1. New right upper lobe/perihilar  airspace disease. Findings may be related to infection. Other etiologies such as neoplasm cannot be excluded given patient's history. 2. Stable postsurgical changes in the left lung. Electronically Signed   By: Ronney Asters M.D.   On: 02/20/2021 15:06   CT L-SPINE NO CHARGE  Result Date: 02/19/2021 CLINICAL DATA:  Back pain. Worsening weakness for 1 week with right flank pain. EXAM: CT LUMBAR SPINE WITHOUT CONTRAST TECHNIQUE: Multidetector CT imaging of the lumbar spine was performed without intravenous contrast administration. Multiplanar CT image reconstructions were also generated. COMPARISON:  Lumbar spine MRI 09/05/2012 FINDINGS: Segmentation: The lowest fully formed intervertebral disc space is L5-S1. There are no ribs at T12. Alignment: Chronic straightening of the normal lumbar lordosis. No listhesis. Vertebrae: Unchanged chronic T12 and L1 compression fractures. No acute fracture or suspicious osseous lesion. Scattered small Schmorl's nodes. Diffuse osteopenia. Paraspinal and other soft tissues: No acute abnormality identified in the paraspinal soft tissues. Intra-and pelvic contents reported separately. Disc levels: Similar appearance of diffuse lumbar disc and facet degeneration compared to the prior MRI, most notable at L4-5 where there is moderate spinal stenosis, moderate bilateral lateral recess stenosis, and moderate right neural foraminal stenosis. Unchanged moderate left greater than right neural foraminal stenosis at L5-S1. IMPRESSION: 1. No acute osseous  abnormality identified in the lumbar spine. 2. Unchanged chronic T12 and L1 compression fractures. 3. Unchanged lumbar disc and facet degeneration. Electronically Signed   By: Logan Bores M.D.   On: 02/23/2021 14:43   CT Renal Stone Study  Result Date: 03/04/2021 CLINICAL DATA:  Flank pain in an 86 year old female for 1 week, difficulty ambulating. EXAM: CT ABDOMEN AND PELVIS WITHOUT CONTRAST TECHNIQUE: Multidetector CT imaging of the abdomen and pelvis was performed following the standard protocol without IV contrast. COMPARISON:  Chest x-ray and lumbar spine evaluation of the same date, CT of the chest, abdomen and pelvis from 2016. FINDINGS: Lower chest: Large hiatal hernia extends into the chest, greater than 50% of the stomach has herniated into the chest as on previous studies. Three-vessel coronary artery disease. No substantial pericardial effusion. Pulmonary emphysema.  No pleural effusion or consolidation. Hepatobiliary: Liver with smooth contours. No pericholecystic stranding. No gross biliary duct distension. Pancreas: Pancreatic atrophy, no signs of inflammation. Spleen: Normal. Adrenals/Urinary Tract: Post bilateral adrenalectomy. Nephrolithiasis on the LEFT, small intrarenal calculi largest in the lower pole measuring approximately 4 mm. No definite ureteral calculi, no signs of hydronephrosis or perinephric stranding. There are numerous phleboliths in the pelvis that displays similar distribution and appearance compared to prior imaging. Urinary bladder is collapsed. Stomach/Bowel: Much of the stomach again is herniated into the chest. No acute small bowel process. Fluid-filled small bowel loops of predominantly distal small bowel without substantial distension and without adjacent stranding. Moderate stool in the rectum without perirectal stranding. Colonic diverticulosis throughout the colon. No signs of colonic inflammation. Appendix not visualized, no secondary signs to suggest appendicitis.  Small subcostal abdominal wall laxity contains portion of the descending colon without sign of obstruction or inflammation. Vascular/Lymphatic: Atheromatous plaque of the abdominal aorta without aneurysmal dilation. There is no gastrohepatic or hepatoduodenal ligament lymphadenopathy. No retroperitoneal or mesenteric lymphadenopathy. No pelvic sidewall lymphadenopathy. Reproductive: Cystic lesion LEFT adnexa 3.5 x 2.5 x 3.5 cm Other: No free intra-abdominal air.  No ascites. Musculoskeletal: Osteopenia. Spinal degenerative changes. No acute bone finding or destructive bone process. T12 and L1 with loss of height, chronic and better evaluated on dedicated spinal recons, reported separately. IMPRESSION: No acute intra-abdominal pathology.  Post  bilateral adrenalectomy. Cystic lesion LEFT adnexa 3.5 x 2.5 x 3.5 cm. 3.5 cm left adnexal simple-appearing cyst, not adequately characterized. Recommend prompt follow-up with pelvic US, this could be performed on a nonemergent basis. Reference: JACR 2020 Feb;17(2):248-254 Large hiatal hernia as on previous imaging. Nephrolithiasis without signs of hydronephrosis or perinephric stranding. Three-vessel coronary artery disease. Aortic Atherosclerosis (ICD10-I70.0) and Emphysema (ICD10-J43.9). Electronically Signed   By: Zetta Bills M.D.   On: 03/05/2021 15:07    EKG: Independently reviewed.  Atrial fibrillation heart rate 136  Assessment/Plan Principal Problem:   Sepsis ([place]) Active Problems:   Osteoporosis   Hypotension   Adrenocortical carcinoma (HCC)   New onset atrial fibrillation (HCC)   Chronic back pain   Generalized weakness   CAP (community acquired pneumonia)   Dehydration   Hypokalemia   Anemia   Adnexal cyst   #1 sepsis rule out felt secondary to CAP versus aspiration pneumonia -Patient on presentation met criteria for sepsis with elevated lactic acid level of 2.4, noted to be hypotensive with systolics in the 62I, noted to be tachycardic with  heart rates of 136 and in A. fib.  Chest x-ray concerning for right upper lobe pneumonia. -Check blood cultures x2, repeat lactic acid level, check UA with cultures and sensitivities. -Check urine Legionella antigen, check urine pneumococcus antigen. -Check a procalcitonin level. -SLP. -Placed on a full liquid diet. -Place empirically on IV Zosyn. -IV fluids, supportive care.  2.  New onset atrial fibrillation Cha2ds2vasc SCORE 4 -Patient presenting with paroxysmal new onset A. fib likely secondary to problem #1.  Patient with no history of A. fib. -Cycle cardiac enzymes x2, check a TSH, check a 2D echo. -Currently on Cardizem drip which we will continue. -Cardiology consultation. -Patient not a good anticoagulation candidate and also due to first episode of paroxysmal atrial fibrillation. -Supportive care.  3.  Dehydration IV fluids.  4.  Lactic acidosis Likely secondary to problem #1 versus dehydration.  Repeat lactic acid level pending. -We will give a 1 L fluid bolus, placed on IV fluids at 100 cc an hour. -Panculture. -IV Zosyn.  5.  Hypokalemia -Check a magnesium level. -Replete.  6.  Status post bilateral adrenalectomy -Patient noted to be on Florinef and Cortef prior to admission which we will resume. -IV fluids.  7.  Generalized weakness -Patient with chronic back pain has had some worsening weakness over the past couple of weeks per daughter with decreased oral intake.  Patient with decreased urine output.  Patient clinically dry on examination. -IV fluids. -Patient pancultured. -Empiric IV antibiotics. -PT/OT.  8.  Left adnexal cyst -Noted on CT renal stone protocol. -We will need pelvic ultrasound and OB/GYN follow-up in the outpatient setting.  9.  Anemia -No overt bleeding. -Check an anemia panel. -Follow H&H. -Transfusion threshold hemoglobin < 7.  DVT prophylaxis: Lovenox Code Status:   DNR Family Communication:  Updated patient and daughter at  bedside. Disposition Plan:   Patient is from:  Home  Anticipated DC to:  TBD  Anticipated DC date:  2 to 3 days per  Anticipated DC barriers: Clinical improvement.  Consults called:  Cardiology: Dr. Percival Spanish Admission status:  Admit to inpatient/stepdown unit.  Severity of Illness: The appropriate patient status for this patient is INPATIENT. Inpatient status is judged to be reasonable and necessary in order to provide the required intensity of service to ensure the patient's safety. The patient's presenting symptoms, physical exam findings, and initial radiographic and laboratory data in the context of  their chronic comorbidities is felt to place them at high risk for further clinical deterioration. Furthermore, it is not anticipated that the patient will be medically stable for discharge from the hospital within 2 midnights of admission. The following factors support the patient status of inpatient.   * I certify that at the point of admission it is my clinical judgment that the patient will require inpatient hospital care spanning beyond 2 midnights from the point of admission due to high intensity of service, high risk for further deterioration and high frequency of surveillance required.*    Irine Seal MD [facility]  How to contact the [facility] Attending or Consulting provider [place] or covering provider during after hours [place], for this patient?   Check the care team in [facility] and look for a) attending/consulting [facility] provider listed and b) the [facility] team listed Log into [email or web address] and use [place]'s universal password to access. If you do not have the password, please contact the hospital operator. Locate the [facility] provider you are looking for under [facility] and page to a number that you can be directly reached. If you still have difficulty reaching the provider, please page the [facility] (Director on Call) for the Hospitalists listed on amion for  assistance.  02/26/2021, 6:30 PM

## 2021-03-16 NOTE — ED Notes (Signed)
Confirmed Lab received all of her labwork.

## 2021-03-16 NOTE — Progress Notes (Signed)
Pharmacy Antibiotic Note  Leslie Petersen is a 86 y.o. female admitted on 03/06/2021 with sepsis.  Pharmacy has been consulted for piperacillin/tazobactam dosing.  Today, 03/11/2021 WBC WNL SCr 0.74, CrCl 33 mL/min using SCr rounded to 1 Afebrile  Patient received a dose of ceftriaxone and azithromycin in ED. Antibiotics being broadened to pip/tazo  Plan: Piperacillin/tazobactam 3.375 g IV q8h EI Monitor renal function, culture data  Weight: 48.5 kg (106 lb 14.8 oz)  Temp (24hrs), Avg:98.6 F (37 C), Min:98.6 F (37 C), Max:98.6 F (37 C)  Recent Labs  Lab 02/17/2021 1416 02/21/2021 1549  WBC 8.4  --   CREATININE 0.74  --   LATICACIDVEN 2.4* 1.3    CrCl cannot be calculated (Unknown ideal weight.).    Allergies  Allergen Reactions   Mirabegron Other (See Comments)    Patient had adverse effect to medication - doesn't recall reaction    Urabeth [Bethanechol] Other (See Comments)    Pt doesn't remember the reaction    Antimicrobials this admission: Piperacillin/tazobactam 9/30 >>  Ceftriaxone + azithromycin x1 in ED 9/30  Dose adjustments this admission:  Microbiology results: 9/30 BCx:  9/30 UCx:    Thank you for allowing pharmacy to be a part of this patient's care.  Lenis Noon, PharmD 03/14/2021 6:56 PM

## 2021-03-16 NOTE — ED Provider Notes (Signed)
[facility] DEPT Provider Note   CSN: [id number] Arrival date & time: 03/03/2021  1328     History Chief Complaint  Patient presents with   Weakness   Flank Pain    Leslie Petersen is a 86 y.o. female.  The history is provided by the patient and medical records. No language interpreter was used.  Weakness Flank Pain   86 year old female significant history of lung cancer, osteoporosis, GERD brought here via EMS from home for evaluation of generalized weakness per EMS note, patient with worsening weakness and right flank pain for the past week with having difficulty urinating as well as decreased p.o. intake this week.  She also received her COVID booster and flu shot yesterday.  Per patient, she has been having pain to her lower back ongoing for the past several weeks.  Pain is sharp achy intermittent worse with movement and mild to moderate in severity.  She does endorse having some difficulty ambulating and now using a walker.  Patient however denies having any fever chills headache runny nose sneezing coughing sore throat chest pain shortness of breath productive cough dysuria focal numbness.  She denies any recent fall or injury.  She lives at home with her daughter.  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   Esophageal polyp    GERD (gastroesophageal reflux disease)    Glaucoma    Hard of hearing    History of hiatal hernia    Lung cancer, upper lobe, left 2011   adenocarcinoma resected    Metastasis to adrenal gland ([place]) dx'd 1994, 2011   1994 left, 2011 right, back tumor 2015   Osteoporosis    S/P radiation therapy 9/26-16-03/20/15   SBRT LUL 54 Gy/23fx   Urinary frequency    Wears glasses     Patient Active Problem List   Diagnosis Date Noted    Primary cancer of left upper lobe of lung ([place]) 03/29/2015   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   Osteoporosis 02/17/2014   Atypical chest pain 02/17/2014   Hypotension 02/17/2014   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   Adenocarcinoma, lung ([place]) 02/17/2014   Chest pain 02/17/2014   Personal history adenomatous of colonic polyps 10/24/2011    Past Surgical History:  Procedure Laterality Date   ADRENAL GLAND SURGERY  1990/2011   CATARACT EXTRACTION, BILATERAL Bilateral    COLONOSCOPY  multiple   ESOPHAGOGASTRODUODENOSCOPY     ESOPHAGOGASTRODUODENOSCOPY N/A 09/16/2014   Procedure: ESOPHAGOGASTRODUODENOSCOPY (EGD);  Surgeon: [name], MD;  Location: [facility];  Service: Endoscopy;  Laterality: N/A;   EYE SURGERY     HOT HEMOSTASIS N/A 09/16/2014   Procedure: HOT HEMOSTASIS (ARGON PLASMA COAGULATION/BICAP);  Surgeon: [name], MD;  Location: [facility];  Service: Endoscopy;  Laterality: N/A;   LUNG CANCER SURGERY  08/2009   malignant tumor from back  June 2015   at [facility] "related to adrenal gland cancer" per pt   [place] N/A 02/06/2015   Procedure: VIDEO BRONCHOSCOPY WITH ENDOBRONCHIAL NAVIGATION;  Surgeon: Grace Isaac, MD;  Location: [facility];  Service: Thoracic;  Laterality: N/A;   VIDEO BRONCHOSCOPY WITH ENDOBRONCHIAL ULTRASOUND N/A 10/18/2014   Procedure: VIDEO  BRONCHOSCOPY WITH ENDOBRONCHIAL ULTRASOUND;  Surgeon: Grace Isaac, MD;  Location: [facility];  Service: Thoracic;  Laterality: N/A;   VIDEO BRONCHOSCOPY WITH ENDOBRONCHIAL ULTRASOUND N/A 02/06/2015   Procedure: VIDEO BRONCHOSCOPY WITH ENDOBRONCHIAL ULTRASOUND;  Surgeon: Grace Isaac, MD;  Location: [facility];  Service: Thoracic;  Laterality: N/A;     OB History   No obstetric history on file.     Family History  Problem Relation Age of Onset   Heart attack Mother    Heart attack Father     Social History   Tobacco Use   Smoking status:  Former    Types: Cigarettes    Quit date: 02/17/1989    Years since quitting: 32.0   Smokeless tobacco: Never  Substance Use Topics   Alcohol use: No   Drug use: No    Home Medications Prior to Admission medications   Medication Sig Start Date End Date Taking? Authorizing Provider  atorvastatin (LIPITOR) 10 MG tablet Take 10 mg by mouth at bedtime. 04/27/17   [provider]  Cholecalciferol (VITAMIN D3) 1000 UNITS CAPS Take 1,000 Units by mouth daily.     [provider]  docusate sodium (COLACE) 100 MG capsule Take 100 mg by mouth daily as needed for mild constipation.     [provider]  dorzolamide-timolol (COSOPT) 22.3-6.8 MG/ML ophthalmic solution Place 1 drop into both eyes in the morning and at bedtime. 04/24/20   [provider]  fexofenadine (ALLEGRA) 180 MG tablet Take 180 mg by mouth daily.    [provider]  fludrocortisone (FLORINEF) 0.1 MG tablet Take 0.1 mg by mouth daily. At 8:00 am 09/12/11   [provider]  gabapentin (NEURONTIN) 300 MG capsule Take one pill tid 08/22/16   Milton Ferguson, MD  hydrocortisone (CORTEF) 10 MG tablet 10 mg 2 (two) times daily. Take 10 mg tablet by mouth at 8:00 am 5 mg tablet at noon 09/12/11   [provider]  ibuprofen (ADVIL,MOTRIN) 200 MG tablet Take 400 mg by mouth daily as needed (pain). Patient not taking: Reported on 05/18/2020    [provider]  latanoprost (XALATAN) 0.005 % ophthalmic solution Place 1 drop into both eyes daily. 04/09/20   [provider]  Multiple Vitamins-Minerals (MULTIVITAMIN WITH MINERALS) tablet Take 1 tablet by mouth daily after lunch.     [provider]  olmesartan (BENICAR) 40 MG tablet Take 40 mg by mouth daily. 04/19/20   [provider]  polycarbophil (FIBERCON) 625 MG tablet Take 1,250 mg by mouth daily as needed for mild constipation.     [provider]  Vitamins-Lipotropics (LIPO-FLAVONOID PLUS PO)  Take 1 tablet by mouth daily as needed (ringing in ears).     [provider]    Allergies    Mirabegron and Urabeth [bethanechol]  Review of Systems   Review of Systems  Genitourinary:  Positive for flank pain.  Neurological:  Positive for weakness.  All other systems reviewed and are negative.  Physical Exam Updated Vital Signs BP (!) 106/47   Pulse (!) 105   Temp 98.6 F (37 C) (Oral)   Resp (!) 30   SpO2 98%   Physical Exam Vitals and nursing note reviewed.  Constitutional:      General: She is not in acute distress.    Appearance: She is well-developed.     Comments: Elderly female laying in bed appears to be under no acute discomfort  HENT:     Head: Atraumatic.  Eyes:     Conjunctiva/sclera: Conjunctivae normal.  Cardiovascular:     Rate and Rhythm: Tachycardia present. Rhythm irregular.  Pulmonary:     Effort: Pulmonary effort is normal.  Abdominal:     Palpations: Abdomen is soft.     Tenderness: There is no abdominal tenderness. There is no right CVA tenderness or left CVA tenderness.  Musculoskeletal:        General: No tenderness.     Cervical back: Neck supple.     Comments: No significant midline spine tenderness crepitus or step-off.  Skin:    Findings: No rash.  Neurological:     Mental Status: She is alert and oriented to person, place, and time.     Comments: Global weakness but equal strength to all 4 extremities  Psychiatric:        Mood and Affect: Mood normal.    ED Results / Procedures / Treatments   Labs (all labs ordered are listed, but only abnormal results are displayed) Labs Reviewed  CULTURE, BLOOD (SINGLE)  LACTIC ACID, PLASMA  LACTIC ACID, PLASMA  COMPREHENSIVE METABOLIC PANEL  CBC WITH DIFFERENTIAL/PLATELET  PROTIME-INR  APTT  URINALYSIS, ROUTINE W REFLEX MICROSCOPIC    EKG EKG Interpretation  Date/Time:  Friday March 16 2021 13:46:56 EDT Ventricular Rate:  102 PR Interval:  141 QRS Duration: 79 QT  Interval:  341 QTC Calculation: 445 R Axis:   47 Text Interpretation: Sinus tachycardia No significant change since last tracing Confirmed by Calvert Cantor [phone]) on 02/19/2021 2:04:56 PM  Radiology DG Chest 1 View  Result Date: 03/02/2021 CLINICAL DATA:  Weakness and right flank pain. EXAM: CHEST  1 VIEW COMPARISON:  Chest x-ray 02/06/2015.  CT chest 05/09/2020. FINDINGS: Postsurgical changes in the left upper lobe appear stable. There is increasing right upper lobe/perihilar airspace disease. There is a band of opacity in the right upper lobe similar to the prior study. A large hiatal hernia is again seen. There are atherosclerotic calcifications of the aorta. Heart size is within normal limits. The costophrenic angles are clear. There is no pneumothorax. No acute fractures are seen. IMPRESSION: 1. New right upper lobe/perihilar airspace disease. Findings may be related to infection. Other etiologies such as neoplasm cannot be excluded given patient's history. 2. Stable postsurgical changes in the left lung. Electronically Signed   By: Ronney Asters M.D.   On: 02/17/2021 15:06   CT L-SPINE NO CHARGE  Result Date: 03/02/2021 CLINICAL DATA:  Back pain. Worsening weakness for 1 week with right flank pain. EXAM: CT LUMBAR SPINE WITHOUT CONTRAST TECHNIQUE: Multidetector CT imaging of the lumbar spine was performed without intravenous contrast administration. Multiplanar CT image reconstructions were also generated. COMPARISON:  Lumbar spine MRI 09/05/2012 FINDINGS: Segmentation: The lowest fully formed intervertebral disc space is L5-S1. There are no ribs at T12. Alignment: Chronic straightening of the normal lumbar lordosis. No listhesis. Vertebrae: Unchanged chronic T12 and L1 compression fractures. No acute fracture or suspicious osseous lesion. Scattered small Schmorl's nodes. Diffuse osteopenia. Paraspinal and other soft tissues: No acute abnormality identified in the paraspinal soft tissues.  Intra-and pelvic contents reported separately. Disc levels: Similar appearance of diffuse lumbar disc and facet degeneration compared to the prior MRI, most notable at L4-5 where there is moderate spinal stenosis, moderate bilateral lateral recess stenosis, and moderate right neural foraminal stenosis. Unchanged moderate left greater than right neural foraminal stenosis at L5-S1. IMPRESSION: 1. No acute osseous abnormality identified in the lumbar spine. 2. Unchanged chronic T12 and L1 compression  fractures. 3. Unchanged lumbar disc and facet degeneration. Electronically Signed   By: Logan Bores M.D.   On: 03/08/2021 14:43   CT Renal Stone Study  Result Date: 03/14/2021 CLINICAL DATA:  Flank pain in an 86 year old female for 1 week, difficulty ambulating. EXAM: CT ABDOMEN AND PELVIS WITHOUT CONTRAST TECHNIQUE: Multidetector CT imaging of the abdomen and pelvis was performed following the standard protocol without IV contrast. COMPARISON:  Chest x-ray and lumbar spine evaluation of the same date, CT of the chest, abdomen and pelvis from 2016. FINDINGS: Lower chest: Large hiatal hernia extends into the chest, greater than 50% of the stomach has herniated into the chest as on previous studies. Three-vessel coronary artery disease. No substantial pericardial effusion. Pulmonary emphysema.  No pleural effusion or consolidation. Hepatobiliary: Liver with smooth contours. No pericholecystic stranding. No gross biliary duct distension. Pancreas: Pancreatic atrophy, no signs of inflammation. Spleen: Normal. Adrenals/Urinary Tract: Post bilateral adrenalectomy. Nephrolithiasis on the LEFT, small intrarenal calculi largest in the lower pole measuring approximately 4 mm. No definite ureteral calculi, no signs of hydronephrosis or perinephric stranding. There are numerous phleboliths in the pelvis that displays similar distribution and appearance compared to prior imaging. Urinary bladder is collapsed. Stomach/Bowel: Much  of the stomach again is herniated into the chest. No acute small bowel process. Fluid-filled small bowel loops of predominantly distal small bowel without substantial distension and without adjacent stranding. Moderate stool in the rectum without perirectal stranding. Colonic diverticulosis throughout the colon. No signs of colonic inflammation. Appendix not visualized, no secondary signs to suggest appendicitis. Small subcostal abdominal wall laxity contains portion of the descending colon without sign of obstruction or inflammation. Vascular/Lymphatic: Atheromatous plaque of the abdominal aorta without aneurysmal dilation. There is no gastrohepatic or hepatoduodenal ligament lymphadenopathy. No retroperitoneal or mesenteric lymphadenopathy. No pelvic sidewall lymphadenopathy. Reproductive: Cystic lesion LEFT adnexa 3.5 x 2.5 x 3.5 cm Other: No free intra-abdominal air.  No ascites. Musculoskeletal: Osteopenia. Spinal degenerative changes. No acute bone finding or destructive bone process. T12 and L1 with loss of height, chronic and better evaluated on dedicated spinal recons, reported separately. IMPRESSION: No acute intra-abdominal pathology.  Post bilateral adrenalectomy. Cystic lesion LEFT adnexa 3.5 x 2.5 x 3.5 cm. 3.5 cm left adnexal simple-appearing cyst, not adequately characterized. Recommend prompt follow-up with pelvic US, this could be performed on a nonemergent basis. Reference: JACR 2020 Feb;17(2):248-254 Large hiatal hernia as on previous imaging. Nephrolithiasis without signs of hydronephrosis or perinephric stranding. Three-vessel coronary artery disease. Aortic Atherosclerosis (ICD10-I70.0) and Emphysema (ICD10-J43.9). Electronically Signed   By: Zetta Bills M.D.   On: 03/15/2021 15:07    Procedures Procedures   Medications Ordered in ED Medications  cefTRIAXone (ROCEPHIN) 1 g in sodium chloride 0.9 % 100 mL IVPB (has no administration in time range)  azithromycin (ZITHROMAX) tablet 500  mg (has no administration in time range)  lactated ringers bolus 1,000 mL (1,000 mLs Intravenous New Bag/Given 03/06/2021 1417)    ED Course  I have reviewed the triage vital signs and the nursing notes.  Pertinent labs & imaging results that were available during my care of the patient were reviewed by me and considered in my medical decision making (see chart for details).  Clinical Course as of 03/12/2021 1521  Fri Mar 16, 2021  1459 Hx lung ca, generalized weakness, decreased appetite, lower back pain -- negative, potentially ambulate and d/c but pending labs. Potentially need some hx from family [CP]    Clinical Course User Index [CP] Quentin Mulling  H, PA-C   MDM Rules/Calculators/A&P                           BP (!) 107/53   Pulse 97   Temp 98.6 F (37 C) (Oral)   Resp (!) 35   SpO2 94%   Final Clinical Impression(s) / ED Diagnoses Final diagnoses:  Back pain    Rx / DC Orders ED Discharge Orders     None      2:03 PM Initial report was patient is having generalized weakness with right flank pain and decrease in appetite.  Patient endorsed having lower back pain for the past several weeks with increasing pain when she ambulates.  She is alert and oriented x3 and does not have any reproducible pain on exam.  Work up initiated. Care discussed with Dr. Zenia Resides  Attempted to reach out to patient's family member for collateral story however unable to get in touch with anyone.  3:25 PM Pt sign out to oncoming team who will f/u on labs and imaging.  Xray came back showing pneumonia.  Will order abx.  Pt to be admitted for sepsis 2/2 pna.     Domenic Moras, PA-C 03/12/2021 1526    Lacretia Leigh, MD 03/18/21 [phone]

## 2021-03-17 ENCOUNTER — Inpatient Hospital Stay (HOSPITAL_COMMUNITY): Payer: Medicare Other

## 2021-03-17 DIAGNOSIS — I4891 Unspecified atrial fibrillation: Secondary | ICD-10-CM

## 2021-03-17 DIAGNOSIS — N949 Unspecified condition associated with female genital organs and menstrual cycle: Secondary | ICD-10-CM | POA: Diagnosis not present

## 2021-03-17 DIAGNOSIS — M549 Dorsalgia, unspecified: Secondary | ICD-10-CM

## 2021-03-17 DIAGNOSIS — C7400 Malignant neoplasm of cortex of unspecified adrenal gland: Secondary | ICD-10-CM | POA: Diagnosis not present

## 2021-03-17 DIAGNOSIS — I214 Non-ST elevation (NSTEMI) myocardial infarction: Secondary | ICD-10-CM | POA: Diagnosis present

## 2021-03-17 DIAGNOSIS — G8929 Other chronic pain: Secondary | ICD-10-CM

## 2021-03-17 DIAGNOSIS — R531 Weakness: Secondary | ICD-10-CM

## 2021-03-17 DIAGNOSIS — N390 Urinary tract infection, site not specified: Secondary | ICD-10-CM

## 2021-03-17 LAB — URINALYSIS, ROUTINE W REFLEX MICROSCOPIC
Bacteria, UA: NONE SEEN
Bilirubin Urine: NEGATIVE
Glucose, UA: NEGATIVE mg/dL
Ketones, ur: NEGATIVE mg/dL
Nitrite: POSITIVE — AB
Protein, ur: NEGATIVE mg/dL
Specific Gravity, Urine: 1.015 (ref 1.005–1.030)
WBC, UA: >50 WBC/hpf — ABNORMAL HIGH (ref 0–5)
pH: 5 (ref 5.0–8.0)

## 2021-03-17 LAB — CBC WITH DIFFERENTIAL/PLATELET
Abs Immature Granulocytes: 0.02 10*3/uL (ref 0.00–0.07)
Basophils Absolute: 0.1 10*3/uL (ref 0.0–0.1)
Basophils Relative: 1 %
Eosinophils Absolute: 0.1 10*3/uL (ref 0.0–0.5)
Eosinophils Relative: 1 %
HCT: 30 % — ABNORMAL LOW (ref 36.0–46.0)
Hemoglobin: 9.8 g/dL — ABNORMAL LOW (ref 12.0–15.0)
Immature Granulocytes: 0 %
Lymphocytes Relative: 18 %
Lymphs Abs: 1.2 10*3/uL (ref 0.7–4.0)
MCH: 36.2 pg — ABNORMAL HIGH (ref 26.0–34.0)
MCHC: 32.7 g/dL (ref 30.0–36.0)
MCV: 110.7 fL — ABNORMAL HIGH (ref 80.0–100.0)
Monocytes Absolute: 0.6 10*3/uL (ref 0.1–1.0)
Monocytes Relative: 10 %
Neutro Abs: 4.4 10*3/uL (ref 1.7–7.7)
Neutrophils Relative %: 70 %
Platelets: 114 10*3/uL — ABNORMAL LOW (ref 150–400)
RBC: 2.71 MIL/uL — ABNORMAL LOW (ref 3.87–5.11)
RDW: 13.2 % (ref 11.5–15.5)
WBC: 6.4 10*3/uL (ref 4.0–10.5)
nRBC: 0 % (ref 0.0–0.2)

## 2021-03-17 LAB — BASIC METABOLIC PANEL
Anion gap: 4 — ABNORMAL LOW (ref 5–15)
BUN: 14 mg/dL (ref 8–23)
CO2: 25 mmol/L (ref 22–32)
Calcium: 7.7 mg/dL — ABNORMAL LOW (ref 8.9–10.3)
Chloride: 112 mmol/L — ABNORMAL HIGH (ref 98–111)
Creatinine, Ser: 0.58 mg/dL (ref 0.44–1.00)
GFR, Estimated: >60 mL/min (ref >60)
Glucose, Bld: 87 mg/dL (ref 70–99)
Potassium: 4.1 mmol/L (ref 3.5–5.1)
Sodium: 141 mmol/L (ref 135–145)

## 2021-03-17 LAB — STREP PNEUMONIAE URINARY ANTIGEN: Strep Pneumo Urinary Antigen: NEGATIVE

## 2021-03-17 LAB — ECHOCARDIOGRAM COMPLETE
Area-P 1/2: 2.44 cm2
MV VTI: 2.28 cm2
S' Lateral: 1.5 cm
Weight: 1710.77 oz

## 2021-03-17 LAB — MAGNESIUM: Magnesium: 2.3 mg/dL (ref 1.7–2.4)

## 2021-03-17 LAB — LACTIC ACID, PLASMA: Lactic Acid, Venous: 1 mmol/L (ref 0.5–1.9)

## 2021-03-17 LAB — PROCALCITONIN: Procalcitonin: <0.1 ng/mL

## 2021-03-17 LAB — HIV ANTIBODY (ROUTINE TESTING W REFLEX): HIV Screen 4th Generation wRfx: NONREACTIVE

## 2021-03-17 MED ORDER — ENSURE ENLIVE PO LIQD
237.0000 mL | Freq: Three times a day (TID) | ORAL | Status: DC
  Administered 2021-03-17 – 2021-03-19 (×5): 237 mL via ORAL
  Filled 2021-03-17: qty 237

## 2021-03-17 MED ORDER — ADULT MULTIVITAMIN W/MINERALS CH
1.0000 | ORAL_TABLET | Freq: Every day | ORAL | Status: DC
  Administered 2021-03-17 – 2021-03-19 (×3): 1 via ORAL
  Filled 2021-03-17 (×3): qty 1

## 2021-03-17 MED ORDER — SODIUM CHLORIDE 0.9 % IV SOLN
INTRAVENOUS | Status: DC | PRN
  Administered 2021-03-17: 250 mL via INTRAVENOUS

## 2021-03-17 MED ORDER — PANTOPRAZOLE SODIUM 40 MG PO TBEC
40.0000 mg | DELAYED_RELEASE_TABLET | Freq: Every day | ORAL | Status: DC
  Administered 2021-03-17 – 2021-03-19 (×3): 40 mg via ORAL
  Filled 2021-03-17 (×3): qty 1

## 2021-03-17 MED ORDER — SODIUM CHLORIDE 0.9 % IV SOLN: INTRAVENOUS | Status: DC

## 2021-03-17 MED ORDER — METOPROLOL SUCCINATE ER 25 MG PO TB24
12.5000 mg | ORAL_TABLET | Freq: Every day | ORAL | Status: DC
  Administered 2021-03-17 – 2021-03-19 (×3): 12.5 mg via ORAL
  Filled 2021-03-17 (×3): qty 1

## 2021-03-17 MED ORDER — ATORVASTATIN CALCIUM 40 MG PO TABS
80.0000 mg | ORAL_TABLET | Freq: Every day | ORAL | Status: DC
  Administered 2021-03-17: 80 mg via ORAL
  Filled 2021-03-17: qty 2

## 2021-03-17 MED ORDER — ENOXAPARIN SODIUM 30 MG/0.3ML IJ SOSY
30.0000 mg | PREFILLED_SYRINGE | INTRAMUSCULAR | Status: DC
  Administered 2021-03-17 – 2021-03-20 (×4): 30 mg via SUBCUTANEOUS
  Filled 2021-03-17 (×4): qty 0.3

## 2021-03-17 NOTE — Evaluation (Signed)
Speech Language Pathology Evaluation Patient Details Name: Leslie Petersen MRN: [id number] DOB: 12/14/1934 Today's Date: 03/17/2021 Time: 9935-7017 SLP Time Calculation (min) (ACUTE ONLY): 20 min  Problem List:  Patient Active Problem List   Diagnosis Date Noted   Non-ST elevation (NSTEMI) myocardial infarction ([place]) 03/17/2021   Acute lower UTI 03/17/2021   Sepsis ([place]) 03/13/2021   New onset atrial fibrillation ([place]) 03/03/2021   Chronic back pain 03/15/2021   Generalized weakness 02/19/2021   CAP (community acquired pneumonia) 03/13/2021   Dehydration 03/15/2021   Hypokalemia 02/21/2021   Anemia 02/21/2021   Adnexal cyst 03/06/2021   Primary cancer of left upper lobe of lung ([place]) 03/29/2015   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   Osteoporosis 02/17/2014   Atypical chest pain 02/17/2014   Hypotension 02/17/2014   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   Adenocarcinoma, lung ([place]) 02/17/2014   Chest pain 02/17/2014   Personal history adenomatous of colonic polyps 10/24/2011   Past Medical History:  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   Esophageal polyp    GERD (gastroesophageal reflux disease)    Glaucoma    Hard of hearing    History of hiatal hernia    Lung cancer, upper lobe, left 2011   adenocarcinoma resected    Metastasis to adrenal gland ([place]) dx'd 1994, 2011   1994 left, 2011 right, back tumor 2015   Osteoporosis    S/P radiation therapy 9/26-16-03/20/15   SBRT LUL 54 Gy/19fx   Urinary frequency    Wears glasses    Past Surgical History:  Past Surgical History:  Procedure Laterality Date   ADRENAL GLAND SURGERY  1990/2011   CATARACT EXTRACTION, BILATERAL Bilateral    COLONOSCOPY  multiple   ESOPHAGOGASTRODUODENOSCOPY     ESOPHAGOGASTRODUODENOSCOPY N/A 09/16/2014    Procedure: ESOPHAGOGASTRODUODENOSCOPY (EGD);  Surgeon: [name], MD;  Location: [facility];  Service: Endoscopy;  Laterality: N/A;   EYE SURGERY     HOT HEMOSTASIS N/A 09/16/2014   Procedure: HOT HEMOSTASIS (ARGON PLASMA COAGULATION/BICAP);  Surgeon: [name], MD;  Location: [facility];  Service: Endoscopy;  Laterality: N/A;   LUNG CANCER SURGERY  08/2009   malignant tumor from back  June 2015   at [facility] "related to adrenal gland cancer" per pt   TUBAL LIGATION     [place] N/A 02/06/2015   Procedure: VIDEO BRONCHOSCOPY WITH ENDOBRONCHIAL NAVIGATION;  Surgeon: Grace Isaac, MD;  Location: [facility];  Service: Thoracic;  Laterality: N/A;   VIDEO BRONCHOSCOPY WITH ENDOBRONCHIAL ULTRASOUND N/A 10/18/2014   Procedure: VIDEO BRONCHOSCOPY WITH ENDOBRONCHIAL ULTRASOUND;  Surgeon: Grace Isaac, MD;  Location: [facility];  Service: Thoracic;  Laterality: N/A;   VIDEO BRONCHOSCOPY WITH ENDOBRONCHIAL ULTRASOUND N/A 02/06/2015   Procedure: VIDEO BRONCHOSCOPY WITH ENDOBRONCHIAL ULTRASOUND;  Surgeon: Grace Isaac, MD;  Location: [facility];  Service: Thoracic;  Laterality: N/A;   HPI:  Patient is a 86 year old female who presented to the hospital on 9/30 with decreased oral intake and increased weakness. Patient was found to have CAP v.s. aspiration pneumonia, new onset of A fib and non STEMI. 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   Assessment / Plan /  Recommendation Clinical Impression  Pt was seen for a cognitive-linguistic evaluation and she presents with a cognitive impairment with deficits noted in problem solving, organization, and short-term memory.  Daughter was present for this evaluation.  Pt reported that her and her daughter live together and that she is independent with IADLs (including medication and financial management) at baseline.  Pt and daughter reported that the pt is at  or close to her cognitive baseline.  Pt completed the [place] examination in addition to informal evaluation measure.  Pt scored overall 19/30.  See below for additional information.  Expressive and receptive language were functional and no dysarthria was noted.  Recommend continued ST targeting cognitive deficits during acute stay, and supervision/assistance with IADLs at time of discharge.  Pt and daughter verbalized understanding of recommendations. SLP will f/u per POC.   [place] Examination Orientation  3/3  Numeric Problem Solving  1/3  Memory  1/5  Attention 2/2  Thought Organization 2/3  Clock Drawing 0/4  Visuospatial Skills    2/2  Short Story Recall  8/8  Total  19/30 (Dementia Range)    Scoring  High School Education  Less than High School Education   Normal  27-30 25-30  Mild Neurocognitive Disorder 21-26 20-24  Dementia  1-20 1-19       SLP Assessment  SLP Recommendation/Assessment: Patient needs continued Speech Lanaguage Pathology Services SLP Visit Diagnosis: Cognitive communication deficit (R41.841)    Recommendations for follow up therapy are one component of a multi-disciplinary discharge planning process, led by the attending physician.  Recommendations may be updated based on patient status, additional functional criteria and insurance authorization.    Follow Up Recommendations  Other (comment) (Supervisio and assistance with IADLs (including medications and finances))    Frequency and Duration min 2x/week  2 weeks      SLP Evaluation Cognition  Overall Cognitive Status: Impaired/Different from baseline Arousal/Alertness: Awake/alert Orientation Level: Oriented X4 Attention: Focused;Sustained Focused Attention: Appears intact Sustained Attention: Appears intact Memory: Impaired Memory Impairment: Decreased short term memory Decreased Short Term Memory: Verbal basic Awareness: Impaired Awareness Impairment: Emergent impairment Problem  Solving: Impaired Problem Solving Impairment: Verbal complex Executive Function: Organizing Organizing: Impaired Organizing Impairment: Functional complex Safety/Judgment: Appears intact       Comprehension  Auditory Comprehension Overall Auditory Comprehension: Appears within functional limits for tasks assessed    Expression Expression Primary Mode of Expression: Verbal Verbal Expression Overall Verbal Expression: Appears within functional limits for tasks assessed Written Expression Dominant Hand: Right   Oral / Motor  Oral Motor/Sensory Function Overall Oral Motor/Sensory Function: Within functional limits Motor Speech Overall Motor Speech: Appears within functional limits for tasks assessed   GO                   Colin Mulders M.S., [place] Office: [phone]  [place] 03/17/2021, 3:44 PM

## 2021-03-17 NOTE — Progress Notes (Signed)
Initial Nutrition Assessment  DOCUMENTATION CODES:  Underweight  INTERVENTION:  Advance pt to regular diet as tolerated Ensure Enlive po TID, each supplement provides 350 kcal and 20 grams of protein MVI with minerals daily  NUTRITION DIAGNOSIS:  Inadequate oral intake related to decreased appetite as evidenced by per patient/family report (and underweight BMI).  GOAL:  Patient will meet greater than or equal to 90% of their needs  MONITOR:  PO intake, Supplement acceptance, Diet advancement, Labs  REASON FOR ASSESSMENT:  Malnutrition Screening Tool    ASSESSMENT:  86 year old female with history of lung cancer with mets to the adrenal gland, osteoporosis, and GERD presented to ED from home with worsening weakness and right flank pain for the past week also reported decreased PO.  Imaging in ED suggestive of pneumonia and met criteria for sepsis. Pt found to be in atrial fibrillation with elevated troponin. Noted that Daughter reports confusion on the day of admission which is different than pt's baseline. Cardiology attributes to possible outpatient MI.  Pt out of room for imaging at the time of assessment. Reported weight loss and recent poor intake on admission screen. Limited weight hx available over the last year, but noted a 9.5% weight loss in the last ~10 months (05/18/20-03/04/2021) which is not severe, but is concerning given that pt is now underweight and her advanced age. Currently on a full liquid diet.  Will add nutrition supplements to augment intake and encourage adequate intake. Recorded intake this admission so far is excellent.  Average Meal Intake: 10/1: 100% intake x 1 recorded meals   Intake/Output Summary (Last 24 hours) at 03/17/2021 1317 Last data filed at 03/17/2021 1200 Gross per 24 hour  Intake 2451.79 ml  Output 350 ml  Net 2101.79 ml  Net IO Since Admission: 2,101.79 mL [03/17/21 1317]  Nutritionally Relevant Medications: Scheduled Meds:   atorvastatin  80 mg Oral QHS   pantoprazole  40 mg Oral Daily   Continuous Infusions:  sodium chloride 100 mL/hr at 03/17/21 0854   PRN Meds: ondansetron, polyethylene glycol, sorbitol  Labs Reviewed  NUTRITION - FOCUSED PHYSICAL EXAM: Defer to in-person assessment  Diet Order:   Diet Order             Diet full liquid Room service appropriate? Yes; Fluid consistency: Thin  Diet effective now                   EDUCATION NEEDS:  No education needs have been identified at this time  Skin:  Skin Assessment: Reviewed RN Assessment  Last BM:  10/1  Height:  Ht Readings from Last 1 Encounters:  03/17/21 5' 4"  (1.626 m)   Weight:  Wt Readings from Last 1 Encounters:  02/19/2021 48.5 kg    Ideal Body Weight:  54.5 kg  BMI:  Body mass index is 18.35 kg/m.  Estimated Nutritional Needs:  Kcal:  1500-1700 kcal/d Protein:  75-85 g/d Fluid:  1.5-1.8 L/d   Ranell Patrick, RD, LDN Clinical Dietitian Pager on [facility]

## 2021-03-17 NOTE — Evaluation (Signed)
Occupational Therapy Evaluation Patient Details Name: Leslie Petersen MRN: [id number] DOB: Aug 03, 1934 Today's Date: 03/17/2021   History of Present Illness Patient is a 86 year old female who presented to the hosptial on 9/30 with decreased oral intake, increased weakness. patient was found to have CAP v.s. aspiration pneumonia, new onset of A fib and non STEMI. 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 Patient reported living at home with daughter. Currently, patient required min A with constant safety education during transfers. Patient was noted to have decreased safety awareness, decreased functional activity tolerance, decreased endurance, decreased standing balance impacting patients ability to participate in ADLs. Patient would continue to benefit from skilled OT services at this time while admitted and after d/c to address noted deficits in order to improve overall safety and independence in ADLs.       Recommendations for follow up therapy are one component of a multi-disciplinary discharge planning process, led by the attending physician.  Recommendations may be updated based on patient status, additional functional criteria and insurance authorization.   Follow Up Recommendations  Home health OT;Supervision/Assistance - 24 hour          Precautions / Restrictions Precautions Precautions: Fall Restrictions Weight Bearing Restrictions: No      Mobility Bed Mobility Overal bed mobility: Needs Assistance Bed Mobility: Supine to Sit     Supine to sit: Min assist          Transfers Overall transfer level: Needs assistance Equipment used: None Transfers: Sit to/from [facility] Sit to Stand: Min assist Stand pivot transfers: Min assist       General transfer comment: with education to complete  transfers with attempts to sit down half way through each transfer with continued educaiton x3        ADL either performed or assessed with clinical judgement   ADL Overall ADL's : Needs assistance/impaired   Eating/Feeding Details (indicate cue type and reason): patient on clear liquids at this time Grooming: Oral care;Wash/dry face;Sitting;Set up   Upper Body Bathing: Set up;Sitting   Lower Body Bathing: Minimal assistance;Sitting/lateral leans   Upper Body Dressing : Set up;Sitting   Lower Body Dressing: Sit to/from stand;Moderate assistance;Cueing for safety;Cueing for sequencing   Toilet Transfer: Minimal assistance;RW;BSC Toilet Transfer Details (indicate cue type and reason): with increased cues to complete transfers with patient attempting to sit down on arm rest despite multimodal cues to complete turn. Toileting- Clothing Manipulation and Hygiene: Maximal assistance;Sit to/from stand Toileting - Clothing Manipulation Details (indicate cue type and reason): patient needs max A for hygiene while in standing. patient was noted to attempt to use UE to void BM while seated in chair with education on skin risks and hygiene each time x3. nurse made aware.     Functional mobility during ADLs: Minimal assistance;Cueing for safety;Cueing for sequencing General ADL Comments: patients HR was noted to spike to 145 bpm breifly while attempting to void BM. nurse made aware.     Vision Baseline Vision/History: 1 Wears glasses Ability to See in Adequate Light: 1 Impaired Patient Visual Report: No change from baseline                  Pertinent Vitals/Pain Pain Assessment: No/denies pain     Hand Dominance Right   Extremity/Trunk Assessment Upper Extremity Assessment Upper Extremity Assessment: Overall WFL for  tasks assessed   Lower Extremity Assessment Lower Extremity Assessment: Defer to PT evaluation   Cervical / Trunk Assessment Cervical / Trunk Assessment:  Kyphotic   Communication Communication Communication: No difficulties   Cognition Arousal/Alertness: Awake/alert Behavior During Therapy: WFL for tasks assessed/performed Overall Cognitive Status: No family/caregiver present to determine baseline cognitive functioning                                      Home Living Family/patient expects to be discharged to:: Private residence Living Arrangements: Children (daughter) Available Help at Discharge: Family Type of Home: House       Home Layout: Two level;Able to live on main level with bedroom/bathroom Alternate Level Stairs-Number of Steps: flight of steps   Bathroom Shower/Tub: Occupational psychologist: Standard     Home Equipment: Bedside commode;Walker - 2 wheels          Prior Functioning/Environment Level of Independence: Independent with assistive device(s)        Comments: patient reported using rolling walker at home for mobility.        OT Problem List: Decreased strength;Impaired balance (sitting and/or standing);Decreased safety awareness;Decreased knowledge of precautions;Decreased knowledge of use of DME or AE;Decreased activity tolerance      OT Treatment/Interventions: Self-care/ADL training;Therapeutic exercise;DME and/or AE instruction;Therapeutic activities;Balance training;Patient/family education    OT Goals(Current goals can be found in the care plan section) Acute Rehab OT Goals Patient Stated Goal: to get BM out OT Goal Formulation: With patient Time For Goal Achievement: 03/31/21 Potential to Achieve Goals: Good  OT Frequency: Min 2X/week    AM-PAC OT "6 Clicks" Daily Activity     Outcome Measure Help from another person eating meals?: A Little Help from another person taking care of personal grooming?: A Little Help from another person toileting, which includes using toliet, bedpan, or urinal?: A Lot Help from another person bathing (including washing, rinsing,  drying)?: A Lot Help from another person to put on and taking off regular upper body clothing?: A Little Help from another person to put on and taking off regular lower body clothing?: A Lot 6 Click Score: 15   End of Session Equipment Utilized During Treatment: Gait belt Nurse Communication: Mobility status  Activity Tolerance: Patient tolerated treatment well Patient left: in chair;with call bell/phone within reach;with nursing/sitter in room  OT Visit Diagnosis: Unsteadiness on feet (R26.81);Muscle weakness (generalized) (M62.81)                Time: 7530-0511 OT Time Calculation (min): 31 min Charges:  OT General Charges $OT Visit: 1 Visit OT Evaluation $OT Eval Low Complexity: 1 Low OT Treatments $Self Care/Home Management : 8-22 mins  Jackelyn Poling OTR/L, MS Acute Rehabilitation Department Office# [phone] Pager# [phone]   [place] 03/17/2021, 12:27 PM

## 2021-03-17 NOTE — Progress Notes (Signed)
Echocardiogram 2D Echocardiogram has been performed.  Oneal Deputy Nhan Qualley RDCS 03/17/2021, 11:09 AM

## 2021-03-17 NOTE — Plan of Care (Signed)
  Problem: Nutrition: Goal: Adequate nutrition will be maintained Outcome: Progressing   Problem: Coping: Goal: Level of anxiety will decrease Outcome: Progressing   Problem: Pain Managment: Goal: General experience of comfort will improve Outcome: Progressing   Problem: Safety: Goal: Ability to remain free from injury will improve Outcome: Progressing

## 2021-03-17 NOTE — Evaluation (Signed)
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) dx'd 1994, 2011   1994 left, 2011 right, back tumor 2015   Osteoporosis    S/P radiation therapy 9/26-16-03/20/15   SBRT LUL 54 Gy/48fx   Urinary frequency    Wears glasses    Past Surgical History:  Past Surgical History:  Procedure Laterality Date   ADRENAL GLAND SURGERY  1990/2011   CATARACT EXTRACTION, BILATERAL Bilateral    COLONOSCOPY  multiple   ESOPHAGOGASTRODUODENOSCOPY     ESOPHAGOGASTRODUODENOSCOPY N/A 09/16/2014   Procedure: ESOPHAGOGASTRODUODENOSCOPY (EGD);  Surgeon: [name], MD;  Location: [facility];  Service: Endoscopy;  Laterality: N/A;   EYE SURGERY     HOT HEMOSTASIS N/A 09/16/2014   Procedure: HOT HEMOSTASIS (ARGON PLASMA COAGULATION/BICAP);  Surgeon: [name], MD;  Location: [facility];  Service: Endoscopy;  Laterality: N/A;   LUNG CANCER SURGERY  08/2009   malignant tumor from back  June 2015   at [facility] "related to adrenal gland cancer" per pt   TUBAL LIGATION     [place] N/A 02/06/2015   Procedure: VIDEO BRONCHOSCOPY WITH ENDOBRONCHIAL NAVIGATION;  Surgeon: Grace Isaac, MD;  Location: [place];  Service: Thoracic;  Laterality: N/A;   VIDEO BRONCHOSCOPY WITH  ENDOBRONCHIAL ULTRASOUND N/A 10/18/2014   Procedure: VIDEO BRONCHOSCOPY WITH ENDOBRONCHIAL ULTRASOUND;  Surgeon: Grace Isaac, MD;  Location: [place];  Service: Thoracic;  Laterality: N/A;   VIDEO BRONCHOSCOPY WITH ENDOBRONCHIAL ULTRASOUND N/A 02/06/2015   Procedure: VIDEO BRONCHOSCOPY WITH ENDOBRONCHIAL ULTRASOUND;  Surgeon: Grace Isaac, MD;  Location: [facility];  Service: Thoracic;  Laterality: N/A;   HPI:  Patient is a 86 year old female who presented to the hospital on 9/30 with decreased oral intake and increased weakness. Patient was found to have CAP v.s. aspiration pneumonia, new onset of A fib and non STEMI. 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    Assessment / Plan / Recommendation  Clinical Impression  Pt was seen for a bedside swallow evaluation in the setting of possible aspiration PNA and she presents with suspected functional oropharyngeal swallowing abilities.  Pt was encountered awake/alert with daughter present.  Oral mechanism exam was unremarkable.  Pt consumed trials of thin liquid, puree, and regular solids.  Pt fed herself given assistance, and she demonstrated good bolus acceptance, timely mastication, suspected timely AP transport/swallow initiation, and consistent hyolaryngeal elevation/excursion to observation and palpation.  No overt s/sx of aspiration were observed with any trials.  Recommend initiation of regular solids and thin liquids with medications administered whole with liquid.  No further skilled ST is warranted at this time targeting dysphagia.  SLP will f/u for treatment targeting cognitive deficits.   SLP Visit Diagnosis: Dysphagia, unspecified (R13.10)    Aspiration Risk  No limitations    Diet Recommendation Regular;Thin liquid   Liquid Administration via: Cup;Straw Medication Administration: Whole meds with liquid Supervision: Patient able to self feed;Intermittent supervision to  cue for compensatory strategies Compensations: Slow rate;Small sips/bites Postural Changes: Seated upright at 90 degrees    Other  Recommendations Oral Care Recommendations: Oral care BID    Recommendations for follow up therapy are one component of a multi-disciplinary discharge planning process, led by the attending physician.  Recommendations may be updated based on patient status, additional functional criteria and insurance authorization.  Follow up Recommendations None (for dysphagia.  See previous note for cognitive recommendations)        Swallow Study   General HPI: Patient is a 86 year old female who presented to the hosptial on 9/30 with decreased oral intake and increased weakness. Patient was found to have CAP v.s. aspiration pneumonia, new onset of A fib and non STEMI. 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 Type of Study: Bedside Swallow Evaluation Previous Swallow Assessment: N/A Diet Prior to this Study: Thin liquids Temperature Spikes Noted: No Respiratory Status: Room air History of Recent Intubation: No Behavior/Cognition: Alert;Cooperative;Pleasant mood Oral Cavity Assessment: Within Functional Limits Oral Care Completed by SLP: No Oral Cavity - Dentition: Adequate natural dentition Vision: Functional for self-feeding Self-Feeding Abilities: Able to feed self Patient Positioning: Upright in bed Baseline Vocal Quality: Normal Volitional Swallow: Able to elicit    Oral/Motor/Sensory Function Overall Oral Motor/Sensory Function: Within functional limits   Ice Chips Ice chips: Not tested   Thin Liquid Thin Liquid: Within functional limits    Nectar Thick Nectar Thick Liquid: Not tested   Honey Thick Honey Thick Liquid: Not tested   Puree Puree: Within functional limits Presentation: Spoon   Solid     Solid: Within functional limits Presentation: Vicie Mutters., M.S., [place] Office: [phone]  Elvia Collum Carey Lafon 03/17/2021,3:51 PM

## 2021-03-17 NOTE — Progress Notes (Addendum)
DAILY PROGRESS NOTE   Patient Name: Leslie Petersen Date of Encounter: 03/17/2021 Cardiologist: Minus Breeding, MD  Chief Complaint   Back pain  Patient Profile   Leslie Petersen is a 86 y.o. female without a prior cardiac history who is being seen today for the evaluation of atrial fib at the request of Dr. Grandville Silos.  Subjective   Seen in consultation by Dr. Percival Spanish yesterday for afib. Converted to sinus rhythm yesterday - stable. Troponin, however, noted to be elevated at 3493, then 3035 (trending down). This suggests possible out of hospital MI - may explain her confusion - she denies any recent or current chest pain, however. CT chest from 04/2020 ordered by oncology demonstrates 3 vessel CAD with aortic atherosclerosis - this was again seen on her CT renal stone study this admission.  Objective   Vitals:   03/17/21 0500 03/17/21 0600 03/17/21 0800 03/17/21 0825  BP: (!) 102/43 (!) 123/55    Pulse: 79 86    Resp: (!) 35 (!) 24    Temp:   (!) 96.8 F (36 C)   TempSrc:   Axillary   SpO2: 96% 94%  94%  Weight:        Intake/Output Summary (Last 24 hours) at 03/17/2021 1000 Last data filed at 03/17/2021 0503 Gross per 24 hour  Intake 2070.7 ml  Output 350 ml  Net 1720.7 ml   Filed Weights   03/15/2021 1800  Weight: 48.5 kg    Physical Exam   General appearance: alert, appears stated age, and no distress Neck: no carotid bruit, no JVD, and thyroid not enlarged, symmetric, no tenderness/mass/nodules Lungs: diminished breath sounds bilaterally Heart: regular rate and rhythm, S1, S2 normal, no murmur, click, rub or gallop Abdomen: soft, non-tender; bowel sounds normal; no masses,  no organomegaly Extremities: extremities normal, atraumatic, no cyanosis or edema Pulses: 2+ and symmetric Skin: pale, warm, dry Neurologic: Alert and oriented X 3, normal strength and tone. Normal symmetric reflexes. Normal coordination and gait Psych: Pleasant  Inpatient Medications     Scheduled Meds:  aspirin EC  81 mg Oral Daily   Chlorhexidine Gluconate Cloth  6 each Topical Daily   fludrocortisone  0.1 mg Oral Q24H   gabapentin  300 mg Oral QHS   guaiFENesin  1,200 mg Oral BID   hydrocortisone  10 mg Oral Q24H   hydrocortisone  5 mg Oral Q24H   ipratropium  0.5 mg Nebulization BID   latanoprost  1 drop Both Eyes QHS   levalbuterol  0.63 mg Nebulization BID   pantoprazole sodium  40 mg Oral Daily   sodium chloride flush  3 mL Intravenous Q12H    Continuous Infusions:  sodium chloride 100 mL/hr at 03/17/21 0854   diltiazem (CARDIZEM) infusion 5 mg/hr (03/17/21 0503)   piperacillin-tazobactam (ZOSYN)  IV 3.375 g (03/17/21 0603)    PRN Meds: acetaminophen **OR** acetaminophen, gabapentin, ondansetron **OR** ondansetron (ZOFRAN) IV, polyethylene glycol, sorbitol   Labs   Results for orders placed or performed during the hospital encounter of 03/15/2021 (from the past 48 hour(s))  Lactic acid, plasma     Status: Abnormal   Collection Time: 03/06/2021  2:16 PM  Result Value Ref Range   Lactic Acid, Venous 2.4 (HH) 0.5 - 1.9 mmol/L    Comment: CRITICAL RESULT CALLED TO, READ BACK BY AND VERIFIED WITH: LEONARD,S. RN @1530  ON 03/13/2021 BY COHEN,K Performed at [facility], [place] [address] [place], [place] [address]   Comprehensive metabolic panel  Status: Abnormal   Collection Time: 02/16/2021  2:16 PM  Result Value Ref Range   Sodium 145 135 - 145 mmol/L   Potassium 3.2 (L) 3.5 - 5.1 mmol/L   Chloride 110 98 - 111 mmol/L   CO2 26 22 - 32 mmol/L   Glucose, Bld 115 (H) 70 - 99 mg/dL    Comment: Glucose reference range applies only to samples taken after fasting for at least 8 hours.   BUN 15 8 - 23 mg/dL   Creatinine, Ser 0.74 0.44 - 1.00 mg/dL   Calcium 8.7 (L) 8.9 - 10.3 mg/dL   Total Protein 6.2 (L) 6.5 - 8.1 g/dL   Albumin 2.9 (L) 3.5 - 5.0 g/dL   AST 36 15 - 41 U/L   ALT 21 0 - 44 U/L   Alkaline Phosphatase 48 38 - 126 U/L    Total Bilirubin 1.2 0.3 - 1.2 mg/dL   GFR, Estimated >60 >60 mL/min    Comment: (NOTE) Calculated using the CKD-EPI Creatinine Equation (2021)    Anion gap 9 5 - 15    Comment: Performed at [facility], [place] [address] [place], [place] [address]  CBC WITH DIFFERENTIAL     Status: Abnormal   Collection Time: 03/12/2021  2:16 PM  Result Value Ref Range   WBC 8.4 4.0 - 10.5 K/uL   RBC 2.92 (L) 3.87 - 5.11 MIL/uL   Hemoglobin 10.6 (L) 12.0 - 15.0 g/dL   HCT 32.7 (L) 36.0 - 46.0 %   MCV 112.0 (H) 80.0 - 100.0 fL   MCH 36.3 (H) 26.0 - 34.0 pg   MCHC 32.4 30.0 - 36.0 g/dL   RDW 13.2 11.5 - 15.5 %   Platelets 147 (L) 150 - 400 K/uL   nRBC 0.9 (H) 0.0 - 0.2 %   Neutrophils Relative % 58 %   Neutro Abs 4.9 1.7 - 7.7 K/uL   Lymphocytes Relative 24 %   Lymphs Abs 2.1 0.7 - 4.0 K/uL   Monocytes Relative 15 %   Monocytes Absolute 1.3 (H) 0.1 - 1.0 K/uL   Eosinophils Relative 1 %   Eosinophils Absolute 0.1 0.0 - 0.5 K/uL   Basophils Relative 1 %   Basophils Absolute 0.1 0.0 - 0.1 K/uL   Immature Granulocytes 1 %   Abs Immature Granulocytes 0.04 0.00 - 0.07 K/uL    Comment: Performed at [facility], [place] [address] [place], [place] [address]  Protime-INR     Status: Abnormal   Collection Time: 03/10/2021  2:16 PM  Result Value Ref Range   Prothrombin Time 16.2 (H) 11.4 - 15.2 seconds   INR 1.3 (H) 0.8 - 1.2    Comment: (NOTE) INR goal varies based on device and disease states. Performed at [facility], [place] [address] [place], [place] [address]   APTT     Status: None   Collection Time: 03/12/2021  2:16 PM  Result Value Ref Range   aPTT 35 24 - 36 seconds    Comment: Performed at [facility], [place] [address] [place], [place] [address]  Blood culture (routine single)     Status: None (Preliminary result)   Collection Time: 03/11/2021  2:17 PM   Specimen: BLOOD  Result Value Ref Range   Specimen Description       BLOOD SITE NOT SPECIFIED Performed at [facility], [place] [address], [place],  [address]    Special Requests  BOTTLES DRAWN AEROBIC AND ANAEROBIC Blood Culture results may not be optimal due to an excessive volume of blood received in culture bottles Performed at [facility], [place] [address], [place], [place] [address]    Culture      NO GROWTH < 12 HOURS Performed at [place] [address] [place], [place] [address]    Report Status PENDING   Lactic acid, plasma     Status: None   Collection Time: 03/11/2021  3:49 PM  Result Value Ref Range   Lactic Acid, Venous 1.3 0.5 - 1.9 mmol/L    Comment: Performed at [facility], [place] [address], [place], [place] [address]  Resp Panel by RT-PCR (Flu A&B, Covid) Nasopharyngeal Swab     Status: None   Collection Time: 03/14/2021  5:36 PM   Specimen: Nasopharyngeal Swab; Nasopharyngeal(NP) swabs in vial transport medium  Result Value Ref Range   SARS Coronavirus 2 by RT PCR NEGATIVE NEGATIVE    Comment: (NOTE) SARS-CoV-2 target nucleic acids are NOT DETECTED.  The SARS-CoV-2 RNA is generally detectable in upper respiratory specimens during the acute phase of infection. The lowest concentration of SARS-CoV-2 viral copies this assay can detect is 138 copies/mL. A negative result does not preclude SARS-Cov-2 infection and should not be used as the sole basis for treatment or other patient management decisions. A negative result may occur with  improper specimen collection/handling, submission of specimen other than nasopharyngeal swab, presence of viral mutation(s) within the areas targeted by this assay, and inadequate number of viral copies(<138 copies/mL). A negative result must be combined with clinical observations, patient history, and epidemiological information. The expected result is Negative.  Fact Sheet for Patients:  EntrepreneurPulse.com.au  Fact  Sheet for Healthcare Providers:  IncredibleEmployment.be  This test is no t yet approved or cleared by the [place] FDA and  has been authorized for detection and/or diagnosis of SARS-CoV-2 by FDA under an Emergency Use Authorization (EUA). This EUA will remain  in effect (meaning this test can be used) for the duration of the COVID-19 declaration under Section 564(b)(1) of the Act, 21 U.S.C.section 360bbb-3(b)(1), unless the authorization is terminated  or revoked sooner.       Influenza A by PCR NEGATIVE NEGATIVE   Influenza B by PCR NEGATIVE NEGATIVE    Comment: (NOTE) The Xpert Xpress SARS-CoV-2/FLU/RSV plus assay is intended as an aid in the diagnosis of influenza from Nasopharyngeal swab specimens and should not be used as a sole basis for treatment. Nasal washings and aspirates are unacceptable for Xpert Xpress SARS-CoV-2/FLU/RSV testing.  Fact Sheet for Patients: EntrepreneurPulse.com.au  Fact Sheet for Healthcare Providers: IncredibleEmployment.be  This test is not yet approved or cleared by the [place] FDA and has been authorized for detection and/or diagnosis of SARS-CoV-2 by FDA under an Emergency Use Authorization (EUA). This EUA will remain in effect (meaning this test can be used) for the duration of the COVID-19 declaration under Section 564(b)(1) of the Act, 21 U.S.C. section 360bbb-3(b)(1), unless the authorization is terminated or revoked.  Performed at [facility], [place] [address], [place], [place] [address]   Culture, blood (routine x 2) Call MD if unable to obtain prior to antibiotics being given     Status: None (Preliminary result)   Collection Time: 02/22/2021  7:14 PM   Specimen: BLOOD  Result Value Ref Range   Specimen Description      BLOOD RIGHT ANTECUBITAL Performed at [facility], [place] [address] [place], [place] [address]    Special  Requests      BOTTLES DRAWN AEROBIC AND ANAEROBIC Blood Culture adequate volume Performed at [place] [address], [place], [place] [address]    Culture      NO GROWTH < 12 HOURS Performed at [place] [address] [place], [place] [address]    Report Status PENDING   Magnesium     Status: None   Collection Time: 03/15/2021  7:15 PM  Result Value Ref Range   Magnesium 1.7 1.7 - 2.4 mg/dL    Comment: Performed at [facility], [place] [address] [place], [place] [address]  HIV Antibody (routine testing w rflx)     Status: None   Collection Time: 02/21/2021  7:15 PM  Result Value Ref Range   HIV Screen 4th Generation wRfx Non Reactive Non Reactive    Comment: Performed at [facility], [place] [address] [place], [place] [address]  Troponin I (High Sensitivity)     Status: Abnormal   Collection Time: 03/06/2021  7:15 PM  Result Value Ref Range   Troponin I (High Sensitivity) 3,493 (HH) <18 ng/L    Comment: CRITICAL RESULT CALLED TO, READ BACK BY AND VERIFIED WITH: DAVIS R @2036  BY BATTLET (NOTE) Elevated high sensitivity troponin I (hsTnI) values and significant  changes across serial measurements may suggest ACS but many other  chronic and acute conditions are known to elevate hsTnI results.  Refer to the Links section for chest pain algorithms and additional  guidance. Performed at [facility], [place] [address], [place], [place] [address]   Procalcitonin - Baseline     Status: None   Collection Time: 02/23/2021  7:15 PM  Result Value Ref Range   Procalcitonin <0.10 ng/mL    Comment:        Interpretation: PCT (Procalcitonin) <= 0.5 ng/mL: Systemic infection (sepsis) is not likely. Local bacterial infection is possible. (NOTE)       Sepsis PCT Algorithm           Lower Respiratory Tract                                      Infection PCT Algorithm    ----------------------------      ----------------------------         PCT < 0.25 ng/mL                PCT < 0.10 ng/mL          Strongly encourage             Strongly discourage   discontinuation of antibiotics    initiation of antibiotics    ----------------------------     -----------------------------       PCT 0.25 - 0.50 ng/mL            PCT 0.10 - 0.25 ng/mL               OR       >80% decrease in PCT            Discourage initiation of  antibiotics      Encourage discontinuation           of antibiotics    ----------------------------     -----------------------------         PCT >= 0.50 ng/mL              PCT 0.26 - 0.50 ng/mL               AND        <80% decrease in PCT             Encourage initiation of                                             antibiotics       Encourage continuation           of antibiotics    ----------------------------     -----------------------------        PCT >= 0.50 ng/mL                  PCT > 0.50 ng/mL               AND         increase in PCT                  Strongly encourage                                      initiation of antibiotics    Strongly encourage escalation           of antibiotics                                     -----------------------------                                           PCT <= 0.25 ng/mL                                                 OR                                        > 80% decrease in PCT                                      Discontinue / Do not initiate                                             antibiotics  Performed at [place] [address] [place], [place] [address]   Vitamin B12     Status: Abnormal   Collection Time:  03/04/2021  7:15 PM  Result Value Ref Range   Vitamin B-12 2,239 (H) 180 - 914 pg/mL    Comment: RESULTS CONFIRMED BY MANUAL DILUTION (NOTE) This assay is not validated for testing neonatal or myeloproliferative syndrome specimens  for Vitamin B12 levels. Performed at [facility], [place] [address], [place], [place] [address]   Folate     Status: None   Collection Time: 03/12/2021  7:15 PM  Result Value Ref Range   Folate 26.9 >5.9 ng/mL    Comment: RESULTS CONFIRMED BY MANUAL DILUTION Performed at [place] [address] [place], [place] [address]   Iron and TIBC     Status: Abnormal   Collection Time: 03/13/2021  7:15 PM  Result Value Ref Range   Iron 31 28 - 170 ug/dL   TIBC 169 (L) 250 - 450 ug/dL   Saturation Ratios 18 10.4 - 31.8 %   UIBC 138 ug/dL    Comment: Performed at [facility], [place] [address] [place], [place] [address]  Ferritin     Status: Abnormal   Collection Time: 02/15/2021  7:15 PM  Result Value Ref Range   Ferritin 380 (H) 11 - 307 ng/mL    Comment: Performed at [facility], [place] [address], [place], [place] [address]  Reticulocytes     Status: Abnormal   Collection Time: 03/08/2021  7:15 PM  Result Value Ref Range   Retic Ct Pct 1.5 0.4 - 3.1 %   RBC. 2.74 (L) 3.87 - 5.11 MIL/uL   Retic Count, Absolute 42.2 19.0 - 186.0 K/uL   Immature Retic Fract 20.3 (H) 2.3 - 15.9 %    Comment: Performed at [facility], [place] [address], [place], [place] [address]  TSH     Status: None   Collection Time: 03/02/2021  7:15 PM  Result Value Ref Range   TSH 2.628 0.350 - 4.500 uIU/mL    Comment: Performed by a 3rd Generation assay with a functional sensitivity of <=0.01 uIU/mL. Performed at [facility], [place] [address], [place], [place] [address]   Culture, blood (routine x 2) Call MD if unable to obtain prior to antibiotics being given     Status: None (Preliminary result)   Collection Time: 03/05/2021  7:16 PM   Specimen: BLOOD LEFT HAND  Result Value Ref Range   Specimen Description      BLOOD LEFT HAND Performed at [place] [address], [place], [place]  [address]    Special Requests      BOTTLES DRAWN AEROBIC AND ANAEROBIC Blood Culture adequate volume Performed at [place] [address] [place], [place] [address]    Culture      NO GROWTH < 12 HOURS Performed at [place] [address], [place],  [address]    Report Status PENDING   MRSA Next Gen by PCR, Nasal     Status: None   Collection Time: 03/15/2021  8:53 PM   Specimen: Nasal Mucosa; Nasal Swab  Result Value Ref Range   MRSA by PCR Next Gen NOT DETECTED NOT DETECTED    Comment: (NOTE) The GeneXpert MRSA Assay (FDA approved for NASAL specimens only), is one component of a comprehensive MRSA colonization surveillance program. It is not intended to diagnose MRSA infection nor to guide or monitor treatment for MRSA infections. Test performance is not FDA approved in patients less than 2 years  old. Performed at [facility], [place] [address] [place], [place] [address]   Troponin I (High Sensitivity)     Status: Abnormal   Collection Time: 03/15/2021  9:39 PM  Result Value Ref Range   Troponin I (High Sensitivity) 3,035 (HH) <18 ng/L    Comment: CRITICAL VALUE NOTED.  VALUE IS CONSISTENT WITH PREVIOUSLY REPORTED AND CALLED VALUE. (NOTE) Elevated high sensitivity troponin I (hsTnI) values and significant  changes across serial measurements may suggest ACS but many other  chronic and acute conditions are known to elevate hsTnI results.  Refer to the Links section for chest pain algorithms and additional  guidance. Performed at [facility], [place] [address] [place], [place] [address]   Urinalysis, Routine w reflex microscopic Urine, Catheterized     Status: Abnormal   Collection Time: 03/17/21  3:01 AM  Result Value Ref Range   Color, Urine YELLOW YELLOW   APPearance CLEAR CLEAR   Specific Gravity, Urine 1.015 1.005 - 1.030   pH 5.0 5.0 - 8.0   Glucose, UA NEGATIVE NEGATIVE mg/dL   Hgb urine  dipstick SMALL (A) NEGATIVE   Bilirubin Urine NEGATIVE NEGATIVE   Ketones, ur NEGATIVE NEGATIVE mg/dL   Protein, ur NEGATIVE NEGATIVE mg/dL   Nitrite POSITIVE (A) NEGATIVE   Leukocytes,Ua TRACE (A) NEGATIVE   RBC / HPF 0-5 0 - 5 RBC/hpf   WBC, UA >50 (H) 0 - 5 WBC/hpf   Bacteria, UA NONE SEEN NONE SEEN   Mucus PRESENT    Hyaline Casts, UA PRESENT     Comment: Performed at [facility], [place] [address], [place], [place] [address]  Strep pneumoniae urinary antigen     Status: None   Collection Time: 03/17/21  3:01 AM  Result Value Ref Range   Strep Pneumo Urinary Antigen NEGATIVE NEGATIVE    Comment:        Infection due to S. pneumoniae cannot be absolutely ruled out since the antigen present may be below the detection limit of the test. Performed at [facility], [address], [place], [place] [address]   Lactic acid, plasma     Status: None   Collection Time: 03/17/21  3:12 AM  Result Value Ref Range   Lactic Acid, Venous 1.0 0.5 - 1.9 mmol/L    Comment: Performed at [facility], [place] [address], [place],  [address]  Procalcitonin     Status: None   Collection Time: 03/17/21  3:12 AM  Result Value Ref Range   Procalcitonin <0.10 ng/mL    Comment:        Interpretation: PCT (Procalcitonin) <= 0.5 ng/mL: Systemic infection (sepsis) is not likely. Local bacterial infection is possible. (NOTE)       Sepsis PCT Algorithm           Lower Respiratory Tract                                      Infection PCT Algorithm    ----------------------------     ----------------------------         PCT < 0.25 ng/mL                PCT < 0.10 ng/mL          Strongly encourage             Strongly discourage   discontinuation  of antibiotics    initiation of antibiotics    ----------------------------     -----------------------------       PCT 0.25 - 0.50 ng/mL            PCT 0.10 - 0.25 ng/mL               OR       >80% decrease in PCT             Discourage initiation of                                            antibiotics      Encourage discontinuation           of antibiotics    ----------------------------     -----------------------------         PCT >= 0.50 ng/mL              PCT 0.26 - 0.50 ng/mL               AND        <80% decrease in PCT             Encourage initiation of                                             antibiotics       Encourage continuation           of antibiotics    ----------------------------     -----------------------------        PCT >= 0.50 ng/mL                  PCT > 0.50 ng/mL               AND         increase in PCT                  Strongly encourage                                      initiation of antibiotics    Strongly encourage escalation           of antibiotics                                     -----------------------------                                           PCT <= 0.25 ng/mL                                                 OR                                        >  80% decrease in PCT                                      Discontinue / Do not initiate                                             antibiotics  Performed at [place] [address] [place], [place] [address]   Magnesium     Status: None   Collection Time: 03/17/21  3:12 AM  Result Value Ref Range   Magnesium 2.3 1.7 - 2.4 mg/dL    Comment: Performed at [facility], [place] [address], [place], [place] [address]  Basic metabolic panel     Status: Abnormal   Collection Time: 03/17/21  3:12 AM  Result Value Ref Range   Sodium 141 135 - 145 mmol/L   Potassium 4.1 3.5 - 5.1 mmol/L    Comment: NO VISIBLE HEMOLYSIS   Chloride 112 (H) 98 - 111 mmol/L   CO2 25 22 - 32 mmol/L   Glucose, Bld 87 70 - 99 mg/dL    Comment: Glucose reference range applies only to samples taken after fasting for at least 8 hours.   BUN 14 8 - 23 mg/dL   Creatinine, Ser 0.58  0.44 - 1.00 mg/dL   Calcium 7.7 (L) 8.9 - 10.3 mg/dL   GFR, Estimated >60 >60 mL/min    Comment: (NOTE) Calculated using the CKD-EPI Creatinine Equation (2021)    Anion gap 4 (L) 5 - 15    Comment: Performed at [facility], [place] [address] [place], [place] [address]  CBC WITH DIFFERENTIAL     Status: Abnormal   Collection Time: 03/17/21  3:12 AM  Result Value Ref Range   WBC 6.4 4.0 - 10.5 K/uL   RBC 2.71 (L) 3.87 - 5.11 MIL/uL   Hemoglobin 9.8 (L) 12.0 - 15.0 g/dL   HCT 30.0 (L) 36.0 - 46.0 %   MCV 110.7 (H) 80.0 - 100.0 fL   MCH 36.2 (H) 26.0 - 34.0 pg   MCHC 32.7 30.0 - 36.0 g/dL   RDW 13.2 11.5 - 15.5 %   Platelets 114 (L) 150 - 400 K/uL    Comment: Immature Platelet Fraction may be clinically indicated, consider ordering this additional test [id number] PLATELET COUNT CONFIRMED BY SMEAR    nRBC 0.0 0.0 - 0.2 %   Neutrophils Relative % 70 %   Neutro Abs 4.4 1.7 - 7.7 K/uL   Lymphocytes Relative 18 %   Lymphs Abs 1.2 0.7 - 4.0 K/uL   Monocytes Relative 10 %   Monocytes Absolute 0.6 0.1 - 1.0 K/uL   Eosinophils Relative 1 %   Eosinophils Absolute 0.1 0.0 - 0.5 K/uL   Basophils Relative 1 %   Basophils Absolute 0.1 0.0 - 0.1 K/uL   Immature Granulocytes 0 %   Abs Immature Granulocytes 0.02 0.00 - 0.07 K/uL   Polychromasia PRESENT     Comment: Performed at [facility], [place] [address], [place], [place] [address]    ECG   NSR at 81 - Personally Reviewed  Telemetry   Sinus rhythm - Personally Reviewed  Radiology    DG Chest 1 View  Result Date: 02/18/2021 CLINICAL DATA:  Weakness  and right flank pain. EXAM: CHEST  1 VIEW COMPARISON:  Chest x-ray 02/06/2015.  CT chest 05/09/2020. FINDINGS: Postsurgical changes in the left upper lobe appear stable. There is increasing right upper lobe/perihilar airspace disease. There is a band of opacity in the right upper lobe similar to the prior study. A large hiatal hernia is again seen.  There are atherosclerotic calcifications of the aorta. Heart size is within normal limits. The costophrenic angles are clear. There is no pneumothorax. No acute fractures are seen. IMPRESSION: 1. New right upper lobe/perihilar airspace disease. Findings may be related to infection. Other etiologies such as neoplasm cannot be excluded given patient's history. 2. Stable postsurgical changes in the left lung. Electronically Signed   By: Ronney Asters M.D.   On: 02/26/2021 15:06   CT L-SPINE NO CHARGE  Result Date: 03/01/2021 CLINICAL DATA:  Back pain. Worsening weakness for 1 week with right flank pain. EXAM: CT LUMBAR SPINE WITHOUT CONTRAST TECHNIQUE: Multidetector CT imaging of the lumbar spine was performed without intravenous contrast administration. Multiplanar CT image reconstructions were also generated. COMPARISON:  Lumbar spine MRI 09/05/2012 FINDINGS: Segmentation: The lowest fully formed intervertebral disc space is L5-S1. There are no ribs at T12. Alignment: Chronic straightening of the normal lumbar lordosis. No listhesis. Vertebrae: Unchanged chronic T12 and L1 compression fractures. No acute fracture or suspicious osseous lesion. Scattered small Schmorl's nodes. Diffuse osteopenia. Paraspinal and other soft tissues: No acute abnormality identified in the paraspinal soft tissues. Intra-and pelvic contents reported separately. Disc levels: Similar appearance of diffuse lumbar disc and facet degeneration compared to the prior MRI, most notable at L4-5 where there is moderate spinal stenosis, moderate bilateral lateral recess stenosis, and moderate right neural foraminal stenosis. Unchanged moderate left greater than right neural foraminal stenosis at L5-S1. IMPRESSION: 1. No acute osseous abnormality identified in the lumbar spine. 2. Unchanged chronic T12 and L1 compression fractures. 3. Unchanged lumbar disc and facet degeneration. Electronically Signed   By: Logan Bores M.D.   On: 02/23/2021 14:43    CT Renal Stone Study  Result Date: 03/01/2021 CLINICAL DATA:  Flank pain in an 86 year old female for 1 week, difficulty ambulating. EXAM: CT ABDOMEN AND PELVIS WITHOUT CONTRAST TECHNIQUE: Multidetector CT imaging of the abdomen and pelvis was performed following the standard protocol without IV contrast. COMPARISON:  Chest x-ray and lumbar spine evaluation of the same date, CT of the chest, abdomen and pelvis from 2016. FINDINGS: Lower chest: Large hiatal hernia extends into the chest, greater than 50% of the stomach has herniated into the chest as on previous studies. Three-vessel coronary artery disease. No substantial pericardial effusion. Pulmonary emphysema.  No pleural effusion or consolidation. Hepatobiliary: Liver with smooth contours. No pericholecystic stranding. No gross biliary duct distension. Pancreas: Pancreatic atrophy, no signs of inflammation. Spleen: Normal. Adrenals/Urinary Tract: Post bilateral adrenalectomy. Nephrolithiasis on the LEFT, small intrarenal calculi largest in the lower pole measuring approximately 4 mm. No definite ureteral calculi, no signs of hydronephrosis or perinephric stranding. There are numerous phleboliths in the pelvis that displays similar distribution and appearance compared to prior imaging. Urinary bladder is collapsed. Stomach/Bowel: Much of the stomach again is herniated into the chest. No acute small bowel process. Fluid-filled small bowel loops of predominantly distal small bowel without substantial distension and without adjacent stranding. Moderate stool in the rectum without perirectal stranding. Colonic diverticulosis throughout the colon. No signs of colonic inflammation. Appendix not visualized, no secondary signs to suggest appendicitis. Small subcostal abdominal wall laxity contains portion of  the descending colon without sign of obstruction or inflammation. Vascular/Lymphatic: Atheromatous plaque of the abdominal aorta without aneurysmal dilation.  There is no gastrohepatic or hepatoduodenal ligament lymphadenopathy. No retroperitoneal or mesenteric lymphadenopathy. No pelvic sidewall lymphadenopathy. Reproductive: Cystic lesion LEFT adnexa 3.5 x 2.5 x 3.5 cm Other: No free intra-abdominal air.  No ascites. Musculoskeletal: Osteopenia. Spinal degenerative changes. No acute bone finding or destructive bone process. T12 and L1 with loss of height, chronic and better evaluated on dedicated spinal recons, reported separately. IMPRESSION: No acute intra-abdominal pathology.  Post bilateral adrenalectomy. Cystic lesion LEFT adnexa 3.5 x 2.5 x 3.5 cm. 3.5 cm left adnexal simple-appearing cyst, not adequately characterized. Recommend prompt follow-up with pelvic US, this could be performed on a nonemergent basis. Reference: JACR 2020 Feb;17(2):248-254 Large hiatal hernia as on previous imaging. Nephrolithiasis without signs of hydronephrosis or perinephric stranding. Three-vessel coronary artery disease. Aortic Atherosclerosis (ICD10-I70.0) and Emphysema (ICD10-J43.9). Electronically Signed   By: Zetta Bills M.D.   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 Plan for 2D echo today. As she is back in sinus, will d/c diltiazem gtts - switch to metoprolol. Keep off heparin for the moment. NSTEMI - significant troponin elevation which appears to be downtrending - given 3 vessel atherosclerosis, suspect she had out of hospital MI, unlikely related to sepsis - no S/S of CHF -  but silent as far as I can tell. Would continue aspirin 81 mg daily. D/c diltiazem infusion and switch to low dose Toprol XL 12.5 mg daily. Start atorvastatin 80 mg daily - check FLP in am tomorrow. Ultimately, likely to pursue medical therapy at this point given age and that she is asymptomatic, especially if LVEF is preserved CAP - upper lobe, ?Aspiration - Abx per hospital medicine, swallow eval  Time Spent Directly with Patient:  I have spent a total of 35 minutes with the patient reviewing hospital notes, telemetry, EKGs, labs and examining the patient as well as establishing an assessment and plan that was discussed personally with the patient.  > 50% of time was spent in direct patient care.  Length of Stay:  LOS: 1 day   Pixie Casino, MD, [facility], [place] Director of the [facility] Lipid [facility]  [facility] Diplomate of the American Board of Clinical Lipidology Attending Cardiologist  Direct Dial: [phone]  Fax: [phone]  Website:  [email or web address]  Nadean Corwin Buren Havey 03/17/2021, 10:00 AM

## 2021-03-17 NOTE — Progress Notes (Addendum)
PROGRESS NOTE    Leslie Petersen  ZSM:[id number] DOB: 1935-03-29 DOA: 02/16/2021 PCP: Kathyrn Lass, MD    Chief Complaint  Patient presents with   Weakness   Flank Pain    Brief Narrative: Patient 86 year old female with history of chronic low back pain, adrenal cortical tumor status post bilateral resection on Florinef and Cortef, GERD, duodenal adenoma, history of left upper lobe cancer/adenocarcinoma s/p resection presented to the ED with worsening generalized weakness x2 weeks, decreased oral intake, increased weakness.  Patient seen in the ED noted to have elevated lactic acid level, noted to be new onset A. fib requiring Cardizem drip, chest x-ray concerning for right upper lobe pneumonia.  Patient admitted to the stepdown unit, placed empirically on IV antibiotics, urinalysis concerning for UTI.  Patient converted to sinus rhythm on Cardizem drip.  Cardiology consulted.  Patient noted to have a elevated troponin of 3493 with concerns from out of hospital non-STEMI.  2D echo pending.  Cardiology consulted and are following.   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 fib.  Chest x-ray concerning for right upper lobe pneumonia. -Blood cultures with no growth to date.  Repeat lactic acid level normalized.   -Urinalysis concerning for possible UTI.   -Urine strep pneumococcus antigen negative.   -Urine Legionella  antigen pending.  -Procalcitonin levels negative.  -Continue full liquid diet pending SLP evaluation.  -Continue empiric IV Zosyn.  -IV fluids, supportive care.    2.  New onset paroxysmal atrial fibrillation Cha2ds2vasc SCORE 4 -Patient presenting with paroxysmal new onset A. fib likely secondary to probleM #1 versus non-STEMI. -Patient converted to sinus rhythm yesterday. -TSH within normal limits. -2D echo pending. -Patient seen in consultation by cardiology and patient being transition from diltiazem drip to Toprol-XL.Marland Kitchen  -Continue aspirin. -Patient not a good anticoagulation candidate and also due to first episode of paroxysmal atrial fibrillation. -Supportive care.  3.  Non-STEMI -Likely out of hospital non-STEMI. -Patient had presented with A. fib cardiac enzymes drawn noted to be significantly elevated with high-sensitivity troponin at 3493>>> 3035. -Patient denies any ongoing chest pain. -2D echo pending. -Patient seen in consultation by cardiology and patient being transitioned off diltiazem drip to oral beta-blocker with continuation of aspirin.  Patient started on a statin. -Fasting lipid panel in the a.m. -Cardiology following and appreciate input and recommendations.  4.  Dehydration IV fluids.  5.  Lactic acidosis Likely secondary to problem #1 versus dehydration.   -Repeat lactic acid level with resolution of lactic acidosis.   -Continue gentle hydration, IV Zosyn.  6.  Hypokalemia -Magnesium at 1.7 and improved to 2.3 after IV magnesium given.   -Potassium at 4.1.   -Discontinue potassium from IV fluids.   -Repeat labs in the morning.    7.  Status post bilateral adrenalectomy -Continue home regimen of Florinef and Cortef.   -IV fluids.   -Follow.  8.  Generalized weakness -Patient with chronic back pain has had some worsening weakness over the past couple of weeks per daughter with decreased oral intake.  Patient with decreased urine output.  Patient  clinically dry on examination. -Could potentially be secondary to non-STEMI. -On IV fluids.   -Pancultured with cultures pending.   -Continue empiric IV antibiotics.   -Continue medical management for non-STEMI as recommended by cardiology.   -PT/OT.    9.  Left adnexal cyst -Noted on CT renal stone protocol. -We will need pelvic ultrasound and OB/GYN follow-up in the outpatient setting.  10.  Anemia -No overt bleeding. -Anemia panel consistent with anemia of chronic disease.   -Hemoglobin at 9.8 slight drop in hemoglobin likely dilutional.   -Follow H&H.  -Transfusion threshold hemoglobin < 7.  11.  UTI -Urine cultures pending. -Afebrile. -On IV Zosyn.    DVT prophylaxis: Lovenox Code Status: DNR Family Communication: Updated patient and daughter at bedside. Disposition:   Status is: Inpatient  Remains inpatient appropriate because:Inpatient level of care appropriate due to severity of illness  Dispo: The patient is from: Home              Anticipated d/c is to:  TBD              Patient currently is not medically stable to d/c.   Difficult to place patient No       Consultants:  Cardiology: Dr. Percival Spanish 02/25/2021  Procedures:  -CT renal stone protocol 02/15/2021 Chest x-ray 03/01/2021 -2D echo pending -CT L-spine 02/28/2021   Antimicrobials:  IV Zosyn 03/15/2021>>>>   Subjective: Sitting up in bed.  More alert.  Denies any chest pain.  No significant shortness of breath.  Noted to be coughing.  No abdominal pain.  Still with complaints of significant weakness.  Daughter at bedside.  Objective: Vitals:   03/17/21 0600 03/17/21 0800 03/17/21 0825 03/17/21 1021  BP: (!) 123/55   (!) 148/66  Pulse: 86   82  Resp: (!) 24     Temp:  (!) 96.8 F (36 C)    TempSrc:  Axillary    SpO2: 94%  94%   Weight:        Intake/Output Summary (Last 24 hours) at 03/17/2021 1049 Last data filed at 03/17/2021 1003 Gross per 24 hour  Intake 2073.7 ml  Output 350 ml   Net 1723.7 ml   Filed Weights   03/11/2021 1800  Weight: 48.5 kg    Examination:  General exam: Appears calm and comfortable  Respiratory system: Some coarse breath sounds in the right upper lobe.  No crackles.  No wheezing.  Fair air movement.  Speaking in full sentences.   Cardiovascular system: Regular rate rhythm no murmurs rubs or gallops.  No JVD.  No lower extremity edema.  Gastrointestinal system: Abdomen is soft, nontender, nondistended, positive bowel sounds.  No rebound.  No guarding.  Central nervous system: Alert and oriented. No focal neurological deficits. Extremities: Symmetric 5 x 5 power. Skin: No rashes, lesions or ulcers Psychiatry: Judgement and insight appear normal. Mood & affect appropriate.     Data Reviewed: I have personally reviewed following labs and imaging studies  CBC: Recent Labs  Lab 02/15/2021 1416 03/17/21 0312  WBC 8.4 6.4  NEUTROABS 4.9 4.4  HGB 10.6* 9.8*  HCT 32.7* 30.0*  MCV 112.0* 110.7*  PLT 147* 114*    Basic Metabolic Panel: Recent Labs  Lab 03/09/2021 1416 02/17/2021 1915 03/17/21 0312  NA 145  --  141  K 3.2*  --  4.1  CL 110  --  112*  CO2 26  --  25  GLUCOSE 115*  --  87  BUN 15  --  14  CREATININE 0.74  --  0.58  CALCIUM 8.7*  --  7.7*  MG  --  1.7 2.3    GFR: CrCl cannot be calculated (Unknown ideal weight.).  Liver Function Tests: Recent Labs  Lab 02/15/2021 1416  AST 36  ALT 21  ALKPHOS 48  BILITOT 1.2  PROT 6.2*  ALBUMIN 2.9*    CBG: No results for input(s): GLUCAP in the last 168 hours.   Recent Results (from the past 240 hour(s))  Blood culture (routine single)     Status: None (Preliminary result)   Collection Time: 02/27/2021  2:17 PM   Specimen: BLOOD  Result Value Ref Range Status   Specimen Description   Final    BLOOD SITE NOT SPECIFIED Performed at [facility], [address] [place], [place] [address]    Special Requests   Final    BOTTLES DRAWN AEROBIC AND ANAEROBIC Blood  Culture results may not be optimal due to an excessive volume of blood received in culture bottles Performed at [facility] [address] [place], [place] [address]    Culture   Final    NO GROWTH < 12 HOURS Performed at [facility] [address], [place], [place] [address]    Report Status PENDING  Incomplete  Resp Panel by RT-PCR (Flu A&B, Covid) Nasopharyngeal Swab     Status: None   Collection Time: 02/28/2021  5:36 PM   Specimen: Nasopharyngeal Swab; Nasopharyngeal(NP) swabs in vial transport medium  Result Value Ref Range Status   SARS Coronavirus 2 by RT PCR NEGATIVE NEGATIVE Final    Comment: (NOTE) SARS-CoV-2 target nucleic acids are NOT DETECTED.  The SARS-CoV-2 RNA is generally detectable in upper respiratory specimens during the acute phase of infection. The lowest concentration of SARS-CoV-2 viral copies this assay can detect is 138 copies/mL. A negative result does not preclude SARS-Cov-2 infection and should not be used as the sole basis for treatment or other patient management decisions. A negative result may occur with  improper specimen collection/handling, submission of specimen other than nasopharyngeal swab, presence of viral mutation(s) within the areas targeted by this assay, and inadequate number of viral copies(<138 copies/mL). A negative result must be combined with clinical observations, patient history, and epidemiological information. The expected result is Negative.  Fact Sheet for Patients:  EntrepreneurPulse.com.au  Fact Sheet for Healthcare Providers:  IncredibleEmployment.be  This test is no t yet approved or cleared by the [place] FDA and  has been authorized for detection and/or diagnosis of SARS-CoV-2 by FDA under an Emergency Use Authorization (EUA). This EUA will remain  in effect (meaning this test can be used) for the duration of the COVID-19 declaration under  Section 564(b)(1) of the Act, 21 U.S.C.section 360bbb-3(b)(1), unless the authorization is terminated  or revoked sooner.       Influenza A by PCR NEGATIVE NEGATIVE Final   Influenza B by PCR NEGATIVE NEGATIVE Final    Comment: (NOTE) The Xpert Xpress SARS-CoV-2/FLU/RSV plus assay is intended as an aid in the diagnosis of influenza from Nasopharyngeal swab specimens and should not be used as a sole basis for treatment. Nasal washings and aspirates are unacceptable for Xpert Xpress SARS-CoV-2/FLU/RSV testing.  Fact Sheet for Patients: EntrepreneurPulse.com.au  Fact Sheet  for Healthcare Providers: IncredibleEmployment.be  This test is not yet approved or cleared by the [place] and has been authorized for detection and/or diagnosis of SARS-CoV-2 by FDA under an Emergency Use Authorization (EUA). This EUA will remain in effect (meaning this test can be used) for the duration of the COVID-19 declaration under Section 564(b)(1) of the Act, 21 U.S.C. section 360bbb-3(b)(1), unless the authorization is terminated or revoked.  Performed at [facility], [place] [address], [place], [place] [address]   Culture, blood (routine x 2) Call MD if unable to obtain prior to antibiotics being given     Status: None (Preliminary result)   Collection Time: 02/19/2021  7:14 PM   Specimen: BLOOD  Result Value Ref Range Status   Specimen Description   Final    BLOOD RIGHT ANTECUBITAL Performed at [facility] [address], [place], [place] [address]    Special Requests   Final    BOTTLES DRAWN AEROBIC AND ANAEROBIC Blood Culture adequate volume Performed at [facility] [address], [place], [place] [address]    Culture   Final    NO GROWTH < 12 HOURS Performed at [facility] [address] [place], [place] [address]    Report Status PENDING  Incomplete  Culture, blood (routine x  2) Call MD if unable to obtain prior to antibiotics being given     Status: None (Preliminary result)   Collection Time: 03/15/2021  7:16 PM   Specimen: BLOOD LEFT HAND  Result Value Ref Range Status   Specimen Description   Final    BLOOD LEFT HAND Performed at [facility] [address] [place], [place] [address]    Special Requests   Final    BOTTLES DRAWN AEROBIC AND ANAEROBIC Blood Culture adequate volume Performed at [facility] [address], [place], [place] [address]    Culture   Final    NO GROWTH < 12 HOURS Performed at [facility] [address], [place], [place] [address]    Report Status PENDING  Incomplete  MRSA Next Gen by PCR, Nasal     Status: None   Collection Time: 02/16/2021  8:53 PM   Specimen: Nasal Mucosa; Nasal Swab  Result Value Ref Range Status   MRSA by PCR Next Gen NOT DETECTED NOT DETECTED Final    Comment: (NOTE) The GeneXpert MRSA Assay (FDA approved for NASAL specimens only), is one component of a comprehensive MRSA colonization surveillance program. It is not intended to diagnose MRSA infection nor to guide or monitor treatment for MRSA infections. Test performance is not FDA approved in patients less than 29 years old. Performed at [facility], [place] [address], [place], [place] [address]          Radiology Studies: DG Chest 1 View  Result Date: 03/02/2021 CLINICAL DATA:  Weakness and right flank pain. EXAM: CHEST  1 VIEW COMPARISON:  Chest x-ray 02/06/2015.  CT chest 05/09/2020. FINDINGS: Postsurgical changes in the left upper lobe appear stable. There is increasing right upper lobe/perihilar airspace disease. There is a band of opacity in the right upper lobe similar to the prior study. A large hiatal hernia is again seen. There are atherosclerotic calcifications of the aorta. Heart size is within normal limits. The costophrenic angles are clear. There is no pneumothorax. No  acute fractures are seen. IMPRESSION: 1. New right upper lobe/perihilar airspace disease. Findings may  be related to infection. Other etiologies such as neoplasm cannot be excluded given patient's history. 2. Stable postsurgical changes in the left lung. Electronically Signed   By: Ronney Asters M.D.   On: 03/06/2021 15:06   CT L-SPINE NO CHARGE  Result Date: 03/02/2021 CLINICAL DATA:  Back pain. Worsening weakness for 1 week with right flank pain. EXAM: CT LUMBAR SPINE WITHOUT CONTRAST TECHNIQUE: Multidetector CT imaging of the lumbar spine was performed without intravenous contrast administration. Multiplanar CT image reconstructions were also generated. COMPARISON:  Lumbar spine MRI 09/05/2012 FINDINGS: Segmentation: The lowest fully formed intervertebral disc space is L5-S1. There are no ribs at T12. Alignment: Chronic straightening of the normal lumbar lordosis. No listhesis. Vertebrae: Unchanged chronic T12 and L1 compression fractures. No acute fracture or suspicious osseous lesion. Scattered small Schmorl's nodes. Diffuse osteopenia. Paraspinal and other soft tissues: No acute abnormality identified in the paraspinal soft tissues. Intra-and pelvic contents reported separately. Disc levels: Similar appearance of diffuse lumbar disc and facet degeneration compared to the prior MRI, most notable at L4-5 where there is moderate spinal stenosis, moderate bilateral lateral recess stenosis, and moderate right neural foraminal stenosis. Unchanged moderate left greater than right neural foraminal stenosis at L5-S1. IMPRESSION: 1. No acute osseous abnormality identified in the lumbar spine. 2. Unchanged chronic T12 and L1 compression fractures. 3. Unchanged lumbar disc and facet degeneration. Electronically Signed   By: Logan Bores M.D.   On: 02/19/2021 14:43   CT Renal Stone Study  Result Date: 02/20/2021 CLINICAL DATA:  Flank pain in an 86 year old female for 1 week, difficulty ambulating. EXAM: CT ABDOMEN  AND PELVIS WITHOUT CONTRAST TECHNIQUE: Multidetector CT imaging of the abdomen and pelvis was performed following the standard protocol without IV contrast. COMPARISON:  Chest x-ray and lumbar spine evaluation of the same date, CT of the chest, abdomen and pelvis from 2016. FINDINGS: Lower chest: Large hiatal hernia extends into the chest, greater than 50% of the stomach has herniated into the chest as on previous studies. Three-vessel coronary artery disease. No substantial pericardial effusion. Pulmonary emphysema.  No pleural effusion or consolidation. Hepatobiliary: Liver with smooth contours. No pericholecystic stranding. No gross biliary duct distension. Pancreas: Pancreatic atrophy, no signs of inflammation. Spleen: Normal. Adrenals/Urinary Tract: Post bilateral adrenalectomy. Nephrolithiasis on the LEFT, small intrarenal calculi largest in the lower pole measuring approximately 4 mm. No definite ureteral calculi, no signs of hydronephrosis or perinephric stranding. There are numerous phleboliths in the pelvis that displays similar distribution and appearance compared to prior imaging. Urinary bladder is collapsed. Stomach/Bowel: Much of the stomach again is herniated into the chest. No acute small bowel process. Fluid-filled small bowel loops of predominantly distal small bowel without substantial distension and without adjacent stranding. Moderate stool in the rectum without perirectal stranding. Colonic diverticulosis throughout the colon. No signs of colonic inflammation. Appendix not visualized, no secondary signs to suggest appendicitis. Small subcostal abdominal wall laxity contains portion of the descending colon without sign of obstruction or inflammation. Vascular/Lymphatic: Atheromatous plaque of the abdominal aorta without aneurysmal dilation. There is no gastrohepatic or hepatoduodenal ligament lymphadenopathy. No retroperitoneal or mesenteric lymphadenopathy. No pelvic sidewall lymphadenopathy.  Reproductive: Cystic lesion LEFT adnexa 3.5 x 2.5 x 3.5 cm Other: No free intra-abdominal air.  No ascites. Musculoskeletal: Osteopenia. Spinal degenerative changes. No acute bone finding or destructive bone process. T12 and L1 with loss of height, chronic and better evaluated on dedicated spinal recons, reported separately. IMPRESSION: No acute intra-abdominal pathology.  Post bilateral adrenalectomy. Cystic lesion  LEFT adnexa 3.5 x 2.5 x 3.5 cm. 3.5 cm left adnexal simple-appearing cyst, not adequately characterized. Recommend prompt follow-up with pelvic US, this could be performed on a nonemergent basis. Reference: JACR 2020 Feb;17(2):248-254 Large hiatal hernia as on previous imaging. Nephrolithiasis without signs of hydronephrosis or perinephric stranding. Three-vessel coronary artery disease. Aortic Atherosclerosis (ICD10-I70.0) and Emphysema (ICD10-J43.9). Electronically Signed   By: Zetta Bills M.D.   On: 03/13/2021 15:07        Scheduled Meds:  aspirin EC  81 mg Oral Daily   atorvastatin  80 mg Oral QHS   Chlorhexidine Gluconate Cloth  6 each Topical Daily   enoxaparin (LOVENOX) injection  30 mg Subcutaneous Q24H   fludrocortisone  0.1 mg Oral Q24H   gabapentin  300 mg Oral QHS   guaiFENesin  1,200 mg Oral BID   hydrocortisone  10 mg Oral Q24H   hydrocortisone  5 mg Oral Q24H   ipratropium  0.5 mg Nebulization BID   latanoprost  1 drop Both Eyes QHS   levalbuterol  0.63 mg Nebulization BID   metoprolol succinate  12.5 mg Oral Daily   pantoprazole  40 mg Oral Daily   sodium chloride flush  3 mL Intravenous Q12H   Continuous Infusions:  sodium chloride 100 mL/hr at 03/17/21 0854   piperacillin-tazobactam (ZOSYN)  IV Stopped (03/17/21 1003)     LOS: 1 day    Time spent: 40 minutes    Irine Seal, MD [facility]   To contact the attending provider between 7A-7P or the covering provider during after hours 7P-7A, please log into the web site [email or web address] and  access using universal [place] password for that web site. If you do not have the password, please call the hospital operator.  03/17/2021, 10:49 AM

## 2021-03-17 NOTE — Evaluation (Signed)
Physical Therapy Evaluation Patient Details Name: Leslie Petersen MRN: [id number] DOB: 1935-03-24 Today's Date: 03/17/2021  History of Present Illness  Patient is a 86 year old female who presented to the hosptial on 9/30 with decreased oral intake and increased weakness. Patient was found to have CAP v.s. aspiration pneumonia, new onset of A fib and non STEMI. 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 At baseline, pt resides with daughter and is independent with rollator.  Pt even drives and goes out every morning for breakfast.  She denies hx of falls.  Today, pt was able to ambulate 100' with min guard and min fatigue.  Her VSS on RA.  Pt requiring min cues for safety and RW use (typically uses and prefers rollator).  Pt expected to progress well and return home with family - pt and daughter agree.  Pt currently with functional limitations due to the deficits listed below (see PT Problem List). Pt will benefit from skilled PT to increase their independence and safety with mobility to allow discharge to the venue listed below.          Recommendations for follow up therapy are one component of a multi-disciplinary discharge planning process, led by the attending physician.  Recommendations may be updated based on patient status, additional functional criteria and insurance authorization.  Follow Up Recommendations Home health PT;Supervision for mobility/OOB    Equipment Recommendations  None recommended by PT    Recommendations for Other Services       Precautions / Restrictions Precautions Precautions: Fall Restrictions Weight Bearing Restrictions: No      Mobility  Bed Mobility Overal bed mobility: Needs Assistance Bed Mobility: Supine to Sit;Sit to Supine     Supine to sit: Supervision Sit to supine: Supervision        Transfers Overall transfer level:  Needs assistance Equipment used: None;Rolling walker (2 wheeled) Transfers: Sit to/from [facility] Sit to Stand: Min guard Stand pivot transfers: Min guard       General transfer comment: Sit to stand x 4 during session.  Stand pivot to [facility] with HHA  Ambulation/Gait Ambulation/Gait assistance: Min guard Gait Distance (Feet): 100 Feet Assistive device: Rolling walker (2 wheeled) Gait Pattern/deviations: Step-through pattern;Decreased stride length;Trunk flexed Gait velocity: decreased   General Gait Details: Very flexed (due to pain) and RW significantly forward.  Pt used to rollator.  Cued for RW proximity and able to correct but still with trunk flexed (baseline)  Stairs            Wheelchair Mobility    Modified Rankin (Stroke Patients Only)       Balance Overall balance assessment: Needs assistance Sitting-balance support: No upper extremity supported Sitting balance-Leahy Scale: Good     Standing balance support: No upper extremity supported;Bilateral upper extremity supported Standing balance-Leahy Scale: Fair Standing balance comment: Could stand for ADLs with UE support but unable to weight shift wihout UE assist either HHA or RW                             Pertinent Vitals/Pain Pain Assessment: No/denies pain    Home Living Family/patient expects to be discharged to:: Private residence Living Arrangements: Children Available Help at Discharge: Family;Available 24 hours/day Type of Home: House Home Access: Other (comment) (threshold)     Home Layout: Two level;Able to live on main  level with bedroom/bathroom Home Equipment: Walker - 4 wheels;Shower seat      Prior Function Level of Independence: Needs assistance   Gait / Transfers Assistance Needed: Pt ambulated independently with rollator.  Frequently does short community distances ([employer] every morning for breakfast)  ADL's / Homemaking Assistance Needed: Pt  reports independent with ADLS, Light IADLs, and driving  Comments: .     Hand Dominance   Dominant Hand: Right    Extremity/Trunk Assessment   Upper Extremity Assessment Upper Extremity Assessment: Defer to OT evaluation    Lower Extremity Assessment Lower Extremity Assessment: LLE deficits/detail;RLE deficits/detail RLE Deficits / Details: ROM WFL; MMT grossy 4+/5 LLE Deficits / Details: ROM WFL; MMT grossy 4+/5    Cervical / Trunk Assessment Cervical / Trunk Assessment: Kyphotic  Communication   Communication: HOH  Cognition Arousal/Alertness: Awake/alert Behavior During Therapy: WFL for tasks assessed/performed Overall Cognitive Status: Within Functional Limits for tasks assessed                                        General Comments General comments (skin integrity, edema, etc.): VSS.  Daughter present.    Exercises     Assessment/Plan    PT Assessment Patient needs continued PT services  PT Problem List Decreased strength;Decreased mobility;Decreased safety awareness;Decreased range of motion;Decreased activity tolerance;Decreased balance;Decreased knowledge of use of DME;Cardiopulmonary status limiting activity       PT Treatment Interventions DME instruction;Therapeutic activities;Gait training;Therapeutic exercise;Patient/family education;Stair training;Balance training;Functional mobility training    PT Goals (Current goals can be found in the Care Plan section)  Acute Rehab PT Goals Patient Stated Goal: home, agreeable to HHPT PT Goal Formulation: With patient/family Time For Goal Achievement: 03/31/21 Potential to Achieve Goals: Good    Frequency Min 3X/week   Barriers to discharge        Co-evaluation               AM-PAC PT "6 Clicks" Mobility  Outcome Measure Help needed turning from your back to your side while in a flat bed without using bedrails?: A Little Help needed moving from lying on your back to sitting on  the side of a flat bed without using bedrails?: A Little Help needed moving to and from a bed to a chair (including a wheelchair)?: A Little Help needed standing up from a chair using your arms (e.g., wheelchair or bedside chair)?: A Little Help needed to walk in hospital room?: A Little Help needed climbing 3-5 steps with a railing? : A Little 6 Click Score: 18    End of Session Equipment Utilized During Treatment: Gait belt Activity Tolerance: Patient tolerated treatment well Patient left: in bed;with call bell/phone within reach;with bed alarm set Nurse Communication: Mobility status PT Visit Diagnosis: Other abnormalities of gait and mobility (R26.89);Muscle weakness (generalized) (M62.81)    Time: 6629-4765 PT Time Calculation (min) (ACUTE ONLY): 30 min   Charges:   PT Evaluation $PT Eval Low Complexity: 1 Low PT Treatments $Gait Training: 8-22 mins        Abran Richard, PT Acute Rehab Services Pager [phone] [facility] [place] 03/17/2021, 2:47 PM

## 2021-03-17 DEATH — deceased

## 2021-03-18 DIAGNOSIS — I214 Non-ST elevation (NSTEMI) myocardial infarction: Secondary | ICD-10-CM | POA: Diagnosis not present

## 2021-03-18 DIAGNOSIS — N390 Urinary tract infection, site not specified: Secondary | ICD-10-CM | POA: Diagnosis not present

## 2021-03-18 DIAGNOSIS — C7400 Malignant neoplasm of cortex of unspecified adrenal gland: Secondary | ICD-10-CM | POA: Diagnosis not present

## 2021-03-18 DIAGNOSIS — Z515 Encounter for palliative care: Secondary | ICD-10-CM

## 2021-03-18 DIAGNOSIS — I4891 Unspecified atrial fibrillation: Secondary | ICD-10-CM | POA: Diagnosis not present

## 2021-03-18 DIAGNOSIS — R531 Weakness: Secondary | ICD-10-CM | POA: Diagnosis not present

## 2021-03-18 DIAGNOSIS — Z7189 Other specified counseling: Secondary | ICD-10-CM | POA: Diagnosis not present

## 2021-03-18 DIAGNOSIS — N949 Unspecified condition associated with female genital organs and menstrual cycle: Secondary | ICD-10-CM | POA: Diagnosis not present

## 2021-03-18 LAB — CBC WITH DIFFERENTIAL/PLATELET
Abs Immature Granulocytes: 0.02 10*3/uL (ref 0.00–0.07)
Basophils Absolute: 0 10*3/uL (ref 0.0–0.1)
Basophils Relative: 1 %
Eosinophils Absolute: 0.2 10*3/uL (ref 0.0–0.5)
Eosinophils Relative: 3 %
HCT: 29.5 % — ABNORMAL LOW (ref 36.0–46.0)
Hemoglobin: 10 g/dL — ABNORMAL LOW (ref 12.0–15.0)
Immature Granulocytes: 0 %
Lymphocytes Relative: 22 %
Lymphs Abs: 1.3 10*3/uL (ref 0.7–4.0)
MCH: 37 pg — ABNORMAL HIGH (ref 26.0–34.0)
MCHC: 33.9 g/dL (ref 30.0–36.0)
MCV: 109.3 fL — ABNORMAL HIGH (ref 80.0–100.0)
Monocytes Absolute: 0.5 10*3/uL (ref 0.1–1.0)
Monocytes Relative: 9 %
Neutro Abs: 4 10*3/uL (ref 1.7–7.7)
Neutrophils Relative %: 65 %
Platelets: 119 10*3/uL — ABNORMAL LOW (ref 150–400)
RBC: 2.7 MIL/uL — ABNORMAL LOW (ref 3.87–5.11)
RDW: 13.3 % (ref 11.5–15.5)
WBC: 6.1 10*3/uL (ref 4.0–10.5)
nRBC: 0.5 % — ABNORMAL HIGH (ref 0.0–0.2)

## 2021-03-18 LAB — BASIC METABOLIC PANEL
Anion gap: 9 (ref 5–15)
BUN: 12 mg/dL (ref 8–23)
CO2: 22 mmol/L (ref 22–32)
Calcium: 8.2 mg/dL — ABNORMAL LOW (ref 8.9–10.3)
Chloride: 114 mmol/L — ABNORMAL HIGH (ref 98–111)
Creatinine, Ser: 0.54 mg/dL (ref 0.44–1.00)
GFR, Estimated: >60 mL/min (ref >60)
Glucose, Bld: 82 mg/dL (ref 70–99)
Potassium: 3.1 mmol/L — ABNORMAL LOW (ref 3.5–5.1)
Sodium: 145 mmol/L (ref 135–145)

## 2021-03-18 LAB — MAGNESIUM: Magnesium: 2 mg/dL (ref 1.7–2.4)

## 2021-03-18 LAB — LIPID PANEL
Cholesterol: 83 mg/dL (ref 0–200)
HDL: 33 mg/dL — ABNORMAL LOW (ref >40)
LDL Cholesterol: 39 mg/dL (ref 0–99)
Total CHOL/HDL Ratio: 2.5 RATIO
Triglycerides: 56 mg/dL (ref <150)
VLDL: 11 mg/dL (ref 0–40)

## 2021-03-18 LAB — URINE CULTURE: Culture: <10000 — AB

## 2021-03-18 MED ORDER — GUAIFENESIN 100 MG/5ML PO SOLN
5.0000 mL | ORAL | Status: DC | PRN
  Filled 2021-03-18: qty 5

## 2021-03-18 MED ORDER — ACETAMINOPHEN 500 MG PO TABS
500.0000 mg | ORAL_TABLET | Freq: Three times a day (TID) | ORAL | Status: DC
  Administered 2021-03-18 – 2021-03-19 (×5): 500 mg via ORAL
  Filled 2021-03-18 (×5): qty 1

## 2021-03-18 MED ORDER — POTASSIUM CHLORIDE 20 MEQ PO PACK
40.0000 meq | PACK | Freq: Once | ORAL | Status: AC
  Administered 2021-03-18: 40 meq via ORAL
  Filled 2021-03-18: qty 2

## 2021-03-18 MED ORDER — PHENAZOPYRIDINE HCL 100 MG PO TABS
100.0000 mg | ORAL_TABLET | Freq: Three times a day (TID) | ORAL | Status: DC
  Administered 2021-03-18 – 2021-03-20 (×5): 100 mg via ORAL
  Filled 2021-03-18 (×8): qty 1

## 2021-03-18 MED ORDER — QUETIAPINE FUMARATE 25 MG PO TABS
25.0000 mg | ORAL_TABLET | Freq: Every evening | ORAL | Status: DC | PRN
  Administered 2021-03-19: 25 mg via ORAL
  Filled 2021-03-18 (×2): qty 1

## 2021-03-18 MED ORDER — TRAMADOL HCL 50 MG PO TABS
50.0000 mg | ORAL_TABLET | Freq: Four times a day (QID) | ORAL | Status: DC | PRN
  Administered 2021-03-18: 50 mg via ORAL
  Filled 2021-03-18: qty 1

## 2021-03-18 MED ORDER — KETOROLAC TROMETHAMINE 30 MG/ML IJ SOLN
15.0000 mg | Freq: Four times a day (QID) | INTRAMUSCULAR | Status: DC | PRN
  Administered 2021-03-18: 15 mg via INTRAVENOUS
  Filled 2021-03-18: qty 1

## 2021-03-18 MED ORDER — PROCHLORPERAZINE EDISYLATE 10 MG/2ML IJ SOLN
10.0000 mg | Freq: Four times a day (QID) | INTRAMUSCULAR | Status: DC | PRN
  Administered 2021-03-18: 10 mg via INTRAVENOUS
  Filled 2021-03-18: qty 2

## 2021-03-18 NOTE — Plan of Care (Signed)

## 2021-03-18 NOTE — Progress Notes (Signed)
Daughter called I talked to her at length reference concerns as to if mother would be discharge home. She stated her mother had "friends" who said she could not take appropriate care for her at home and she would need to to go into care facility, at least for a period of time. The daughter was very anxious and verbalized concerns as to process. I explained her mother has a case Freight forwarder and together with the doctors and the daughter would determine the best plan for her discharge. She verbalized that she works full time but possibly could take some time off if necessary. She would like to talk to Case Manager to better understand the process and possibilities. I assured her we would all work together to assure her mother got the best care and to meet all her needs.

## 2021-03-18 NOTE — TOC Initial Note (Addendum)
Transition of Care [facility]) - Initial/Assessment Note    Patient Details  Name: Leslie Petersen MRN: [id number] Date of Birth: July 03, 1934  Transition of [facility]) CM/SW Contact:    Leeroy Cha, RN Phone Number: 03/18/2021, 12:27 PM  Clinical Narrative:                 Request for hhc rn,pt,ot and aide went to [place] with [facility].  Unable to take due to staffing. Request sent to [facility]. Out of network Request sent to [facility]- corry will check and call back. Unable to take due to staffing  Request via telephone sent ti [facility]. Unable to take due due to staffing [facility]-message left on recorder with name and number and patient information. Expected Discharge Plan: [place] Barriers to Discharge: No Barriers Identified   Patient Goals and CMS Choice Patient states their goals for this hospitalization and ongoing recovery are:: TO [place] CMS Medicare.gov Compare Post Acute Care list provided to:: Patient Choice offered to / list presented to : Patient  Expected Discharge Plan and Services Expected Discharge Plan: [place]   Discharge Planning Services: CM Consult Post Acute Care Choice: [place] arrangements for the past 2 months: Single Family Home                           HH Arranged: RN, PT, OT, Nurse's Aide HH Agency: [place] Date [place]: 03/18/21 Time HH Agency Contacted: 31 Representative spoke with at [place] Arrangements/Services Living arrangements for the past 2 months: [place] with:: Self Patient language and need for interpreter reviewed:: Yes Do you feel safe going back to the place where you live?: Yes            Criminal Activity/Legal Involvement Pertinent to Current Situation/Hospitalization: No - Comment as needed  Activities of Daily Living Home Assistive Devices/Equipment: Hearing aid, Eyeglasses (wears 2  hearing aides) ADL Screening (condition at time of admission) Patient's cognitive ability adequate to safely complete daily activities?: No Is the patient deaf or have difficulty hearing?: Yes Does the patient have difficulty seeing, even when wearing glasses/contacts?: No Does the patient have difficulty concentrating, remembering, or making decisions?: Yes (recently per daughter) Patient able to express need for assistance with ADLs?: Yes Does the patient have difficulty dressing or bathing?: Yes Independently performs ADLs?: No Communication: Independent Dressing (OT): Independent Grooming: Independent Feeding: Independent Bathing: Independent Toileting: Needs assistance Is this a change from baseline?: Pre-admission baseline In/Out Bed: Needs assistance Is this a change from baseline?: Pre-admission baseline Walks in Home: Needs assistance Is this a change from baseline?: Pre-admission baseline Does the patient have difficulty walking or climbing stairs?: Yes Weakness of Legs: Both Weakness of Arms/Hands: Both  Permission Sought/Granted                  Emotional Assessment Appearance:: Appears stated age     Orientation: : Oriented to Self, Oriented to Place, Oriented to  Time, Oriented to Situation Alcohol / Substance Use: Not Applicable Psych Involvement: No (comment)  Admission diagnosis:  Back pain [M54.9] New onset atrial fibrillation ([place]) [I48.91] Chest pain [R07.9] Sepsis [facility]) [A41.9] Patient Active Problem List   Diagnosis Date Noted   Non-ST elevation (NSTEMI) myocardial infarction ([place]) 03/17/2021   Acute lower UTI 03/17/2021   Sepsis ([place]) 03/10/2021   New onset atrial fibrillation ([place])  03/14/2021   Chronic back pain 03/07/2021   Generalized weakness 02/18/2021   CAP (community acquired pneumonia) 03/15/2021   Dehydration 03/13/2021   Hypokalemia 03/02/2021   Anemia 03/08/2021   Adnexal cyst 02/20/2021   Primary cancer of left upper lobe of  lung ([place]) 03/29/2015   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   Osteoporosis 02/17/2014   Atypical chest pain 02/17/2014   Hypotension 02/17/2014   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

## 2021-03-18 NOTE — Progress Notes (Signed)
DAILY PROGRESS NOTE   Patient Name: Leslie Petersen Date of Encounter: 03/18/2021 Cardiologist: Minus Breeding, MD  Chief Complaint   No complaints  Patient Profile   Leslie Petersen is a 86 y.o. female without a prior cardiac history who is being seen today for the evaluation of atrial fib at the request of Dr. Grandville Silos.  Subjective   Echo yesterday shows hyperdynamic LVEF 65-70%, no regional WMA's, mild AI and mild MR. Hypokalemic today at 3.1. Cholesterol is low at LDL 39.  Objective   Vitals:   03/18/21 0500 03/18/21 0504 03/18/21 0726 03/18/21 0800  BP:  (!) 124/48    Pulse: 72 79    Resp: (!) 23 20    Temp:    98.1 F (36.7 C)  TempSrc:    Axillary  SpO2: 92% 95% 92%   Weight: 55.1 kg     Height:        Intake/Output Summary (Last 24 hours) at 03/18/2021 2536 Last data filed at 03/18/2021 0400 Gross per 24 hour  Intake 1569.9 ml  Output 2 ml  Net 1567.9 ml   Filed Weights   03/15/2021 1800 03/18/21 0500  Weight: 48.5 kg 55.1 kg    Physical Exam   General appearance: alert, appears stated age, and no distress Neck: no carotid bruit, no JVD, and thyroid not enlarged, symmetric, no tenderness/mass/nodules Lungs: diminished breath sounds bilaterally Heart: regular rate and rhythm, S1, S2 normal, no murmur, click, rub or gallop Abdomen: soft, non-tender; bowel sounds normal; no masses,  no organomegaly Extremities: extremities normal, atraumatic, no cyanosis or edema Pulses: 2+ and symmetric Skin: pale, warm, dry Neurologic: Alert and oriented X 3, normal strength and tone. Normal symmetric reflexes. Normal coordination and gait Psych: Pleasant  Inpatient Medications    Scheduled Meds:  aspirin EC  81 mg Oral Daily   atorvastatin  80 mg Oral QHS   Chlorhexidine Gluconate Cloth  6 each Topical Daily   enoxaparin (LOVENOX) injection  30 mg Subcutaneous Q24H   feeding supplement  237 mL Oral TID BM   fludrocortisone  0.1 mg Oral Q24H   gabapentin  300 mg Oral  QHS   guaiFENesin  1,200 mg Oral BID   hydrocortisone  10 mg Oral Q24H   hydrocortisone  5 mg Oral Q24H   ipratropium  0.5 mg Nebulization BID   latanoprost  1 drop Both Eyes QHS   levalbuterol  0.63 mg Nebulization BID   metoprolol succinate  12.5 mg Oral Daily   multivitamin with minerals  1 tablet Oral Daily   pantoprazole  40 mg Oral Daily   potassium chloride  40 mEq Oral Once   sodium chloride flush  3 mL Intravenous Q12H    Continuous Infusions:  sodium chloride Stopped (03/17/21 2119)   piperacillin-tazobactam (ZOSYN)  IV 3.375 g (03/18/21 0503)    PRN Meds: sodium chloride, acetaminophen **OR** acetaminophen, gabapentin, ondansetron **OR** ondansetron (ZOFRAN) IV, polyethylene glycol, sorbitol   Labs   Results for orders placed or performed during the hospital encounter of 02/19/2021 (from the past 48 hour(s))  Lactic acid, plasma     Status: Abnormal   Collection Time: 03/15/2021  2:16 PM  Result Value Ref Range   Lactic Acid, Venous 2.4 (HH) 0.5 - 1.9 mmol/L    Comment: CRITICAL RESULT CALLED TO, READ BACK BY AND VERIFIED WITH: LEONARD,S. RN @1530  ON 03/08/2021 BY COHEN,K Performed at [facility], [place] [address] [place], [place] [address]   Comprehensive metabolic panel  Status: Abnormal   Collection Time: 03/11/2021  2:16 PM  Result Value Ref Range   Sodium 145 135 - 145 mmol/L   Potassium 3.2 (L) 3.5 - 5.1 mmol/L   Chloride 110 98 - 111 mmol/L   CO2 26 22 - 32 mmol/L   Glucose, Bld 115 (H) 70 - 99 mg/dL    Comment: Glucose reference range applies only to samples taken after fasting for at least 8 hours.   BUN 15 8 - 23 mg/dL   Creatinine, Ser 0.74 0.44 - 1.00 mg/dL   Calcium 8.7 (L) 8.9 - 10.3 mg/dL   Total Protein 6.2 (L) 6.5 - 8.1 g/dL   Albumin 2.9 (L) 3.5 - 5.0 g/dL   AST 36 15 - 41 U/L   ALT 21 0 - 44 U/L   Alkaline Phosphatase 48 38 - 126 U/L   Total Bilirubin 1.2 0.3 - 1.2 mg/dL   GFR, Estimated >60 >60 mL/min    Comment:  (NOTE) Calculated using the CKD-EPI Creatinine Equation (2021)    Anion gap 9 5 - 15    Comment: Performed at [facility], [place] [address] [place], [place] [address]  CBC WITH DIFFERENTIAL     Status: Abnormal   Collection Time: 03/01/2021  2:16 PM  Result Value Ref Range   WBC 8.4 4.0 - 10.5 K/uL   RBC 2.92 (L) 3.87 - 5.11 MIL/uL   Hemoglobin 10.6 (L) 12.0 - 15.0 g/dL   HCT 32.7 (L) 36.0 - 46.0 %   MCV 112.0 (H) 80.0 - 100.0 fL   MCH 36.3 (H) 26.0 - 34.0 pg   MCHC 32.4 30.0 - 36.0 g/dL   RDW 13.2 11.5 - 15.5 %   Platelets 147 (L) 150 - 400 K/uL   nRBC 0.9 (H) 0.0 - 0.2 %   Neutrophils Relative % 58 %   Neutro Abs 4.9 1.7 - 7.7 K/uL   Lymphocytes Relative 24 %   Lymphs Abs 2.1 0.7 - 4.0 K/uL   Monocytes Relative 15 %   Monocytes Absolute 1.3 (H) 0.1 - 1.0 K/uL   Eosinophils Relative 1 %   Eosinophils Absolute 0.1 0.0 - 0.5 K/uL   Basophils Relative 1 %   Basophils Absolute 0.1 0.0 - 0.1 K/uL   Immature Granulocytes 1 %   Abs Immature Granulocytes 0.04 0.00 - 0.07 K/uL    Comment: Performed at [facility], [place] [address] [place], [place] [address]  Protime-INR     Status: Abnormal   Collection Time: 03/07/2021  2:16 PM  Result Value Ref Range   Prothrombin Time 16.2 (H) 11.4 - 15.2 seconds   INR 1.3 (H) 0.8 - 1.2    Comment: (NOTE) INR goal varies based on device and disease states. Performed at [facility], [place] [address], [place], [place] [address]   APTT     Status: None   Collection Time: 02/19/2021  2:16 PM  Result Value Ref Range   aPTT 35 24 - 36 seconds    Comment: Performed at [facility], [place] [address], [place], [place] [address]  Blood culture (routine single)     Status: None (Preliminary result)   Collection Time: 02/21/2021  2:17 PM   Specimen: BLOOD  Result Value Ref Range   Specimen Description      BLOOD SITE NOT SPECIFIED Performed at [facility], [place] [address],  [place], [place] [address]    Special Requests  BOTTLES DRAWN AEROBIC AND ANAEROBIC Blood Culture results may not be optimal due to an excessive volume of blood received in culture bottles Performed at [facility], [place] [address], [place], [place] [address]    Culture      NO GROWTH 2 DAYS Performed at [facility], [place] [address], [place], [place] [address]    Report Status PENDING   Lactic acid, plasma     Status: None   Collection Time: 03/05/2021  3:49 PM  Result Value Ref Range   Lactic Acid, Venous 1.3 0.5 - 1.9 mmol/L    Comment: Performed at [facility], [place] [address] [place], [place] [address]  Resp Panel by RT-PCR (Flu A&B, Covid) Nasopharyngeal Swab     Status: None   Collection Time: 02/17/2021  5:36 PM   Specimen: Nasopharyngeal Swab; Nasopharyngeal(NP) swabs in vial transport medium  Result Value Ref Range   SARS Coronavirus 2 by RT PCR NEGATIVE NEGATIVE    Comment: (NOTE) SARS-CoV-2 target nucleic acids are NOT DETECTED.  The SARS-CoV-2 RNA is generally detectable in upper respiratory specimens during the acute phase of infection. The lowest concentration of SARS-CoV-2 viral copies this assay can detect is 138 copies/mL. A negative result does not preclude SARS-Cov-2 infection and should not be used as the sole basis for treatment or other patient management decisions. A negative result may occur with  improper specimen collection/handling, submission of specimen other than nasopharyngeal swab, presence of viral mutation(s) within the areas targeted by this assay, and inadequate number of viral copies(<138 copies/mL). A negative result must be combined with clinical observations, patient history, and epidemiological information. The expected result is Negative.  Fact Sheet for Patients:  EntrepreneurPulse.com.au  Fact Sheet for Healthcare Providers:  IncredibleEmployment.be  This  test is no t yet approved or cleared by the [place] FDA and  has been authorized for detection and/or diagnosis of SARS-CoV-2 by FDA under an Emergency Use Authorization (EUA). This EUA will remain  in effect (meaning this test can be used) for the duration of the COVID-19 declaration under Section 564(b)(1) of the Act, 21 U.S.C.section 360bbb-3(b)(1), unless the authorization is terminated  or revoked sooner.       Influenza A by PCR NEGATIVE NEGATIVE   Influenza B by PCR NEGATIVE NEGATIVE    Comment: (NOTE) The Xpert Xpress SARS-CoV-2/FLU/RSV plus assay is intended as an aid in the diagnosis of influenza from Nasopharyngeal swab specimens and should not be used as a sole basis for treatment. Nasal washings and aspirates are unacceptable for Xpert Xpress SARS-CoV-2/FLU/RSV testing.  Fact Sheet for Patients: EntrepreneurPulse.com.au  Fact Sheet for Healthcare Providers: IncredibleEmployment.be  This test is not yet approved or cleared by the [place] FDA and has been authorized for detection and/or diagnosis of SARS-CoV-2 by FDA under an Emergency Use Authorization (EUA). This EUA will remain in effect (meaning this test can be used) for the duration of the COVID-19 declaration under Section 564(b)(1) of the Act, 21 U.S.C. section 360bbb-3(b)(1), unless the authorization is terminated or revoked.  Performed at [facility], [place] [address] [place],  [address]   Culture, blood (routine x 2) Call MD if unable to obtain prior to antibiotics being given     Status: None (Preliminary result)   Collection Time: 02/24/2021  7:14 PM   Specimen: BLOOD  Result Value Ref Range   Specimen Description      BLOOD RIGHT ANTECUBITAL Performed at [facility], [place] [address] [place], [place] [address]    Special Requests      BOTTLES DRAWN AEROBIC AND ANAEROBIC Blood Culture adequate  volume Performed at [place] [address], [place], [place] [address]    Culture      NO GROWTH 2 DAYS Performed at [facility], [place] [address] [place], [place] [address]    Report Status PENDING   Magnesium     Status: None   Collection Time: 02/16/2021  7:15 PM  Result Value Ref Range   Magnesium 1.7 1.7 - 2.4 mg/dL    Comment: Performed at [facility], [place] [address], [place], [place] [address]  HIV Antibody (routine testing w rflx)     Status: None   Collection Time: 03/03/2021  7:15 PM  Result Value Ref Range   HIV Screen 4th Generation wRfx Non Reactive Non Reactive    Comment: Performed at [facility], [place] [address] [place], [place] [address]  Troponin I (High Sensitivity)     Status: Abnormal   Collection Time: 02/17/2021  7:15 PM  Result Value Ref Range   Troponin I (High Sensitivity) 3,493 (HH) <18 ng/L    Comment: CRITICAL RESULT CALLED TO, READ BACK BY AND VERIFIED WITH: DAVIS R @2036  BY BATTLET (NOTE) Elevated high sensitivity troponin I (hsTnI) values and significant  changes across serial measurements may suggest ACS but many other  chronic and acute conditions are known to elevate hsTnI results.  Refer to the Links section for chest pain algorithms and additional  guidance. Performed at [facility], [place] [address], [place], [place] [address]   Procalcitonin - Baseline     Status: None   Collection Time: 03/08/2021  7:15 PM  Result Value Ref Range   Procalcitonin <0.10 ng/mL    Comment:        Interpretation: PCT (Procalcitonin) <= 0.5 ng/mL: Systemic infection (sepsis) is not likely. Local bacterial infection is possible. (NOTE)       Sepsis PCT Algorithm           Lower Respiratory Tract                                      Infection PCT Algorithm    ----------------------------     ----------------------------         PCT < 0.25 ng/mL                PCT < 0.10 ng/mL           Strongly encourage             Strongly discourage   discontinuation of antibiotics    initiation of antibiotics    ----------------------------     -----------------------------       PCT 0.25 - 0.50 ng/mL            PCT 0.10 - 0.25 ng/mL               OR       >80% decrease in PCT            Discourage initiation of  antibiotics      Encourage discontinuation           of antibiotics    ----------------------------     -----------------------------         PCT >= 0.50 ng/mL              PCT 0.26 - 0.50 ng/mL               AND        <80% decrease in PCT             Encourage initiation of                                             antibiotics       Encourage continuation           of antibiotics    ----------------------------     -----------------------------        PCT >= 0.50 ng/mL                  PCT > 0.50 ng/mL               AND         increase in PCT                  Strongly encourage                                      initiation of antibiotics    Strongly encourage escalation           of antibiotics                                     -----------------------------                                           PCT <= 0.25 ng/mL                                                 OR                                        > 80% decrease in PCT                                      Discontinue / Do not initiate                                             antibiotics  Performed at [place] [address], [place], [place] [address]   Vitamin B12     Status: Abnormal   Collection Time:  02/27/2021  7:15 PM  Result Value Ref Range   Vitamin B-12 2,239 (H) 180 - 914 pg/mL    Comment: RESULTS CONFIRMED BY MANUAL DILUTION (NOTE) This assay is not validated for testing neonatal or myeloproliferative syndrome specimens for Vitamin B12 levels. Performed at [facility], [place] [address], [place], [place] [address]   Folate     Status: None   Collection Time: 02/28/2021  7:15 PM  Result Value Ref Range   Folate 26.9 >5.9 ng/mL    Comment: RESULTS CONFIRMED BY MANUAL DILUTION Performed at [place] [address], [place], [place] [address]   Iron and TIBC     Status: Abnormal   Collection Time: 03/05/2021  7:15 PM  Result Value Ref Range   Iron 31 28 - 170 ug/dL   TIBC 169 (L) 250 - 450 ug/dL   Saturation Ratios 18 10.4 - 31.8 %   UIBC 138 ug/dL    Comment: Performed at [facility], [place] [address] [place], [place] [address]  Ferritin     Status: Abnormal   Collection Time: 03/10/2021  7:15 PM  Result Value Ref Range   Ferritin 380 (H) 11 - 307 ng/mL    Comment: Performed at [facility], [place] [address] [place], [place] [address]  Reticulocytes     Status: Abnormal   Collection Time: 03/05/2021  7:15 PM  Result Value Ref Range   Retic Ct Pct 1.5 0.4 - 3.1 %   RBC. 2.74 (L) 3.87 - 5.11 MIL/uL   Retic Count, Absolute 42.2 19.0 - 186.0 K/uL   Immature Retic Fract 20.3 (H) 2.3 - 15.9 %    Comment: Performed at [facility] [place] [address], [place], [place] [address]  TSH     Status: None   Collection Time: 03/15/2021  7:15 PM  Result Value Ref Range   TSH 2.628 0.350 - 4.500 uIU/mL    Comment: Performed by a 3rd Generation assay with a functional sensitivity of <=0.01 uIU/mL. Performed at [facility], [place] [address] [place], [place] [address]   Culture, blood (routine x 2) Call MD if unable to obtain prior to antibiotics being given     Status: None (Preliminary result)   Collection Time: 03/12/2021  7:16 PM   Specimen: BLOOD LEFT HAND  Result Value Ref Range   Specimen Description      BLOOD LEFT HAND Performed at [place] [address] [place], [place] [address]    Special Requests      BOTTLES DRAWN AEROBIC AND ANAEROBIC Blood Culture  adequate volume Performed at [place] [address], [place], [place] [address]    Culture      NO GROWTH 2 DAYS Performed at [facility], [place] [address], [place],  [address]    Report Status PENDING   MRSA Next Gen by PCR, Nasal     Status: None   Collection Time: 03/04/2021  8:53 PM   Specimen: Nasal Mucosa; Nasal Swab  Result Value Ref Range   MRSA by PCR Next Gen NOT DETECTED NOT DETECTED    Comment: (NOTE) The GeneXpert MRSA Assay (FDA approved for NASAL specimens only), is one component of a comprehensive MRSA colonization surveillance program. It is not intended to diagnose MRSA infection nor to guide or monitor treatment for MRSA infections. Test performance is not FDA approved in patients less than 35 years old.  Performed at [facility], [place] [address] [place], [place] [address]   Troponin I (High Sensitivity)     Status: Abnormal   Collection Time: 02/19/2021  9:39 PM  Result Value Ref Range   Troponin I (High Sensitivity) 3,035 (HH) <18 ng/L    Comment: CRITICAL VALUE NOTED.  VALUE IS CONSISTENT WITH PREVIOUSLY REPORTED AND CALLED VALUE. (NOTE) Elevated high sensitivity troponin I (hsTnI) values and significant  changes across serial measurements may suggest ACS but many other  chronic and acute conditions are known to elevate hsTnI results.  Refer to the Links section for chest pain algorithms and additional  guidance. Performed at [facility], [place] [address] [place], [place] [address]   Urinalysis, Routine w reflex microscopic Urine, Catheterized     Status: Abnormal   Collection Time: 03/17/21  3:01 AM  Result Value Ref Range   Color, Urine YELLOW YELLOW   APPearance CLEAR CLEAR   Specific Gravity, Urine 1.015 1.005 - 1.030   pH 5.0 5.0 - 8.0   Glucose, UA NEGATIVE NEGATIVE mg/dL   Hgb urine dipstick SMALL (A) NEGATIVE   Bilirubin Urine NEGATIVE NEGATIVE   Ketones, ur NEGATIVE  NEGATIVE mg/dL   Protein, ur NEGATIVE NEGATIVE mg/dL   Nitrite POSITIVE (A) NEGATIVE   Leukocytes,Ua TRACE (A) NEGATIVE   RBC / HPF 0-5 0 - 5 RBC/hpf   WBC, UA >50 (H) 0 - 5 WBC/hpf   Bacteria, UA NONE SEEN NONE SEEN   Mucus PRESENT    Hyaline Casts, UA PRESENT     Comment: Performed at [facility], [place] [address] [place], [place] [address]  Strep pneumoniae urinary antigen     Status: None   Collection Time: 03/17/21  3:01 AM  Result Value Ref Range   Strep Pneumo Urinary Antigen NEGATIVE NEGATIVE    Comment:        Infection due to S. pneumoniae cannot be absolutely ruled out since the antigen present may be below the detection limit of the test. Performed at [facility], [address] [place], [place] [address]   Lactic acid, plasma     Status: None   Collection Time: 03/17/21  3:12 AM  Result Value Ref Range   Lactic Acid, Venous 1.0 0.5 - 1.9 mmol/L    Comment: Performed at [facility], [place] [address], [place], [place] [address]  Procalcitonin     Status: None   Collection Time: 03/17/21  3:12 AM  Result Value Ref Range   Procalcitonin <0.10 ng/mL    Comment:        Interpretation: PCT (Procalcitonin) <= 0.5 ng/mL: Systemic infection (sepsis) is not likely. Local bacterial infection is possible. (NOTE)       Sepsis PCT Algorithm           Lower Respiratory Tract                                      Infection PCT Algorithm    ----------------------------     ----------------------------         PCT < 0.25 ng/mL                PCT < 0.10 ng/mL          Strongly encourage             Strongly discourage   discontinuation of  antibiotics    initiation of antibiotics    ----------------------------     -----------------------------       PCT 0.25 - 0.50 ng/mL            PCT 0.10 - 0.25 ng/mL               OR       >80% decrease in PCT            Discourage initiation of                                             antibiotics      Encourage discontinuation           of antibiotics    ----------------------------     -----------------------------         PCT >= 0.50 ng/mL              PCT 0.26 - 0.50 ng/mL               AND        <80% decrease in PCT             Encourage initiation of                                             antibiotics       Encourage continuation           of antibiotics    ----------------------------     -----------------------------        PCT >= 0.50 ng/mL                  PCT > 0.50 ng/mL               AND         increase in PCT                  Strongly encourage                                      initiation of antibiotics    Strongly encourage escalation           of antibiotics                                     -----------------------------                                           PCT <= 0.25 ng/mL                                                 OR                                        >  80% decrease in PCT                                      Discontinue / Do not initiate                                             antibiotics  Performed at [place] [address], [place], [place] [address]   Magnesium     Status: None   Collection Time: 03/17/21  3:12 AM  Result Value Ref Range   Magnesium 2.3 1.7 - 2.4 mg/dL    Comment: Performed at [facility], [place] [address] [place], [place] [address]  Basic metabolic panel     Status: Abnormal   Collection Time: 03/17/21  3:12 AM  Result Value Ref Range   Sodium 141 135 - 145 mmol/L   Potassium 4.1 3.5 - 5.1 mmol/L    Comment: NO VISIBLE HEMOLYSIS   Chloride 112 (H) 98 - 111 mmol/L   CO2 25 22 - 32 mmol/L   Glucose, Bld 87 70 - 99 mg/dL    Comment: Glucose reference range applies only to samples taken after fasting for at least 8 hours.   BUN 14 8 - 23 mg/dL   Creatinine, Ser 0.58 0.44 - 1.00 mg/dL   Calcium 7.7 (L) 8.9 - 10.3 mg/dL   GFR, Estimated >60 >60  mL/min    Comment: (NOTE) Calculated using the CKD-EPI Creatinine Equation (2021)    Anion gap 4 (L) 5 - 15    Comment: Performed at [facility], [place] [address] [place], [place] [address]  CBC WITH DIFFERENTIAL     Status: Abnormal   Collection Time: 03/17/21  3:12 AM  Result Value Ref Range   WBC 6.4 4.0 - 10.5 K/uL   RBC 2.71 (L) 3.87 - 5.11 MIL/uL   Hemoglobin 9.8 (L) 12.0 - 15.0 g/dL   HCT 30.0 (L) 36.0 - 46.0 %   MCV 110.7 (H) 80.0 - 100.0 fL   MCH 36.2 (H) 26.0 - 34.0 pg   MCHC 32.7 30.0 - 36.0 g/dL   RDW 13.2 11.5 - 15.5 %   Platelets 114 (L) 150 - 400 K/uL    Comment: Immature Platelet Fraction may be clinically indicated, consider ordering this additional test [id number] PLATELET COUNT CONFIRMED BY SMEAR    nRBC 0.0 0.0 - 0.2 %   Neutrophils Relative % 70 %   Neutro Abs 4.4 1.7 - 7.7 K/uL   Lymphocytes Relative 18 %   Lymphs Abs 1.2 0.7 - 4.0 K/uL   Monocytes Relative 10 %   Monocytes Absolute 0.6 0.1 - 1.0 K/uL   Eosinophils Relative 1 %   Eosinophils Absolute 0.1 0.0 - 0.5 K/uL   Basophils Relative 1 %   Basophils Absolute 0.1 0.0 - 0.1 K/uL   Immature Granulocytes 0 %   Abs Immature Granulocytes 0.02 0.00 - 0.07 K/uL   Polychromasia PRESENT     Comment: Performed at [facility], [place] [address] [place], [place] [address]  Lipid panel     Status: Abnormal   Collection Time: 03/18/21  3:18 AM  Result Value Ref Range   Cholesterol 83 0 - 200 mg/dL   Triglycerides 56 <150 mg/dL  HDL 33 (L) >40 mg/dL   Total CHOL/HDL Ratio 2.5 RATIO   VLDL 11 0 - 40 mg/dL   LDL Cholesterol 39 0 - 99 mg/dL    Comment:        Total Cholesterol/HDL:CHD Risk Coronary Heart Disease Risk Table                     Men   Women  1/2 Average Risk   3.4   3.3  Average Risk       5.0   4.4  2 X Average Risk   9.6   7.1  3 X Average Risk  23.4   11.0        Use the calculated Patient Ratio above and the CHD Risk Table to determine the  patient's CHD Risk.        ATP III CLASSIFICATION (LDL):  <100     mg/dL   Optimal  100-129  mg/dL   Near or Above                    Optimal  130-159  mg/dL   Borderline  160-189  mg/dL   High  >190     mg/dL   Very High Performed at [place] [address], [place], [place] [address]   CBC with Differential/Platelet     Status: Abnormal   Collection Time: 03/18/21  3:18 AM  Result Value Ref Range   WBC 6.1 4.0 - 10.5 K/uL   RBC 2.70 (L) 3.87 - 5.11 MIL/uL   Hemoglobin 10.0 (L) 12.0 - 15.0 g/dL   HCT 29.5 (L) 36.0 - 46.0 %   MCV 109.3 (H) 80.0 - 100.0 fL   MCH 37.0 (H) 26.0 - 34.0 pg   MCHC 33.9 30.0 - 36.0 g/dL   RDW 13.3 11.5 - 15.5 %   Platelets 119 (L) 150 - 400 K/uL    Comment: Immature Platelet Fraction may be clinically indicated, consider ordering this additional test [id number] CONSISTENT WITH PREVIOUS RESULT    nRBC 0.5 (H) 0.0 - 0.2 %   Neutrophils Relative % 65 %   Neutro Abs 4.0 1.7 - 7.7 K/uL   Lymphocytes Relative 22 %   Lymphs Abs 1.3 0.7 - 4.0 K/uL   Monocytes Relative 9 %   Monocytes Absolute 0.5 0.1 - 1.0 K/uL   Eosinophils Relative 3 %   Eosinophils Absolute 0.2 0.0 - 0.5 K/uL   Basophils Relative 1 %   Basophils Absolute 0.0 0.0 - 0.1 K/uL   Immature Granulocytes 0 %   Abs Immature Granulocytes 0.02 0.00 - 0.07 K/uL    Comment: Performed at [facility], [place] [address], [place], [place] [address]  Basic metabolic panel     Status: Abnormal   Collection Time: 03/18/21  3:18 AM  Result Value Ref Range   Sodium 145 135 - 145 mmol/L   Potassium 3.1 (L) 3.5 - 5.1 mmol/L    Comment: DELTA CHECK NOTED   Chloride 114 (H) 98 - 111 mmol/L   CO2 22 22 - 32 mmol/L   Glucose, Bld 82 70 - 99 mg/dL    Comment: Glucose reference range applies only to samples taken after fasting for at least 8 hours.   BUN 12 8 - 23 mg/dL   Creatinine, Ser 0.54 0.44 - 1.00 mg/dL   Calcium 8.2 (L) 8.9 - 10.3 mg/dL   GFR, Estimated  >60 >60 mL/min  Comment: (NOTE) Calculated using the CKD-EPI Creatinine Equation (2021)    Anion gap 9 5 - 15    Comment: Performed at [facility], [place] [address] [place], [place] [address]  Magnesium     Status: None   Collection Time: 03/18/21  3:18 AM  Result Value Ref Range   Magnesium 2.0 1.7 - 2.4 mg/dL    Comment: Performed at [facility], [place] [address], [place], [place] [address]    ECG   N/A  Telemetry   Sinus rhythm - Personally Reviewed  Radiology    DG Chest 1 View  Result Date: 02/25/2021 CLINICAL DATA:  Weakness and right flank pain. EXAM: CHEST  1 VIEW COMPARISON:  Chest x-ray 02/06/2015.  CT chest 05/09/2020. FINDINGS: Postsurgical changes in the left upper lobe appear stable. There is increasing right upper lobe/perihilar airspace disease. There is a band of opacity in the right upper lobe similar to the prior study. A large hiatal hernia is again seen. There are atherosclerotic calcifications of the aorta. Heart size is within normal limits. The costophrenic angles are clear. There is no pneumothorax. No acute fractures are seen. IMPRESSION: 1. New right upper lobe/perihilar airspace disease. Findings may be related to infection. Other etiologies such as neoplasm cannot be excluded given patient's history. 2. Stable postsurgical changes in the left lung. Electronically Signed   By: Ronney Asters M.D.   On: 02/21/2021 15:06   CT L-SPINE NO CHARGE  Result Date: 02/25/2021 CLINICAL DATA:  Back pain. Worsening weakness for 1 week with right flank pain. EXAM: CT LUMBAR SPINE WITHOUT CONTRAST TECHNIQUE: Multidetector CT imaging of the lumbar spine was performed without intravenous contrast administration. Multiplanar CT image reconstructions were also generated. COMPARISON:  Lumbar spine MRI 09/05/2012 FINDINGS: Segmentation: The lowest fully formed intervertebral disc space is L5-S1. There are no ribs at T12. Alignment: Chronic  straightening of the normal lumbar lordosis. No listhesis. Vertebrae: Unchanged chronic T12 and L1 compression fractures. No acute fracture or suspicious osseous lesion. Scattered small Schmorl's nodes. Diffuse osteopenia. Paraspinal and other soft tissues: No acute abnormality identified in the paraspinal soft tissues. Intra-and pelvic contents reported separately. Disc levels: Similar appearance of diffuse lumbar disc and facet degeneration compared to the prior MRI, most notable at L4-5 where there is moderate spinal stenosis, moderate bilateral lateral recess stenosis, and moderate right neural foraminal stenosis. Unchanged moderate left greater than right neural foraminal stenosis at L5-S1. IMPRESSION: 1. No acute osseous abnormality identified in the lumbar spine. 2. Unchanged chronic T12 and L1 compression fractures. 3. Unchanged lumbar disc and facet degeneration. Electronically Signed   By: Logan Bores M.D.   On: 02/19/2021 14:43   ECHOCARDIOGRAM COMPLETE  Result Date: 03/17/2021    ECHOCARDIOGRAM REPORT   Patient Name:   Leslie Petersen Date of Exam: 03/17/2021 Medical Rec #:  [id number]    Height:       64.0 in Accession #:    [id number]   Weight:       106.9 lb Date of Birth:  July 24, 1934   BSA:          1.499 m Patient Age:    75 years     BP:           133/30 mmHg Patient Gender: F            HR:           78 bpm. Exam Location:  Inpatient Procedure: 2D Echo, Color Doppler and Cardiac  Doppler Indications:    I48.91* Unspecified atrial fibrillation  History:        Patient has no prior history of Echocardiogram examinations.                 Lung Cancer.  Sonographer:    Raquel Sarna Senior RDCS Referring Phys: [place]  Sonographer Comments: Technically difficult due to small rib spacing. IMPRESSIONS  1. Left ventricular ejection fraction, by estimation, is 65 to 70%. The left ventricle has normal function. The left ventricle has no regional wall motion abnormalities. There is moderate left  ventricular hypertrophy of the basal-septal segment. Left ventricular diastolic parameters are consistent with Grade I diastolic dysfunction (impaired relaxation). Elevated left ventricular end-diastolic pressure.  2. Right ventricular systolic function is normal. The right ventricular size is normal. There is normal pulmonary artery systolic pressure.  3. The mitral valve is degenerative. Mild mitral valve regurgitation. No evidence of mitral stenosis.  4. The aortic valve is normal in structure. Aortic valve regurgitation is mild. No aortic stenosis is present.  5. The inferior vena cava is normal in size with greater than 50% respiratory variability, suggesting right atrial pressure of 3 mmHg. FINDINGS  Left Ventricle: Left ventricular ejection fraction, by estimation, is 65 to 70%. The left ventricle has normal function. The left ventricle has no regional wall motion abnormalities. The left ventricular internal cavity size was normal in size. There is  moderate left ventricular hypertrophy of the basal-septal segment. Left ventricular diastolic parameters are consistent with Grade I diastolic dysfunction (impaired relaxation). Elevated left ventricular end-diastolic pressure. Right Ventricle: The right ventricular size is normal. No increase in right ventricular wall thickness. Right ventricular systolic function is normal. There is normal pulmonary artery systolic pressure. The tricuspid regurgitant velocity is 2.71 m/s, and  with an assumed right atrial pressure of 3 mmHg, the estimated right ventricular systolic pressure is 34.1 mmHg. Left Atrium: Left atrial size was normal in size. Right Atrium: Right atrial size was normal in size. Pericardium: There is no evidence of pericardial effusion. Mitral Valve: The mitral valve is degenerative in appearance. There is mild thickening of the mitral valve leaflet(s). There is mild calcification of the mitral valve leaflet(s). Mild mitral annular calcification. Mild  mitral valve regurgitation. No evidence of mitral valve stenosis. MV peak gradient, 7.3 mmHg. The mean mitral valve gradient is 4.0 mmHg. Tricuspid Valve: The tricuspid valve is normal in structure. Tricuspid valve regurgitation is mild . No evidence of tricuspid stenosis. Aortic Valve: The aortic valve is normal in structure. Aortic valve regurgitation is mild. No aortic stenosis is present. Pulmonic Valve: The pulmonic valve was normal in structure. Pulmonic valve regurgitation is not visualized. No evidence of pulmonic stenosis. Aorta: The aortic root is normal in size and structure. Venous: The inferior vena cava is normal in size with greater than 50% respiratory variability, suggesting right atrial pressure of 3 mmHg. IAS/Shunts: No atrial level shunt detected by color flow Doppler.  LEFT VENTRICLE PLAX 2D LVIDd:         2.30 cm  Diastology LVIDs:         1.50 cm  LV e' medial:    4.68 cm/s LV PW:         1.10 cm  LV E/e' medial:  26.5 LV IVS:        1.40 cm  LV e' lateral:   5.98 cm/s LVOT diam:     1.80 cm  LV E/e' lateral: 20.7 LV SV:  79 LV SV Index:   52 LVOT Area:     2.54 cm  RIGHT VENTRICLE RV S prime:     17.70 cm/s TAPSE (M-mode): 2.3 cm LEFT ATRIUM             Index       RIGHT ATRIUM           Index LA diam:        2.50 cm 1.67 cm/m  RA Area:     11.80 cm LA Vol (A2C):   62.5 ml 41.69 ml/m RA Volume:   23.00 ml  15.34 ml/m LA Vol (A4C):   34.0 ml 22.68 ml/m LA Biplane Vol: 49.6 ml 33.08 ml/m  AORTIC VALVE LVOT Vmax:   138.00 cm/s LVOT Vmean:  96.800 cm/s LVOT VTI:    0.309 m  AORTA Ao Root diam: 3.20 cm Ao Asc diam:  3.40 cm MITRAL VALVE                TRICUSPID VALVE MV Area (PHT): 2.44 cm     TR Peak grad:   29.4 mmHg MV Area VTI:   2.28 cm     TR Vmax:        271.00 cm/s MV Peak grad:  7.3 mmHg MV Mean grad:  4.0 mmHg     SHUNTS MV Vmax:       1.35 m/s     Systemic VTI:  0.31 m MV Vmean:      95.4 cm/s    Systemic Diam: 1.80 cm MV Decel Time: 311 msec MV E velocity: 124.00 cm/s  MV A velocity: 139.00 cm/s MV E/A ratio:  0.89 Fransico Him MD Electronically signed by Fransico Him MD Signature Date/Time: 03/17/2021/1:06:00 PM    Final    CT Renal Stone Study  Result Date: 02/28/2021 CLINICAL DATA:  Flank pain in an 86 year old female for 1 week, difficulty ambulating. EXAM: CT ABDOMEN AND PELVIS WITHOUT CONTRAST TECHNIQUE: Multidetector CT imaging of the abdomen and pelvis was performed following the standard protocol without IV contrast. COMPARISON:  Chest x-ray and lumbar spine evaluation of the same date, CT of the chest, abdomen and pelvis from 2016. FINDINGS: Lower chest: Large hiatal hernia extends into the chest, greater than 50% of the stomach has herniated into the chest as on previous studies. Three-vessel coronary artery disease. No substantial pericardial effusion. Pulmonary emphysema.  No pleural effusion or consolidation. Hepatobiliary: Liver with smooth contours. No pericholecystic stranding. No gross biliary duct distension. Pancreas: Pancreatic atrophy, no signs of inflammation. Spleen: Normal. Adrenals/Urinary Tract: Post bilateral adrenalectomy. Nephrolithiasis on the LEFT, small intrarenal calculi largest in the lower pole measuring approximately 4 mm. No definite ureteral calculi, no signs of hydronephrosis or perinephric stranding. There are numerous phleboliths in the pelvis that displays similar distribution and appearance compared to prior imaging. Urinary bladder is collapsed. Stomach/Bowel: Much of the stomach again is herniated into the chest. No acute small bowel process. Fluid-filled small bowel loops of predominantly distal small bowel without substantial distension and without adjacent stranding. Moderate stool in the rectum without perirectal stranding. Colonic diverticulosis throughout the colon. No signs of colonic inflammation. Appendix not visualized, no secondary signs to suggest appendicitis. Small subcostal abdominal wall laxity contains portion of  the descending colon without sign of obstruction or inflammation. Vascular/Lymphatic: Atheromatous plaque of the abdominal aorta without aneurysmal dilation. There is no gastrohepatic or hepatoduodenal ligament lymphadenopathy. No retroperitoneal or mesenteric lymphadenopathy. No pelvic sidewall lymphadenopathy. Reproductive: Cystic lesion LEFT adnexa 3.5 x  2.5 x 3.5 cm Other: No free intra-abdominal air.  No ascites. Musculoskeletal: Osteopenia. Spinal degenerative changes. No acute bone finding or destructive bone process. T12 and L1 with loss of height, chronic and better evaluated on dedicated spinal recons, reported separately. IMPRESSION: No acute intra-abdominal pathology.  Post bilateral adrenalectomy. Cystic lesion LEFT adnexa 3.5 x 2.5 x 3.5 cm. 3.5 cm left adnexal simple-appearing cyst, not adequately characterized. Recommend prompt follow-up with pelvic US, this could be performed on a nonemergent basis. Reference: JACR 2020 Feb;17(2):248-254 Large hiatal hernia as on previous imaging. Nephrolithiasis without signs of hydronephrosis or perinephric stranding. Three-vessel coronary artery disease. Aortic Atherosclerosis (ICD10-I70.0) and Emphysema (ICD10-J43.9). Electronically Signed   By: Zetta Bills M.D.   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  NSTEMI - significant troponin elevation which appears to be downtrending - given 3 vessel atherosclerosis, suspect she had  out of hospital MI, unlikely related to sepsis - no S/S of CHF - but silent as far as I can tell. Would continue aspirin 81 mg daily and Toprol XL 12.5 mg daily. Will stop atorvastatin as LDL 39 - overall cholesterol is quite low. As she remains chest pain free - would advocate medical management. 3.  CAP - upper lobe, ?Aspiration - Abx per hospital medicine, swallow eval yesterday  Time Spent Directly with Patient:  I have spent a total of 25 minutes with the patient reviewing hospital notes, telemetry, EKGs, labs and examining the patient as well as establishing an assessment and plan that was discussed personally with the patient.  > 50% of time was spent in direct patient care.  Length of Stay:  LOS: 2 days   Pixie Casino, MD, [facility], [place] Director of the Advanced Lipid Disorders &  Cardiovascular Risk Reduction Clinic Diplomate of the American Board of Clinical Lipidology Attending Cardiologist  Direct Dial: [phone]  Fax: [phone]  Website:  [email or web address] 03/18/2021, 8:33 AM

## 2021-03-18 NOTE — Consult Note (Signed)
Consultation Note Date: 03/18/2021   Patient Name: Leslie Petersen  DOB: May 30, 1935  MRN: [id number]  Age / Sex: 86 y.o., female  PCP: Kathyrn Lass, MD Referring Physician: Eugenie Filler, MD  Reason for Consultation: Establishing goals of care  HPI/Patient Profile: 86 y.o. female   admitted on 03/03/2021    Clinical Assessment and Goals of Care: 86 year old who lives at home with her daughter [place] in [place], [place].  Patient has history of chronic low back pain and adrenal cortical tumor status post bilateral resection.  She is maintained on Florinef and Cortef.  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 Benay Spice from oncology for cancer survivorship and is known to be in remission.  Patient quit smoking several years ago.  At home she uses a walker.  Patient has had a cough, her COVID test was negative but she was noted to have had ongoing weakness and was brought into the hospital.  She remains admitted to hospital medicine service for likely community-acquired pneumonia.  He was seen and evaluated by speech therapy with recommendations to continue regular diet with thin liquids.  Patient also found to have new onset paroxysmal versus transient atrial fibrillation and non-ST segment elevation MI in this hospitalization. Patient complains of mild nausea and chronic back pain.  Palliative consult for broad goals of care discussions has been requested. Patient is awake alert resting in bed.  Daughter present at bedside.  Introduced myself and palliative care as follows: Palliative medicine is specialized medical care for people living with serious illness. It focuses on providing relief from the symptoms and stress of a serious illness. The goal is to improve quality of life for both the patient and the  family. Goals of care: Broad aims of medical therapy in relation to the patient's values and preferences. Our aim is to provide medical care aimed at enabling patients to achieve the goals that matter most to them, given the circumstances of their particular medical situation and their constraints.  Discussed about scope of current hospitalization as well as patient's underlying medical conditions.  Goals wishes and values attempted to be explored.  Patient states that she wishes to remain home with her daughter for as long as possible.  We talked about palliative care, see below.  HCPOA  Daughter Leslie Petersen.   SUMMARY OF RECOMMENDATIONS    Agree with DNR Patient lives at home with daughter Leslie Petersen in [place].  Continue current mode of care Recommend home health care, home PT with addition of palliative services, this was discussed with patient and daughter.   Code Status/Advance Care Planning: DNR   Symptom Management:     Palliative Prophylaxis:  Frequent Pain Assessment  Additional Recommendations (Limitations, Scope, Preferences): Full Scope Treatment  Psycho-social/Spiritual:  Desire for further Chaplaincy support:yes Additional Recommendations: Caregiving  Support/Resources  Prognosis:  Unable to determine  Discharge Planning: Home with Palliative Services      Primary Diagnoses: Present on Admission:  New onset atrial fibrillation (HCC)  Osteoporosis  Hypotension  Adrenocortical carcinoma (HCC)  Chronic back pain  CAP (community acquired pneumonia)  Dehydration  Hypokalemia  Anemia  Adnexal cyst  Non-ST elevation (NSTEMI) myocardial infarction [facility])  Acute lower UTI   I have reviewed the medical record, interviewed the patient and family, and examined the patient. The following aspects are pertinent.  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   Esophageal polyp    GERD (gastroesophageal reflux disease)    Glaucoma    Hard of hearing    History of hiatal hernia    Lung cancer, upper lobe, left 2011   adenocarcinoma resected    Metastasis to adrenal gland ([place]) dx'd 1994, 2011   1994 left, 2011 right, back tumor 2015   Osteoporosis    S/P radiation therapy 9/26-16-03/20/15   SBRT LUL 54 Gy/33fx   Urinary frequency    Wears glasses    Social History   Socioeconomic History   Marital status: Divorced    Spouse name: Not on file   Number of children: Not on file   Years of education: Not on file   Highest education level: Not on file  Occupational History   Not on file  Tobacco Use   Smoking status: Former    Types: Cigarettes    Quit date: 02/17/1989    Years since quitting: 32.1   Smokeless tobacco: Never  Substance and Sexual Activity   Alcohol use: No   Drug use: No   Sexual activity: Not on file  Other Topics Concern   Not on file  Social History Narrative   Not on file   Social Determinants of Health   Financial Resource Strain: Not on file  Food Insecurity: Not on file  Transportation Needs: Not on file  Physical Activity: Not on file  Stress: Not on file  Social Connections: Not on file   Family History  Problem Relation Age of Onset   Heart attack Mother    Heart attack Father    Scheduled Meds:  aspirin EC  81 mg Oral Daily   Chlorhexidine Gluconate Cloth  6 each Topical Daily   enoxaparin (LOVENOX) injection  30 mg Subcutaneous Q24H   feeding supplement  237 mL Oral TID BM   fludrocortisone  0.1 mg Oral Q24H   gabapentin  300 mg Oral QHS   guaiFENesin  1,200 mg Oral BID   hydrocortisone  10 mg Oral Q24H   hydrocortisone  5 mg Oral Q24H   ipratropium  0.5 mg Nebulization BID   latanoprost  1 drop Both Eyes QHS   levalbuterol  0.63 mg Nebulization BID   metoprolol succinate  12.5 mg Oral Daily    multivitamin with minerals  1 tablet Oral Daily   pantoprazole  40 mg Oral Daily   sodium chloride flush  3 mL Intravenous Q12H   Continuous Infusions:  sodium chloride 10 mL/hr at 03/18/21 1317   piperacillin-tazobactam (ZOSYN)  IV Stopped (03/18/21 0903)   PRN Meds:.sodium chloride, acetaminophen **OR** acetaminophen, gabapentin, guaiFENesin, ondansetron **OR** ondansetron (ZOFRAN) IV, polyethylene glycol, QUEtiapine, sorbitol Medications Prior to Admission:  Prior to Admission medications   Medication Sig Start Date End Date Taking? Authorizing  Provider  atorvastatin (LIPITOR) 10 MG tablet Take 10 mg by mouth at bedtime. 04/27/17  Yes [provider]  Cholecalciferol (VITAMIN D3) 1000 UNITS CAPS Take 1,000 Units by mouth daily.    Yes [provider]  fludrocortisone (FLORINEF) 0.1 MG tablet Take 0.1 mg by mouth daily. At 8:00 am 09/12/11  Yes [provider]  gabapentin (NEURONTIN) 300 MG capsule Take one pill tid Patient taking differently: Take 300 mg by mouth as directed. Take 1 capsule (300 mg) at bedtime and Take 1 capsule (300 mg) Daily PRN Neuropathy 08/22/16  Yes Milton Ferguson, MD  hydrocortisone (CORTEF) 10 MG tablet Take 5-10 mg by mouth as directed. Take 10 mg tablet by mouth at 8:00 am 5 mg tablet at noon 09/12/11  Yes [provider]  latanoprost (XALATAN) 0.005 % ophthalmic solution Place 1 drop into both eyes at bedtime. 04/09/20  Yes [provider]  Multiple Vitamins-Minerals (MULTIVITAMIN WITH MINERALS) tablet Take 1 tablet by mouth daily after lunch.    Yes [provider]  olmesartan (BENICAR) 40 MG tablet Take 40 mg by mouth daily. 04/19/20  Yes [provider]  Pseudoephedrine HCl (SINUS 12 HOUR PO) Take 1 tablet by mouth daily.   Yes [provider]   Allergies  Allergen Reactions   Mirabegron Other (See Comments)    Patient had adverse effect to medication - doesn't recall reaction    Urabeth  [Bethanechol] Other (See Comments)    Pt doesn't remember the reaction   Review of Systems +back p ain  Physical Exam Mild back pain which is chronic in nature for this patient Regular work of breathing S 1 S 2  Abdomen not distended No edema No focal deficits.   Vital Signs: BP (!) 158/66   Pulse 90   Temp (!) 97.5 F (36.4 C) (Axillary)   Resp (!) 26   Ht 5\' 4"  (1.626 m)   Wt 55.1 kg   SpO2 90%   BMI 20.85 kg/m  Pain Scale: 0-10   Pain Score: 0-No pain   SpO2: SpO2: 90 % O2 Device:SpO2: 90 % O2 Flow Rate: .   IO: Intake/output summary:  Intake/Output Summary (Last 24 hours) at 03/18/2021 1322 Last data filed at 03/18/2021 1317 Gross per 24 hour  Intake 2021.84 ml  Output --  Net 2021.84 ml    LBM: Last BM Date: 03/18/21 Baseline Weight: Weight: 48.5 kg Most recent weight: Weight: 55.1 kg     Palliative Assessment/Data:   PPS 60%  Time In:  12 Time Out:  1300 Time Total:  60  Greater than 50%  of this time was spent counseling and coordinating care related to the above assessment and plan.  Signed by: Loistine Chance, MD   Please contact Palliative Medicine Team phone at [phone] for questions and concerns.  For individual provider: See Shea Evans

## 2021-03-18 NOTE — Progress Notes (Addendum)
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  Patient seen in the ED noted to have elevated lactic acid level, noted to be new onset A. fib requiring Cardizem drip, chest x-ray concerning for right upper lobe pneumonia.  Patient admitted to the stepdown unit, placed empirically on IV antibiotics, urinalysis concerning for UTI.  Patient converted to sinus rhythm on Cardizem drip.  Cardiology consulted.  Patient noted to have a elevated troponin of 3493 with concerns from out of hospital non-STEMI.  2D echo pending.  Cardiology consulted and are following.   Assessment & Plan:   Principal Problem:   CAP (community acquired pneumonia) Active Problems:   Osteoporosis   Hypotension   Adrenocortical carcinoma ([place])   New onset atrial fibrillation (H[facility])   Chronic back pain   Generalized weakness   Dehydration   Hypokalemia   Anemia   Adnexal cyst   Non-ST elevation (NSTEMI) myocardial infarction ([place])   Acute lower UTI  1 CAP versus aspiration pneumonia, sepsis ruled out -Patient on presentation met criteria for sepsis with elevated lactic acid level of 2.4, noted to be hypotensive with systolics in the 07M, noted to be tachycardic with heart rates of 136 and in A. fib.  Chest x-ray concerning for right upper lobe pneumonia. -Blood cultures with no growth to date.  Repeat lactic acid level normalized.   -Urinalysis concerning for possible UTI however urine cultures with insignificant growth..   -Urine strep  pneumococcus antigen negative.   -Urine Legionella antigen pending.  -Procalcitonin levels negative.  -Patient seen by speech therapist and diet was advanced to a regular diet which we will change to heart healthy diet in light of non-STEMI.   -Continue IV Zosyn and could likely transition to oral Augmentin tomorrow if continued improvement -Supportive care.   2.  New onset paroxysmal versus transient atrial fibrillation Cha2ds2vasc SCORE 4 -Patient presenting with paroxysmal new onset A. fib likely secondary to probleM #1 versus non-STEMI. -Patient converted to sinus rhythm on 02/19/2021. -TSH within normal limits. -2D echo with a EF of 65 to 70%, NWMA, mild mitral regurgitation, mild aortic insufficiency.  . -Patient seen in consultation by cardiology and patient has been transitioned from diltiazem drip to Toprol-XL.Marland Kitchen  -Continue aspirin. -Patient not a good anticoagulation candidate and also due to first episode of paroxysmal atrial fibrillation and currently back in sinus rhythm. -Per cardiology.  3.  Non-STEMI -Likely out of hospital non-STEMI. -Patient had presented with A. fib cardiac enzymes drawn noted to be significantly elevated with high-sensitivity troponin at 3493>>> 3035. -Patient denies any ongoing chest pain. -2D echo with a EF of 65 to 70%, NWMA, mild mitral regurgitation, mild aortic insufficiency.   -Fasting lipid panel with LDL of 39.  -Patient seen in consultation by cardiology and patient has been transitioned from Cardizem drip and now on oral beta-blocker with aspirin.   -Due to LDL of 39 statin has been discontinued per cardiology.   -Continue medical management per cardiology recommendations. -Cardiology following and appreciate input and recommendations.  4.  Dehydration Hydrated with  IV fluids.  5.  Lactic acidosis Likely secondary to problem #1 versus dehydration.   -Repeat lactic acid level with resolution of lactic acidosis.   -IV fluids discontinued.    -Continue antibiotics.   6.  Hypokalemia -Potassium at 3.1, magnesium at 2.0.   -Potassium from IV fluids discontinued.   -KDur 40 mEq p.o. every 4 hours x2 doses.   -Repeat labs in the morning.   7.  Status post bilateral adrenalectomy -Continue home regimen of Florinef and Cortef.   -IV fluids saline locked. -Follow.  8.  Generalized weakness -Patient with chronic back pain has had some worsening weakness over the past couple of weeks per daughter with decreased oral intake.  Patient with decreased urine output.  Patient clinically dry on examination. -Could potentially be secondary to non-STEMI. -Hydrated with IV fluids.   -Urine cultures with insignificant growth.  -Blood cultures with no growth x2 days.  -Continue treatment with antibiotics.  -Continue medical management for non-STEMI as recommended by cardiology.   -PT/OT.    9.  Left adnexal cyst -Noted on CT renal stone protocol. -We will need pelvic ultrasound and OB/GYN follow-up in the outpatient setting.  10.  Anemia -No overt bleeding. -Anemia panel consistent with anemia of chronic disease.   -Hemoglobin stable at 10.0.   -Transfusion threshold hemoglobin < 7.  -Follow H&H.   11.  UTI versus bacteriuria -Urine cultures with insignificant growth. -Afebrile. -On IV antibiotics.  12.  Agitation -Patient noted to have some episodes of agitation overnight. -Seroquel nightly as needed.    DVT prophylaxis: Lovenox Code Status: DNR Family Communication: Updated patient and daughter at bedside. Disposition:   Status is: Inpatient  Remains inpatient appropriate because:Inpatient level of care appropriate due to severity of illness  Dispo: The patient is from: Home              Anticipated d/c is to:  TBD              Patient currently is not medically stable to d/c.   Difficult to place patient No       Consultants:  Cardiology: Dr. Percival Spanish 02/23/2021  Procedures:  -CT renal stone protocol  03/06/2021 Chest x-ray 03/14/2021 -2D echo 03/17/2021 -CT L-spine 03/14/2021   Antimicrobials:  IV Zosyn 03/08/2021>>>>    Subjective: Sitting up in bed eating her breakfast.  Denies any significant chest pain.  No shortness of breath.  No abdominal pain.  Per RN patient with some agitation overnight.    Objective: Vitals:   03/18/21 0504 03/18/21 0726 03/18/21 0800 03/18/21 0900  BP: (!) 124/48  (!) 148/65 (!) 154/64  Pulse: 79  90 77  Resp: 20  (!) 24 (!) 27  Temp:   98.1 F (36.7 C)   TempSrc:   Axillary   SpO2: 95% 92% 95% 91%  Weight:      Height:        Intake/Output Summary (Last 24 hours) at 03/18/2021 0952 Last data filed at 03/18/2021 0939 Gross per 24 hour  Intake 2149.7 ml  Output 2 ml  Net 2147.7 ml    Filed Weights   03/06/2021 1800 03/18/21 0500  Weight: 48.5 kg 55.1 kg    Examination:  General exam: NAD. Respiratory system: Lungs clear to auscultation bilaterally.  No wheezes, no crackles, no rhonchi.  Fair air movement.  Speaking in full sentences.    Cardiovascular system: RRR no murmurs rubs or gallops.  No JVD.  No lower extremity edema.  Gastrointestinal  system: Abdomen is soft, nontender, nondistended, positive bowel sounds.  No rebound.  No guarding.  Central nervous system: Alert and oriented. No focal neurological deficits. Extremities: Symmetric 5 x 5 power. Skin: No rashes, lesions or ulcers Psychiatry: Judgement and insight appear normal. Mood & affect appropriate.     Data Reviewed: I have personally reviewed following labs and imaging studies  CBC: Recent Labs  Lab 03/11/2021 1416 03/17/21 0312 03/18/21 0318  WBC 8.4 6.4 6.1  NEUTROABS 4.9 4.4 4.0  HGB 10.6* 9.8* 10.0*  HCT 32.7* 30.0* 29.5*  MCV 112.0* 110.7* 109.3*  PLT 147* 114* 119*     Basic Metabolic Panel: Recent Labs  Lab 03/15/2021 1416 03/04/2021 1915 03/17/21 0312 03/18/21 0318  NA 145  --  141 145  K 3.2*  --  4.1 3.1*  CL 110  --  112* 114*  CO2 26  --  25 22   GLUCOSE 115*  --  87 82  BUN 15  --  14 12  CREATININE 0.74  --  0.58 0.54  CALCIUM 8.7*  --  7.7* 8.2*  MG  --  1.7 2.3 2.0     GFR: Estimated Creatinine Clearance: 46 mL/min (by C-G formula based on SCr of 0.54 mg/dL).  Liver Function Tests: Recent Labs  Lab 02/19/2021 1416  AST 36  ALT 21  ALKPHOS 48  BILITOT 1.2  PROT 6.2*  ALBUMIN 2.9*     CBG: No results for input(s): GLUCAP in the last 168 hours.   Recent Results (from the past 240 hour(s))  Blood culture (routine single)     Status: None (Preliminary result)   Collection Time: 03/11/2021  2:17 PM   Specimen: BLOOD  Result Value Ref Range Status   Specimen Description   Final    BLOOD SITE NOT SPECIFIED Performed at [facility], [address] [place], [place] [address]    Special Requests   Final    BOTTLES DRAWN AEROBIC AND ANAEROBIC Blood Culture results may not be optimal due to an excessive volume of blood received in culture bottles Performed at [facility], [address] , [place] [address]    Culture   Final    NO GROWTH 2 DAYS Performed at [facility], [address] , [place] [address]    Report Status PENDING  Incomplete  Resp Panel by RT-PCR (Flu A&B, Covid) Nasopharyngeal Swab     Status: None   Collection Time: 02/20/2021  5:36 PM   Specimen: Nasopharyngeal Swab; Nasopharyngeal(NP) swabs in vial transport medium  Result Value Ref Range Status   SARS Coronavirus 2 by RT PCR NEGATIVE NEGATIVE Final    Comment: (NOTE) SARS-CoV-2 target nucleic acids are NOT DETECTED.  The SARS-CoV-2 RNA is generally detectable in upper respiratory specimens during the acute phase of infection. The lowest concentration of SARS-CoV-2 viral copies this assay can detect is 138 copies/mL. A negative result does not preclude SARS-Cov-2 infection and should not be used as the sole basis for treatment or other patient management decisions. A negative result may occur  with  improper specimen collection/handling, submission of specimen other than nasopharyngeal swab, presence of viral mutation(s) within the areas targeted by this assay, and inadequate number of viral copies(<138 copies/mL). A negative result must be combined with clinical observations, patient history, and epidemiological information. The expected result is Negative.  Fact Sheet for Patients:  https://www.fda.gov/media/152166/download  Fact Sheet for Healthcare Providers:  https://www.fda.gov/media/152162/download  This test   is no t yet approved or cleared by the [place] FDA and  has been authorized for detection and/or diagnosis of SARS-CoV-2 by FDA under an Emergency Use Authorization (EUA). This EUA will remain  in effect (meaning this test can be used) for the duration of the COVID-19 declaration under Section 564(b)(1) of the Act, 21 U.S.C.section 360bbb-3(b)(1), unless the authorization is terminated  or revoked sooner.       Influenza A by PCR NEGATIVE NEGATIVE Final   Influenza B by PCR NEGATIVE NEGATIVE Final    Comment: (NOTE) The Xpert Xpress SARS-CoV-2/FLU/RSV plus assay is intended as an aid in the diagnosis of influenza from Nasopharyngeal swab specimens and should not be used as a sole basis for treatment. Nasal washings and aspirates are unacceptable for Xpert Xpress SARS-CoV-2/FLU/RSV testing.  Fact Sheet for Patients: https://www.fda.gov/media/152166/download  Fact Sheet for Healthcare Providers: https://www.fda.gov/media/152162/download  This test is not yet approved or cleared by the [place] FDA and has been authorized for detection and/or diagnosis of SARS-CoV-2 by FDA under an Emergency Use Authorization (EUA). This EUA will remain in effect (meaning this test can be used) for the duration of the COVID-19 declaration under Section 564(b)(1) of the Act, 21 U.S.C. section 360bbb-3(b)(1), unless the authorization is terminated  or revoked.  Performed at [facility], [address] [place], [place] [address]   Culture, blood (routine x 2) Call MD if unable to obtain prior to antibiotics being given     Status: None (Preliminary result)   Collection Time: 02/23/2021  7:14 PM   Specimen: BLOOD  Result Value Ref Range Status   Specimen Description   Final    BLOOD RIGHT ANTECUBITAL Performed at [facility], [address] [place], [place] [address]    Special Requests   Final    BOTTLES DRAWN AEROBIC AND ANAEROBIC Blood Culture adequate volume Performed at [facility], [address] [place], [place] [address]    Culture   Final    NO GROWTH 2 DAYS Performed at [facility], [address] [place], [place] [address]    Report Status PENDING  Incomplete  Culture, blood (routine x 2) Call MD if unable to obtain prior to antibiotics being given     Status: None (Preliminary result)   Collection Time: 03/14/2021  7:16 PM   Specimen: BLOOD LEFT HAND  Result Value Ref Range Status   Specimen Description   Final    BLOOD LEFT HAND Performed at [facility], [address] [place], [place] [address]    Special Requests   Final    BOTTLES DRAWN AEROBIC AND ANAEROBIC Blood Culture adequate volume Performed at [facility], [address] , [place] [address]    Culture   Final    NO GROWTH 2 DAYS Performed at [facility], [address] , [place] [address]    Report Status PENDING  Incomplete  MRSA Next Gen by PCR, Nasal     Status: None   Collection Time: 02/25/2021  8:53 PM   Specimen: Nasal Mucosa; Nasal Swab  Result Value Ref Range Status   MRSA by PCR Next Gen NOT DETECTED NOT DETECTED Final    Comment: (NOTE) The GeneXpert MRSA Assay (FDA approved for NASAL specimens only), is one component of a comprehensive MRSA colonization surveillance program. It is not intended to  diagnose MRSA infection nor to guide or monitor treatment for MRSA infections.   Test performance is not FDA approved in patients less than 67 years old. Performed at [facility], [place] [address] [place], [place] [address]   Urine Culture     Status: Abnormal   Collection Time: 03/17/21  3:01 AM   Specimen: Urine, Catheterized  Result Value Ref Range Status   Specimen Description   Final    URINE, CATHETERIZED Performed at [facility] [address], [place], [place] [address]    Special Requests   Final    NONE Performed at [facility], [place] [address], [place], [place] [address]    Culture (A)  Final    <10,000 COLONIES/mL INSIGNIFICANT GROWTH Performed at [facility] [address], [place],  [address]    Report Status 03/18/2021 FINAL  Final          Radiology Studies: DG Chest 1 View  Result Date: 03/12/2021 CLINICAL DATA:  Weakness and right flank pain. EXAM: CHEST  1 VIEW COMPARISON:  Chest x-ray 02/06/2015.  CT chest 05/09/2020. FINDINGS: Postsurgical changes in the left upper lobe appear stable. There is increasing right upper lobe/perihilar airspace disease. There is a band of opacity in the right upper lobe similar to the prior study. A large hiatal hernia is again seen. There are atherosclerotic calcifications of the aorta. Heart size is within normal limits. The costophrenic angles are clear. There is no pneumothorax. No acute fractures are seen. IMPRESSION: 1. New right upper lobe/perihilar airspace disease. Findings may be related to infection. Other etiologies such as neoplasm cannot be excluded given patient's history. 2. Stable postsurgical changes in the left lung. Electronically Signed   By: Ronney Asters M.D.   On: 02/23/2021 15:06   CT L-SPINE NO CHARGE  Result Date: 03/08/2021 CLINICAL DATA:  Back pain. Worsening weakness for 1 week with right flank pain. EXAM: CT LUMBAR SPINE WITHOUT  CONTRAST TECHNIQUE: Multidetector CT imaging of the lumbar spine was performed without intravenous contrast administration. Multiplanar CT image reconstructions were also generated. COMPARISON:  Lumbar spine MRI 09/05/2012 FINDINGS: Segmentation: The lowest fully formed intervertebral disc space is L5-S1. There are no ribs at T12. Alignment: Chronic straightening of the normal lumbar lordosis. No listhesis. Vertebrae: Unchanged chronic T12 and L1 compression fractures. No acute fracture or suspicious osseous lesion. Scattered small Schmorl's nodes. Diffuse osteopenia. Paraspinal and other soft tissues: No acute abnormality identified in the paraspinal soft tissues. Intra-and pelvic contents reported separately. Disc levels: Similar appearance of diffuse lumbar disc and facet degeneration compared to the prior MRI, most notable at L4-5 where there is moderate spinal stenosis, moderate bilateral lateral recess stenosis, and moderate right neural foraminal stenosis. Unchanged moderate left greater than right neural foraminal stenosis at L5-S1. IMPRESSION: 1. No acute osseous abnormality identified in the lumbar spine. 2. Unchanged chronic T12 and L1 compression fractures. 3. Unchanged lumbar disc and facet degeneration. Electronically Signed   By: Logan Bores M.D.   On: 02/17/2021 14:43   ECHOCARDIOGRAM COMPLETE  Result Date: 03/17/2021    ECHOCARDIOGRAM REPORT   Patient Name:   JACKALYNN ART Date of Exam: 03/17/2021 Medical Rec #:  [id number]    Height:       64.0 in Accession #:    [id number]   Weight:       106.9 lb Date of Birth:  20-Nov-1934   BSA:          1.499 m Patient Age:    58 years     BP:  133/30 mmHg Patient Gender: F            HR:           78 bpm. Exam Location:  Inpatient Procedure: 2D Echo, Color Doppler and Cardiac Doppler Indications:    I48.91* Unspecified atrial fibrillation  History:        Patient has no prior history of Echocardiogram examinations.                 Lung Cancer.   Sonographer:    Raquel Sarna Senior RDCS Referring Phys: [place]  Sonographer Comments: Technically difficult due to small rib spacing. IMPRESSIONS  1. Left ventricular ejection fraction, by estimation, is 65 to 70%. The left ventricle has normal function. The left ventricle has no regional wall motion abnormalities. There is moderate left ventricular hypertrophy of the basal-septal segment. Left ventricular diastolic parameters are consistent with Grade I diastolic dysfunction (impaired relaxation). Elevated left ventricular end-diastolic pressure.  2. Right ventricular systolic function is normal. The right ventricular size is normal. There is normal pulmonary artery systolic pressure.  3. The mitral valve is degenerative. Mild mitral valve regurgitation. No evidence of mitral stenosis.  4. The aortic valve is normal in structure. Aortic valve regurgitation is mild. No aortic stenosis is present.  5. The inferior vena cava is normal in size with greater than 50% respiratory variability, suggesting right atrial pressure of 3 mmHg. FINDINGS  Left Ventricle: Left ventricular ejection fraction, by estimation, is 65 to 70%. The left ventricle has normal function. The left ventricle has no regional wall motion abnormalities. The left ventricular internal cavity size was normal in size. There is  moderate left ventricular hypertrophy of the basal-septal segment. Left ventricular diastolic parameters are consistent with Grade I diastolic dysfunction (impaired relaxation). Elevated left ventricular end-diastolic pressure. Right Ventricle: The right ventricular size is normal. No increase in right ventricular wall thickness. Right ventricular systolic function is normal. There is normal pulmonary artery systolic pressure. The tricuspid regurgitant velocity is 2.71 m/s, and  with an assumed right atrial pressure of 3 mmHg, the estimated right ventricular systolic pressure is 32.6 mmHg. Left Atrium: Left atrial size  was normal in size. Right Atrium: Right atrial size was normal in size. Pericardium: There is no evidence of pericardial effusion. Mitral Valve: The mitral valve is degenerative in appearance. There is mild thickening of the mitral valve leaflet(s). There is mild calcification of the mitral valve leaflet(s). Mild mitral annular calcification. Mild mitral valve regurgitation. No evidence of mitral valve stenosis. MV peak gradient, 7.3 mmHg. The mean mitral valve gradient is 4.0 mmHg. Tricuspid Valve: The tricuspid valve is normal in structure. Tricuspid valve regurgitation is mild . No evidence of tricuspid stenosis. Aortic Valve: The aortic valve is normal in structure. Aortic valve regurgitation is mild. No aortic stenosis is present. Pulmonic Valve: The pulmonic valve was normal in structure. Pulmonic valve regurgitation is not visualized. No evidence of pulmonic stenosis. Aorta: The aortic root is normal in size and structure. Venous: The inferior vena cava is normal in size with greater than 50% respiratory variability, suggesting right atrial pressure of 3 mmHg. IAS/Shunts: No atrial level shunt detected by color flow Doppler.  LEFT VENTRICLE PLAX 2D LVIDd:         2.30 cm  Diastology LVIDs:         1.50 cm  LV e' medial:    4.68 cm/s LV PW:         1.10 cm  LV E/e' medial:  26.5 LV IVS:        1.40 cm  LV e' lateral:   5.98 cm/s LVOT diam:     1.80 cm  LV E/e' lateral: 20.7 LV SV:         79 LV SV Index:   52 LVOT Area:     2.54 cm  RIGHT VENTRICLE RV S prime:     17.70 cm/s TAPSE (M-mode): 2.3 cm LEFT ATRIUM             Index       RIGHT ATRIUM           Index LA diam:        2.50 cm 1.67 cm/m  RA Area:     11.80 cm LA Vol (A2C):   62.5 ml 41.69 ml/m RA Volume:   23.00 ml  15.34 ml/m LA Vol (A4C):   34.0 ml 22.68 ml/m LA Biplane Vol: 49.6 ml 33.08 ml/m  AORTIC VALVE LVOT Vmax:   138.00 cm/s LVOT Vmean:  96.800 cm/s LVOT VTI:    0.309 m  AORTA Ao Root diam: 3.20 cm Ao Asc diam:  3.40 cm MITRAL VALVE                 TRICUSPID VALVE MV Area (PHT): 2.44 cm     TR Peak grad:   29.4 mmHg MV Area VTI:   2.28 cm     TR Vmax:        271.00 cm/s MV Peak grad:  7.3 mmHg MV Mean grad:  4.0 mmHg     SHUNTS MV Vmax:       1.35 m/s     Systemic VTI:  0.31 m MV Vmean:      95.4 cm/s    Systemic Diam: 1.80 cm MV Decel Time: 311 msec MV E velocity: 124.00 cm/s MV A velocity: 139.00 cm/s MV E/A ratio:  0.89 Traci Turner MD Electronically signed by Traci Turner MD Signature Date/Time: 03/17/2021/1:06:00 PM    Final    CT Renal Stone Study  Result Date: 02/21/2021 CLINICAL DATA:  Flank pain in an 85-year-old female for 1 week, difficulty ambulating. EXAM: CT ABDOMEN AND PELVIS WITHOUT CONTRAST TECHNIQUE: Multidetector CT imaging of the abdomen and pelvis was performed following the standard protocol without IV contrast. COMPARISON:  Chest x-ray and lumbar spine evaluation of the same date, CT of the chest, abdomen and pelvis from 2016. FINDINGS: Lower chest: Large hiatal hernia extends into the chest, greater than 50% of the stomach has herniated into the chest as on previous studies. Three-vessel coronary artery disease. No substantial pericardial effusion. Pulmonary emphysema.  No pleural effusion or consolidation. Hepatobiliary: Liver with smooth contours. No pericholecystic stranding. No gross biliary duct distension. Pancreas: Pancreatic atrophy, no signs of inflammation. Spleen: Normal. Adrenals/Urinary Tract: Post bilateral adrenalectomy. Nephrolithiasis on the LEFT, small intrarenal calculi largest in the lower pole measuring approximately 4 mm. No definite ureteral calculi, no signs of hydronephrosis or perinephric stranding. There are numerous phleboliths in the pelvis that displays similar distribution and appearance compared to prior imaging. Urinary bladder is collapsed. Stomach/Bowel: Much of the stomach again is herniated into the chest. No acute small bowel process. Fluid-filled small bowel loops of predominantly  distal small bowel without substantial distension and without adjacent stranding. Moderate stool in the rectum without perirectal stranding. Colonic diverticulosis throughout the colon. No signs of colonic inflammation. Appendix not visualized, no secondary signs to suggest appendicitis. Small subcostal abdominal wall   laxity contains portion of the descending colon without sign of obstruction or inflammation. Vascular/Lymphatic: Atheromatous plaque of the abdominal aorta without aneurysmal dilation. There is no gastrohepatic or hepatoduodenal ligament lymphadenopathy. No retroperitoneal or mesenteric lymphadenopathy. No pelvic sidewall lymphadenopathy. Reproductive: Cystic lesion LEFT adnexa 3.5 x 2.5 x 3.5 cm Other: No free intra-abdominal air.  No ascites. Musculoskeletal: Osteopenia. Spinal degenerative changes. No acute bone finding or destructive bone process. T12 and L1 with loss of height, chronic and better evaluated on dedicated spinal recons, reported separately. IMPRESSION: No acute intra-abdominal pathology.  Post bilateral adrenalectomy. Cystic lesion LEFT adnexa 3.5 x 2.5 x 3.5 cm. 3.5 cm left adnexal simple-appearing cyst, not adequately characterized. Recommend prompt follow-up with pelvic US, this could be performed on a nonemergent basis. Reference: JACR 2020 Feb;17(2):248-254 Large hiatal hernia as on previous imaging. Nephrolithiasis without signs of hydronephrosis or perinephric stranding. Three-vessel coronary artery disease. Aortic Atherosclerosis (ICD10-I70.0) and Emphysema (ICD10-J43.9). Electronically Signed   By: Zetta Bills M.D.   On: 02/23/2021 15:07        Scheduled Meds:  aspirin EC  81 mg Oral Daily   Chlorhexidine Gluconate Cloth  6 each Topical Daily   enoxaparin (LOVENOX) injection  30 mg Subcutaneous Q24H   feeding supplement  237 mL Oral TID BM   fludrocortisone  0.1 mg Oral Q24H   gabapentin  300 mg Oral QHS   guaiFENesin  1,200 mg Oral BID   hydrocortisone  10  mg Oral Q24H   hydrocortisone  5 mg Oral Q24H   ipratropium  0.5 mg Nebulization BID   latanoprost  1 drop Both Eyes QHS   levalbuterol  0.63 mg Nebulization BID   metoprolol succinate  12.5 mg Oral Daily   multivitamin with minerals  1 tablet Oral Daily   pantoprazole  40 mg Oral Daily   potassium chloride  40 mEq Oral Once   sodium chloride flush  3 mL Intravenous Q12H   Continuous Infusions:  sodium chloride 10 mL/hr at 03/18/21 0939   piperacillin-tazobactam (ZOSYN)  IV Stopped (03/18/21 0903)     LOS: 2 days    Time spent: 40 minutes    Irine Seal, MD [facility]   To contact the attending provider between 7A-7P or the covering provider during after hours 7P-7A, please log into the web site [email or web address] and access using universal [place] password for that web site. If you do not have the password, please call the hospital operator.  03/18/2021, 9:52 AM

## 2021-03-19 ENCOUNTER — Encounter (HOSPITAL_COMMUNITY): Payer: Self-pay | Admitting: Internal Medicine

## 2021-03-19 DIAGNOSIS — C7400 Malignant neoplasm of cortex of unspecified adrenal gland: Secondary | ICD-10-CM | POA: Diagnosis not present

## 2021-03-19 DIAGNOSIS — N949 Unspecified condition associated with female genital organs and menstrual cycle: Secondary | ICD-10-CM | POA: Diagnosis not present

## 2021-03-19 DIAGNOSIS — I480 Paroxysmal atrial fibrillation: Secondary | ICD-10-CM | POA: Diagnosis not present

## 2021-03-19 DIAGNOSIS — N390 Urinary tract infection, site not specified: Secondary | ICD-10-CM | POA: Diagnosis not present

## 2021-03-19 DIAGNOSIS — I4891 Unspecified atrial fibrillation: Secondary | ICD-10-CM | POA: Diagnosis not present

## 2021-03-19 LAB — CBC
HCT: 27.8 % — ABNORMAL LOW (ref 36.0–46.0)
Hemoglobin: 9.2 g/dL — ABNORMAL LOW (ref 12.0–15.0)
MCH: 36.4 pg — ABNORMAL HIGH (ref 26.0–34.0)
MCHC: 33.1 g/dL (ref 30.0–36.0)
MCV: 109.9 fL — ABNORMAL HIGH (ref 80.0–100.0)
Platelets: 123 10*3/uL — ABNORMAL LOW (ref 150–400)
RBC: 2.53 MIL/uL — ABNORMAL LOW (ref 3.87–5.11)
RDW: 13.6 % (ref 11.5–15.5)
WBC: 6.4 10*3/uL (ref 4.0–10.5)
nRBC: 0.3 % — ABNORMAL HIGH (ref 0.0–0.2)

## 2021-03-19 LAB — BASIC METABOLIC PANEL
Anion gap: 6 (ref 5–15)
BUN: 14 mg/dL (ref 8–23)
CO2: 25 mmol/L (ref 22–32)
Calcium: 8.1 mg/dL — ABNORMAL LOW (ref 8.9–10.3)
Chloride: 111 mmol/L (ref 98–111)
Creatinine, Ser: 0.62 mg/dL (ref 0.44–1.00)
GFR, Estimated: >60 mL/min (ref >60)
Glucose, Bld: 99 mg/dL (ref 70–99)
Potassium: 3.5 mmol/L (ref 3.5–5.1)
Sodium: 142 mmol/L (ref 135–145)

## 2021-03-19 LAB — MAGNESIUM: Magnesium: 2.2 mg/dL (ref 1.7–2.4)

## 2021-03-19 MED ORDER — MENTHOL 3 MG MT LOZG
1.0000 | LOZENGE | OROMUCOSAL | Status: DC | PRN
  Filled 2021-03-19: qty 9

## 2021-03-19 MED ORDER — LOPERAMIDE HCL 2 MG PO CAPS: 2.0000 mg | ORAL_CAPSULE | ORAL | Status: DC | PRN

## 2021-03-19 MED ORDER — AMOXICILLIN-POT CLAVULANATE 875-125 MG PO TABS
1.0000 | ORAL_TABLET | Freq: Two times a day (BID) | ORAL | Status: DC
  Administered 2021-03-19 (×2): 1 via ORAL
  Filled 2021-03-19 (×2): qty 1

## 2021-03-19 MED ORDER — LEVALBUTEROL HCL 0.63 MG/3ML IN NEBU: 0.6300 mg | INHALATION_SOLUTION | Freq: Four times a day (QID) | RESPIRATORY_TRACT | Status: DC | PRN

## 2021-03-19 MED ORDER — POTASSIUM CHLORIDE CRYS ER 10 MEQ PO TBCR
40.0000 meq | EXTENDED_RELEASE_TABLET | Freq: Once | ORAL | Status: AC
  Administered 2021-03-19: 40 meq via ORAL
  Filled 2021-03-19: qty 4

## 2021-03-19 MED ORDER — LOPERAMIDE HCL 2 MG PO CAPS
4.0000 mg | ORAL_CAPSULE | Freq: Once | ORAL | Status: AC
  Administered 2021-03-19: 4 mg via ORAL
  Filled 2021-03-19: qty 2

## 2021-03-19 MED ORDER — IPRATROPIUM BROMIDE 0.02 % IN SOLN: 0.5000 mg | Freq: Four times a day (QID) | RESPIRATORY_TRACT | Status: DC | PRN

## 2021-03-19 NOTE — Progress Notes (Signed)
Progress Note  Patient Name: Leslie Petersen Date of Encounter: 03/19/2021  [facility] Cardiologist: Minus Breeding, MD   Subjective   PT's breathing is OK   No CP    Inpatient Medications    Scheduled Meds:  acetaminophen  500 mg Oral TID   amoxicillin-clavulanate  1 tablet Oral Q12H   aspirin EC  81 mg Oral Daily   Chlorhexidine Gluconate Cloth  6 each Topical Daily   enoxaparin (LOVENOX) injection  30 mg Subcutaneous Q24H   feeding supplement  237 mL Oral TID BM   fludrocortisone  0.1 mg Oral Q24H   gabapentin  300 mg Oral QHS   guaiFENesin  1,200 mg Oral BID   hydrocortisone  10 mg Oral Q24H   hydrocortisone  5 mg Oral Q24H   latanoprost  1 drop Both Eyes QHS   metoprolol succinate  12.5 mg Oral Daily   multivitamin with minerals  1 tablet Oral Daily   pantoprazole  40 mg Oral Daily   phenazopyridine  100 mg Oral TID WC   sodium chloride flush  3 mL Intravenous Q12H   Continuous Infusions:  PRN Meds: acetaminophen **OR** acetaminophen, gabapentin, guaiFENesin, ipratropium, ketorolac, levalbuterol, loperamide, menthol-cetylpyridinium, polyethylene glycol, prochlorperazine, QUEtiapine, sorbitol   Vital Signs    Vitals:   03/19/21 1300 03/19/21 1400 03/19/21 1449 03/19/21 1955  BP: (!) 138/52 (!) 152/49 (!) 154/79 130/90  Pulse: 80 89 88 88  Resp: (!) 25 20 20 18   Temp:   98.9 F (37.2 C) 98.1 F (36.7 C)  TempSrc:   Oral Oral  SpO2: 90% 91% 94% 93%  Weight:    58.3 kg  Height:        Intake/Output Summary (Last 24 hours) at 03/19/2021 2246 Last data filed at 03/19/2021 1300 Gross per 24 hour  Intake 218 ml  Output --  Net 218 ml   Last 3 Weights 03/19/2021 03/19/2021 03/18/2021  Weight (lbs) 128 lb 8.5 oz 103 lb 13.4 oz 121 lb 7.6 oz  Weight (kg) 58.3 kg 47.1 kg 55.1 kg      Telemetry    SR - Personally Reviewed  ECG    No new  - Personally Reviewed  Physical Exam   Gen    Pt I NAD     Neck: No JVD Cardiac: RRR, no murmurs Respiratory:  Clear to auscultation bilaterally. GI: Soft, nontender, non-distended  MS: No edema; No deformity. Neuro:  Nonfocal  Psych: Normal affect   Labs    High Sensitivity Troponin:   Recent Labs  Lab 03/03/2021 1915 03/05/2021 2139  TROPONINIHS 3,493* 3,035*     Chemistry Recent Labs  Lab 02/18/2021 1416 02/23/2021 1915 03/17/21 0312 03/18/21 0318 03/19/21 0315  NA 145  --  141 145 142  K 3.2*  --  4.1 3.1* 3.5  CL 110  --  112* 114* 111  CO2 26  --  25 22 25   GLUCOSE 115*  --  87 82 99  BUN 15  --  14 12 14   CREATININE 0.74  --  0.58 0.54 0.62  CALCIUM 8.7*  --  7.7* 8.2* 8.1*  MG  --    < > 2.3 2.0 2.2  PROT 6.2*  --   --   --   --   ALBUMIN 2.9*  --   --   --   --   AST 36  --   --   --   --   ALT 21  --   --   --   --  ALKPHOS 48  --   --   --   --   BILITOT 1.2  --   --   --   --   GFRNONAA >60  --  >60 >60 >60  ANIONGAP 9  --  4* 9 6   < > = values in this interval not displayed.    Lipids  Recent Labs  Lab 03/18/21 0318  CHOL 83  TRIG 56  HDL 33*  LDLCALC 39  CHOLHDL 2.5    Hematology Recent Labs  Lab 03/17/21 0312 03/18/21 0318 03/19/21 0315  WBC 6.4 6.1 6.4  RBC 2.71* 2.70* 2.53*  HGB 9.8* 10.0* 9.2*  HCT 30.0* 29.5* 27.8*  MCV 110.7* 109.3* 109.9*  MCH 36.2* 37.0* 36.4*  MCHC 32.7 33.9 33.1  RDW 13.2 13.3 13.6  PLT 114* 119* 123*   Thyroid  Recent Labs  Lab 03/03/2021 1915  TSH 2.628    BNPNo results for input(s): BNP, PROBNP in the last 168 hours.  DDimer No results for input(s): DDIMER in the last 168 hours.   Radiology    No results found.  Cardiac Studies    Echo    Oct 1 22  Left ventricular ejection fraction, by estimation, is 65 to 70%. The left ventricle has normal function. The left ventricle has no regional wall motion abnormalities. There is moderate left ventricular hypertrophy of the basal-septal segment. Left ventricular diastolic parameters are consistent with Grade I diastolic dysfunction (impaired relaxation).  Elevated left ventricular end-diastolic pressure. 1. Right ventricular systolic function is normal. The right ventricular size is normal. There is normal pulmonary artery systolic pressure. 2. The mitral valve is degenerative. Mild mitral valve regurgitation. No evidence of mitral stenosis. 3. The aortic valve is normal in structure. Aortic valve regurgitation is mild. No aortic stenosis is present. 4. The inferior vena cava is normal in size with greater than 50% respiratory variability, suggesting right atrial pressure of 3 mmHg.  Patient Profile       Leslie Petersen is a 86 y.o. female without a prior cardiac history who is being seen today for follow up of atrial fib   Assessment & Plan   1   PAF   Pt remains in SR    Continue tele  2   NSTEMI     Pt's peak trponin 3493 Pt with 3 V dz.   Felt due to spepsis.    Pt CP free  COnintue medical Rx.  For questions or updates, please contact [place] Please consult [email or web address] for contact info under        Signed, Dorris Carnes, MD  03/19/2021, 10:46 PM

## 2021-03-19 NOTE — Progress Notes (Signed)
Patient slept almost all night. Incontinent of urine and had to be cleaned as she didn't keep purewick in place. Bath given with full linen change. Bed weight lighter today than earlier, all equipment off bed weight times 2. Patient did not complain of pain all night, no signs of discomfort while sleeping.

## 2021-03-19 NOTE — TOC Progression Note (Signed)
Transition of Care [facility]) - Progression Note    Patient Details  Name: Leslie Petersen MRN: [id number] Date of Birth: March 24, 1935  Transition of [facility]) CM/SW Contact  Purcell Mouton, RN Phone Number: 03/19/2021, 1:22 PM  Clinical Narrative:    Spoke with daughter Leslie Petersen concerning discharge plans. Leslie Petersen states that she is uncomfortable with pt coming home because pt is not walking or getting up out of the pt and continues to have BM in the bed here at the hospital. Leslie Petersen is worried that her mother pt will not do PT when she go home. Explained to [place] that PT would make evaluation for HHPT or SNF. It would benefit if PT could see pt before discharge for disposition. Explained [place] services to [place] as well. Pt will need BSC at discharge, she has Rolator at home.     Expected Discharge Plan: [place] Barriers to Discharge: No Barriers Identified  Expected Discharge Plan and Services Expected Discharge Plan: [place]   Discharge Planning Services: CM Consult Post Acute Care Choice: [place] arrangements for the past 2 months: Single Family Home                           HH Arranged: RN, PT, OT, Nurse's Aide HH Agency: [place] Date [place]: 03/18/21 Time [place]: 1227 Representative spoke with at [place] ([place]) Interventions    Readmission Risk Interventions No flowsheet data found.

## 2021-03-19 NOTE — Progress Notes (Signed)
Chaplain engaged in an initial visit with Reida and her daughter.  Chaplain learned that they are originally from [place] and moved to the [place] for Jordana's job in the 33's.  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  Daughter described Jennings as being very easygoing, not hard to please, great at budgeting, and not materialistic.  They currently live together and she has been Jane' caretaker.   Daughter also detailed that Sarika has dealt with back issues for quite some time and would regularly go to a chiropractor for help.  She was unaware that on top of her recent back trouble that she also had pneumonia and a UTI.    Chaplain offered listening, support and presence.     03/19/21 1100  Clinical Encounter Type  Visited With Patient and family together  Visit Type Initial

## 2021-03-19 NOTE — Progress Notes (Signed)
Physical Therapy Treatment Patient Details Name: Leslie Petersen MRN: [id number] DOB: [birth year] Today's Date: 03/19/2021   History of Present Illness Patient is a 86 year old female who presented to the hosptial on 9/30 with decreased oral intake and increased weakness. Patient was found to have CAP v.s. aspiration pneumonia, new onset of A fib and non STEMI. 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  She was self able to get OOB and amb 1/2 ICU unit without any physical Assistance.   General Gait Details: first used the Rollator.  Pt was able to amb a good distance safely.  Poor forward flex posture with inability to self correct thus Rollator way too far out front.  Pt does NOT use in her home so next amb without any AD.  Increased unsteadiness and decreased gait speed.  Pt appears to be a furnitur walker.  No overt LOB.  Tolerated distance well, no dyspnea.  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   Per chart review, daughter is asking about SNF.  However, pt declines and does not show a need.  Agree with evaluating LPT for [facility] PT.   Recommendations for follow up therapy are one component of a multi-disciplinary discharge planning process, led by the attending physician.  Recommendations may be updated based on patient status, additional functional criteria and insurance authorization.  Follow Up Recommendations  Home health PT;Supervision for mobility/OOB     Equipment Recommendations  None recommended by PT    Recommendations for Other Services       Precautions / Restrictions Precautions Precautions: Fall Precaution Comments: kyphotic     Mobility  Bed Mobility Overal bed mobility: Needs Assistance Bed Mobility: Supine to Sit;Sit to Supine     Supine to sit: Supervision Sit to supine: Supervision    General bed mobility comments: pt self able with increased time    Transfers Overall transfer level: Needs assistance Equipment used: 4-wheeled walker;None Transfers: Sit to/from [facility] Sit to Stand: Supervision Stand pivot transfers: Supervision       General transfer comment: good safety cognition and use of hands to steady self  Ambulation/Gait Ambulation/Gait assistance: Supervision;Min guard Gait Distance (Feet): 255 Feet Assistive device: 4-wheeled walker;None Gait Pattern/deviations: Step-through pattern;Decreased stride length;Trunk flexed     General Gait Details: first used the Rollator.  Pt was able to amb a good distance safely.  Poor forward flex posture with inability to self correct thus Rollator way too far out front.  Pt does NOT use in her home so next amb without any AD.  Increased unsteadiness and decreased gait speed.  Pt appears to be a furnitur walker.  No overt LOB.  Tolerated distance well, no dyspnea.  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)       Balance                                            Cognition   Behavior During Therapy: WFL for tasks assessed/performed Overall Cognitive Status: Impaired/Different from baseline  General Comments: AxO x 3 very pleasant      Exercises      General Comments        Pertinent Vitals/Pain Pain Assessment: No/denies pain    Home Living                      Prior Function            PT Goals (current goals can now be found in the care plan section) Progress towards PT goals: Progressing toward goals    Frequency    Min 3X/week      PT Plan Current plan remains appropriate    Co-evaluation              AM-PAC PT "6 Clicks" Mobility   Outcome Measure  Help needed turning from your  back to your side while in a flat bed without using bedrails?: A Little Help needed moving from lying on your back to sitting on the side of a flat bed without using bedrails?: A Little Help needed moving to and from a bed to a chair (including a wheelchair)?: A Little Help needed standing up from a chair using your arms (e.g., wheelchair or bedside chair)?: A Little Help needed to walk in hospital room?: A Little Help needed climbing 3-5 steps with a railing? : A Little 6 Click Score: 18    End of Session Equipment Utilized During Treatment: Gait belt Activity Tolerance: Patient tolerated treatment well Patient left: in bed;with call bell/phone within reach;with bed alarm set Nurse Communication: Mobility status PT Visit Diagnosis: Other abnormalities of gait and mobility (R26.89);Muscle weakness (generalized) (M62.81)     Time: 6237-6283 PT Time Calculation (min) (ACUTE ONLY): 12 min  Charges:  $Gait Training: 8-22 mins                     Rica Koyanagi  PTA Acute  Rehabilitation Services Pager      [phone] Office      [phone]

## 2021-03-19 NOTE — Progress Notes (Signed)
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  Patient seen in the ED noted to have elevated lactic acid level, noted to be new onset A. fib requiring Cardizem drip, chest x-ray concerning for right upper lobe pneumonia.  Patient admitted to the stepdown unit, placed empirically on IV antibiotics, urinalysis concerning for UTI.  Patient converted to sinus rhythm on Cardizem drip.  Cardiology consulted.  Patient noted to have a elevated troponin of 3493 with concerns from out of hospital non-STEMI.  2D echo pending.  Cardiology consulted and are following.   Assessment & Plan:   Principal Problem:   CAP (community acquired pneumonia) Active Problems:   Osteoporosis   Hypotension   Adrenocortical carcinoma ([place])   New onset atrial fibrillation (HCC)   Chronic back pain   Generalized weakness   Dehydration   Hypokalemia   Anemia   Adnexal cyst   Non-ST elevation (NSTEMI) myocardial infarction ([place])   Acute lower UTI  1 CAP versus aspiration pneumonia, sepsis ruled out -Patient on presentation met criteria for sepsis with elevated lactic acid level of 2.4, noted to be hypotensive with systolics in the 80K, noted to be tachycardic with heart rates of 136 and in A. fib.  Chest x-ray concerning for right upper lobe pneumonia. -Blood cultures with no growth to date.  Repeat lactic acid level normalized.   -Urinalysis concerning for possible UTI however urine cultures with insignificant growth..   -Urine strep  pneumococcus antigen negative.   -Urine Legionella antigen pending.  -Procalcitonin levels negative.  -Patient seen by speech therapist and diet was advanced to a regular diet which we will change to heart healthy diet in light of non-STEMI.   -Transition from IV Zosyn to oral Augmentin to complete a 5 to 7-day course of antibiotic treatment.   -Supportive care.   2.  New onset paroxysmal versus transient atrial fibrillation Cha2ds2vasc SCORE 4 -Patient presenting with paroxysmal new onset A. fib likely secondary to probleM #1 versus non-STEMI. -Patient converted to sinus rhythm on 02/16/2021. -TSH within normal limits. -2D echo with a EF of 65 to 70%, NWMA, mild mitral regurgitation, mild aortic insufficiency.  . -Patient seen in consultation by cardiology and patient has been transitioned from diltiazem drip to Toprol-XL.Marland Kitchen  -Continue aspirin. -Patient not a good anticoagulation candidate and also due to first episode of paroxysmal atrial fibrillation and currently back in sinus rhythm. -Per cardiology.  3.  Non-STEMI -Likely out of hospital non-STEMI. -Patient had presented with A. fib cardiac enzymes drawn noted to be significantly elevated with high-sensitivity troponin at 3493>>> 3035. -Patient denies any ongoing chest pain. -2D echo with a EF of 65 to 70%, NWMA, mild mitral regurgitation, mild aortic insufficiency.   -Fasting lipid panel with LDL of 39.  -Patient seen in consultation by cardiology and patient has been transitioned from Cardizem drip and now on oral beta-blocker with aspirin.   -Due to LDL of 39 statin has been discontinued per cardiology.   -Continue medical management per cardiology recommendations. -Cardiology following and appreciate input and recommendations.  4.  Dehydration Hydrated with IV fluids.  5.  Lactic acidosis Likely secondary to problem #1 versus dehydration.   -Repeat lactic acid level with resolution of lactic acidosis.   -IV fluids  discontinued.   -Continue antibiotics.   6.  Hypokalemia -Potassium at 3.5, magnesium at 2.2.   -Potassium from IV fluids discontinued.   -KDur 40 mEq p.o. x1 doses.   -Repeat labs in the morning.   7.  Status post bilateral adrenalectomy -Continue home regimen of Florinef and Cortef.   -IV fluids saline locked. -Follow.  8.  Generalized weakness -Patient with chronic back pain has had some worsening weakness over the past couple of weeks per daughter with decreased oral intake.  Patient with decreased urine output.  Patient clinically dry on examination on presentation that has improved with hydration. -Could potentially be secondary to non-STEMI. -Hydrated with IV fluids.   -Urine cultures with insignificant growth.  -Blood cultures with no growth x3 days.  -Continue treatment with antibiotics.  -Continue medical management for non-STEMI as recommended by cardiology.   -PT/OT.    9.  Left adnexal cyst -Noted on CT renal stone protocol. -We will need pelvic ultrasound and OB/GYN follow-up in the outpatient setting.  10.  Anemia -No overt bleeding. -Anemia panel consistent with anemia of chronic disease.   -Hemoglobin stable at 9.2 -Transfusion threshold hemoglobin < 7.  -Follow H&H.   11.  UTI versus bacteriuria -Urine cultures with insignificant growth. -Afebrile. -On antibiotics.  12.  Agitation -Patient noted to have some episodes of agitation earlier on during the hospitalization. -None noted overnight. -Seroquel nightly as needed.    DVT prophylaxis: Lovenox Code Status: DNR Family Communication: Updated patient and daughter at bedside. Disposition:   Status is: Inpatient  Remains inpatient appropriate because:Inpatient level of care appropriate due to severity of illness  Dispo: The patient is from: Home              Anticipated d/c is to:  TBD              Patient currently is not medically stable to d/c.   Difficult to place patient No        Consultants:  Cardiology: Dr. Percival Spanish 03/07/2021  Procedures:  -CT renal stone protocol 03/10/2021 Chest x-ray 02/23/2021 -2D echo 03/17/2021 -CT L-spine 02/22/2021   Antimicrobials:  IV Zosyn 02/25/2021>>>> 03/19/2021 Augmentin 03/19/2021 >>>   Subjective: Patient laying in bed getting cleaned up.  Still with cough per daughter.  No chest pain.  No significant shortness of breath.  No abdominal pain.  Overall feeling better than she did on admission.  Wanting to go home and not to a SNF.    Objective: Vitals:   03/19/21 0200 03/19/21 0400 03/19/21 0500 03/19/21 0600  BP: (!) 121/55 (!) 129/57  (!) 143/56  Pulse: (!) 54 63  62  Resp: 17 (!) 24  (!) 23  Temp:  (!) 97.4 F (36.3 C)    TempSrc:  Oral    SpO2: (!) 84% (!) 89%  91%  Weight:   47.1 kg   Height:        Intake/Output Summary (Last 24 hours) at 03/19/2021 0928 Last data filed at 03/18/2021 2239 Gross per 24 hour  Intake 937.65 ml  Output 140 ml  Net 797.65 ml    Filed Weights   02/17/2021 1800 03/18/21 0500 03/19/21 0500  Weight: 48.5 kg 55.1 kg 47.1 kg    Examination:  General exam: NAD Respiratory system: Some  coarse breath sounds anterior lung fields.  No wheezes, no crackles.  Fair air movement.  Speaking in full sentences.      Cardiovascular system: Regular rate rhythm no murmurs rubs or gallops.  No JVD.  No lower extremity edema. Gastrointestinal system: Abdomen is soft, nontender, nondistended, positive bowel sounds.  No rebound.  No guarding.  Central nervous system: Alert and oriented. No focal neurological deficits. Extremities: Symmetric 5 x 5 power. Skin: No rashes, lesions or ulcers Psychiatry: Judgement and insight appear normal. Mood & affect appropriate.     Data Reviewed: I have personally reviewed following labs and imaging studies  CBC: Recent Labs  Lab 02/27/2021 1416 03/17/21 0312 03/18/21 0318 03/19/21 0315  WBC 8.4 6.4 6.1 6.4  NEUTROABS 4.9 4.4 4.0  --   HGB 10.6* 9.8* 10.0*  9.2*  HCT 32.7* 30.0* 29.5* 27.8*  MCV 112.0* 110.7* 109.3* 109.9*  PLT 147* 114* 119* 123*     Basic Metabolic Panel: Recent Labs  Lab 03/05/2021 1416 03/11/2021 1915 03/17/21 0312 03/18/21 0318 03/19/21 0315  NA 145  --  141 145 142  K 3.2*  --  4.1 3.1* 3.5  CL 110  --  112* 114* 111  CO2 26  --  25 22 25   GLUCOSE 115*  --  87 82 99  BUN 15  --  14 12 14   CREATININE 0.74  --  0.58 0.54 0.62  CALCIUM 8.7*  --  7.7* 8.2* 8.1*  MG  --  1.7 2.3 2.0 2.2     GFR: Estimated Creatinine Clearance: 39.6 mL/min (by C-G formula based on SCr of 0.62 mg/dL).  Liver Function Tests: Recent Labs  Lab 03/11/2021 1416  AST 36  ALT 21  ALKPHOS 48  BILITOT 1.2  PROT 6.2*  ALBUMIN 2.9*     CBG: No results for input(s): GLUCAP in the last 168 hours.   Recent Results (from the past 240 hour(s))  Blood culture (routine single)     Status: None (Preliminary result)   Collection Time: 02/15/2021  2:17 PM   Specimen: BLOOD  Result Value Ref Range Status   Specimen Description   Final    BLOOD SITE NOT SPECIFIED Performed at [facility], [address], [place], [place] [address]    Special Requests   Final    BOTTLES DRAWN AEROBIC AND ANAEROBIC Blood Culture results may not be optimal due to an excessive volume of blood received in culture bottles Performed at [facility] [address], [place], [place] [address]    Culture   Final    NO GROWTH 3 DAYS Performed at [facility], [place] [address] [place], [place] [address]    Report Status PENDING  Incomplete  Resp Panel by RT-PCR (Flu A&B, Covid) Nasopharyngeal Swab     Status: None   Collection Time: 03/05/2021  5:36 PM   Specimen: Nasopharyngeal Swab; Nasopharyngeal(NP) swabs in vial transport medium  Result Value Ref Range Status   SARS Coronavirus 2 by RT PCR NEGATIVE NEGATIVE Final    Comment: (NOTE) SARS-CoV-2 target nucleic acids are NOT DETECTED.  The SARS-CoV-2 RNA is generally  detectable in upper respiratory specimens during the acute phase of infection. The lowest concentration of SARS-CoV-2 viral copies this assay can detect is 138 copies/mL. A negative result does not preclude SARS-Cov-2 infection and should not be used as the sole basis for treatment or other patient management decisions. A negative result may occur with  improper specimen collection/handling, submission of specimen other than nasopharyngeal swab, presence of viral mutation(s) within the areas targeted by this assay, and inadequate number of viral copies(<138 copies/mL). A negative result must be combined with clinical observations, patient history, and epidemiological information. The expected result is Negative.  Fact Sheet for Patients:  EntrepreneurPulse.com.au  Fact Sheet for Healthcare Providers:  IncredibleEmployment.be  This test is no t yet approved or cleared by the [place] FDA and  has been authorized for detection and/or diagnosis of SARS-CoV-2 by FDA under an Emergency Use Authorization (EUA). This EUA will remain  in effect (meaning this test can be used) for the duration of the COVID-19 declaration under Section 564(b)(1) of the Act, 21 U.S.C.section 360bbb-3(b)(1), unless the authorization is terminated  or revoked sooner.       Influenza A by PCR NEGATIVE NEGATIVE Final   Influenza B by PCR NEGATIVE NEGATIVE Final    Comment: (NOTE) The Xpert Xpress SARS-CoV-2/FLU/RSV plus assay is intended as an aid in the diagnosis of influenza from Nasopharyngeal swab specimens and should not be used as a sole basis for treatment. Nasal washings and aspirates are unacceptable for Xpert Xpress SARS-CoV-2/FLU/RSV testing.  Fact Sheet for Patients: EntrepreneurPulse.com.au  Fact Sheet for Healthcare Providers: IncredibleEmployment.be  This test is not yet approved or cleared by the [place] FDA  and has been authorized for detection and/or diagnosis of SARS-CoV-2 by FDA under an Emergency Use Authorization (EUA). This EUA will remain in effect (meaning this test can be used) for the duration of the COVID-19 declaration under Section 564(b)(1) of the Act, 21 U.S.C. section 360bbb-3(b)(1), unless the authorization is terminated or revoked.  Performed at [facility], [place] [address] [place], [place] [address]   Culture, blood (routine x 2) Call MD if unable to obtain prior to antibiotics being given     Status: None (Preliminary result)   Collection Time: 03/11/2021  7:14 PM   Specimen: BLOOD  Result Value Ref Range Status   Specimen Description   Final    BLOOD RIGHT ANTECUBITAL Performed at [facility] [place] [address] [place], [place] [address]    Special Requests   Final    BOTTLES DRAWN AEROBIC AND ANAEROBIC Blood Culture adequate volume Performed at [place] [address] [place], [place] [address]    Culture   Final    NO GROWTH 3 DAYS Performed at [facility], [place] [address], [place], [place] [address]    Report Status PENDING  Incomplete  Culture, blood (routine x 2) Call MD if unable to obtain prior to antibiotics being given     Status: None (Preliminary result)   Collection Time: 03/13/2021  7:16 PM   Specimen: BLOOD LEFT HAND  Result Value Ref Range Status   Specimen Description   Final    BLOOD LEFT HAND Performed at [place] [address] [place], [place] [address]    Special Requests   Final    BOTTLES DRAWN AEROBIC AND ANAEROBIC Blood Culture adequate volume Performed at [place] [address], [place], [place] [address]    Culture   Final    NO GROWTH 3 DAYS Performed at [facility], [place] [address] [place], [place] [address]    Report Status PENDING  Incomplete  MRSA Next Gen by PCR, Nasal     Status: None   Collection  Time: 03/08/2021  8:53 PM  Specimen: Nasal Mucosa; Nasal Swab  Result Value Ref Range Status   MRSA by PCR Next Gen NOT DETECTED NOT DETECTED Final    Comment: (NOTE) The GeneXpert MRSA Assay (FDA approved for NASAL specimens only), is one component of a comprehensive MRSA colonization surveillance program. It is not intended to diagnose MRSA infection nor to guide or monitor treatment for MRSA infections. Test performance is not FDA approved in patients less than 82 years old. Performed at [facility], [place] [address] [place], [place] [address]   Urine Culture     Status: Abnormal   Collection Time: 03/17/21  3:01 AM   Specimen: Urine, Catheterized  Result Value Ref Range Status   Specimen Description   Final    URINE, CATHETERIZED Performed at [place] [address] [place], [place] [address]    Special Requests   Final    NONE Performed at [facility], [place] [address] [place], [place] [address]    Culture (A)  Final    <10,000 COLONIES/mL INSIGNIFICANT GROWTH Performed at [place] [address], [place], [place] [address]    Report Status 03/18/2021 FINAL  Final          Radiology Studies: ECHOCARDIOGRAM COMPLETE  Result Date: 03/17/2021    ECHOCARDIOGRAM REPORT   Patient Name:   Leslie Petersen Date of Exam: 03/17/2021 Medical Rec #:  [id number]    Height:       64.0 in Accession #:    [id number]   Weight:       106.9 lb Date of Birth:  [birth year]   BSA:          1.499 m Patient Age:    70 years     BP:           133/30 mmHg Patient Gender: F            HR:           78 bpm. Exam Location:  Inpatient Procedure: 2D Echo, Color Doppler and Cardiac Doppler Indications:    I48.91* Unspecified atrial fibrillation  History:        Patient has no prior history of Echocardiogram examinations.                 Lung Cancer.  Sonographer:    Raquel Sarna Senior RDCS Referring Phys: [place]  Sonographer  Comments: Technically difficult due to small rib spacing. IMPRESSIONS  1. Left ventricular ejection fraction, by estimation, is 65 to 70%. The left ventricle has normal function. The left ventricle has no regional wall motion abnormalities. There is moderate left ventricular hypertrophy of the basal-septal segment. Left ventricular diastolic parameters are consistent with Grade I diastolic dysfunction (impaired relaxation). Elevated left ventricular end-diastolic pressure.  2. Right ventricular systolic function is normal. The right ventricular size is normal. There is normal pulmonary artery systolic pressure.  3. The mitral valve is degenerative. Mild mitral valve regurgitation. No evidence of mitral stenosis.  4. The aortic valve is normal in structure. Aortic valve regurgitation is mild. No aortic stenosis is present.  5. The inferior vena cava is normal in size with greater than 50% respiratory variability, suggesting right atrial pressure of 3 mmHg. FINDINGS  Left Ventricle: Left ventricular ejection fraction, by estimation, is 65 to 70%. The left ventricle has normal function. The left ventricle has no regional wall motion abnormalities. The left ventricular internal cavity size was normal in size. There is  moderate left ventricular hypertrophy of the basal-septal segment. Left ventricular diastolic parameters are consistent with Grade I diastolic dysfunction (impaired relaxation). Elevated left ventricular end-diastolic pressure. Right Ventricle: The right ventricular size is normal. No increase in right ventricular wall thickness. Right ventricular systolic function is normal. There is normal pulmonary artery systolic pressure. The tricuspid regurgitant velocity is 2.71 m/s, and  with an assumed right atrial pressure of 3 mmHg, the estimated right ventricular systolic pressure is 13.2 mmHg. Left Atrium: Left atrial size was normal in size. Right Atrium: Right atrial size was normal in size. Pericardium:  There is no evidence of pericardial effusion. Mitral Valve: The mitral valve is degenerative in appearance. There is mild thickening of the mitral valve leaflet(s). There is mild calcification of the mitral valve leaflet(s). Mild mitral annular calcification. Mild mitral valve regurgitation. No evidence of mitral valve stenosis. MV peak gradient, 7.3 mmHg. The mean mitral valve gradient is 4.0 mmHg. Tricuspid Valve: The tricuspid valve is normal in structure. Tricuspid valve regurgitation is mild . No evidence of tricuspid stenosis. Aortic Valve: The aortic valve is normal in structure. Aortic valve regurgitation is mild. No aortic stenosis is present. Pulmonic Valve: The pulmonic valve was normal in structure. Pulmonic valve regurgitation is not visualized. No evidence of pulmonic stenosis. Aorta: The aortic root is normal in size and structure. Venous: The inferior vena cava is normal in size with greater than 50% respiratory variability, suggesting right atrial pressure of 3 mmHg. IAS/Shunts: No atrial level shunt detected by color flow Doppler.  LEFT VENTRICLE PLAX 2D LVIDd:         2.30 cm  Diastology LVIDs:         1.50 cm  LV e' medial:    4.68 cm/s LV PW:         1.10 cm  LV E/e' medial:  26.5 LV IVS:        1.40 cm  LV e' lateral:   5.98 cm/s LVOT diam:     1.80 cm  LV E/e' lateral: 20.7 LV SV:         79 LV SV Index:   52 LVOT Area:     2.54 cm  RIGHT VENTRICLE RV S prime:     17.70 cm/s TAPSE (M-mode): 2.3 cm LEFT ATRIUM             Index       RIGHT ATRIUM           Index LA diam:        2.50 cm 1.67 cm/m  RA Area:     11.80 cm LA Vol (A2C):   62.5 ml 41.69 ml/m RA Volume:   23.00 ml  15.34 ml/m LA Vol (A4C):   34.0 ml 22.68 ml/m LA Biplane Vol: 49.6 ml 33.08 ml/m  AORTIC VALVE LVOT Vmax:   138.00 cm/s LVOT Vmean:  96.800 cm/s LVOT VTI:    0.309 m  AORTA Ao Root diam: 3.20 cm Ao Asc diam:  3.40 cm MITRAL VALVE                TRICUSPID VALVE MV Area (PHT): 2.44 cm     TR Peak grad:   29.4 mmHg MV  Area VTI:   2.28 cm     TR Vmax:        271.00 cm/s MV Peak grad:  7.3 mmHg MV Mean grad:  4.0 mmHg     SHUNTS MV Vmax:       1.35  m/s     Systemic VTI:  0.31 m MV Vmean:      95.4 cm/s    Systemic Diam: 1.80 cm MV Decel Time: 311 msec MV E velocity: 124.00 cm/s MV A velocity: 139.00 cm/s MV E/A ratio:  0.89 Fransico Him MD Electronically signed by Fransico Him MD Signature Date/Time: 03/17/2021/1:06:00 PM    Final         Scheduled Meds:  acetaminophen  500 mg Oral TID   amoxicillin-clavulanate  1 tablet Oral Q12H   aspirin EC  81 mg Oral Daily   Chlorhexidine Gluconate Cloth  6 each Topical Daily   enoxaparin (LOVENOX) injection  30 mg Subcutaneous Q24H   feeding supplement  237 mL Oral TID BM   fludrocortisone  0.1 mg Oral Q24H   gabapentin  300 mg Oral QHS   guaiFENesin  1,200 mg Oral BID   hydrocortisone  10 mg Oral Q24H   hydrocortisone  5 mg Oral Q24H   latanoprost  1 drop Both Eyes QHS   metoprolol succinate  12.5 mg Oral Daily   multivitamin with minerals  1 tablet Oral Daily   pantoprazole  40 mg Oral Daily   phenazopyridine  100 mg Oral TID WC   sodium chloride flush  3 mL Intravenous Q12H   Continuous Infusions:  sodium chloride Stopped (03/18/21 1449)     LOS: 3 days    Time spent: 40 minutes    Irine Seal, MD [facility]   To contact the attending provider between 7A-7P or the covering provider during after hours 7P-7A, please log into the web site [email or web address] and access using universal [place] password for that web site. If you do not have the password, please call the hospital operator.  03/19/2021, 9:28 AM

## 2021-03-19 NOTE — Progress Notes (Signed)
DAILY PROGRESS NOTE   Patient Name: Leslie Petersen Date of Encounter: 03/19/2021 Cardiologist: Minus Breeding, MD  Chief Complaint   No complaints  Patient Profile   Leslie Petersen is a 86 y.o. female without a prior cardiac history who is being seen today for follow up of atrial fib   Subjective   Echo yesterday shows hyperdynamic LVEF 65-70%, no regional WMA's, mild AI and mild MR. Hypokalemic today at 3.1. Cholesterol is low at LDL 39.  Objective   Vitals:   03/19/21 1200 03/19/21 1300 03/19/21 1400 03/19/21 1449  BP: (!) 157/132 (!) 138/52 (!) 152/49 (!) 154/79  Pulse: (!) 104 80 89 88  Resp: (!) 28 (!) 25 20 20   Temp:    98.9 F (37.2 C)  TempSrc:    Oral  SpO2: 90% 90% 91% 94%  Weight:      Height:        Intake/Output Summary (Last 24 hours) at 03/19/2021 1713 Last data filed at 03/19/2021 1300 Gross per 24 hour  Intake 564.3 ml  Output 140 ml  Net 424.3 ml   Filed Weights   03/15/2021 1800 03/18/21 0500 03/19/21 0500  Weight: 48.5 kg 55.1 kg 47.1 kg    Physical Exam   General appearance: alert, appears stated age, and no distress Neck: no carotid bruit, no JVD, and thyroid not enlarged, symmetric, no tenderness/mass/nodules Lungs: diminished breath sounds bilaterally Heart: regular rate and rhythm, S1, S2 normal, no murmur, click, rub or gallop Abdomen: soft, non-tender; bowel sounds normal; no masses,  no organomegaly Extremities: extremities normal, atraumatic, no cyanosis or edema Pulses: 2+ and symmetric Skin: pale, warm, dry Neurologic: Alert and oriented X 3, normal strength and tone. Normal symmetric reflexes. Normal coordination and gait Psych: Pleasant  Inpatient Medications    Scheduled Meds:  acetaminophen  500 mg Oral TID   amoxicillin-clavulanate  1 tablet Oral Q12H   aspirin EC  81 mg Oral Daily   Chlorhexidine Gluconate Cloth  6 each Topical Daily   enoxaparin (LOVENOX) injection  30 mg Subcutaneous Q24H   feeding supplement  237 mL  Oral TID BM   fludrocortisone  0.1 mg Oral Q24H   gabapentin  300 mg Oral QHS   guaiFENesin  1,200 mg Oral BID   hydrocortisone  10 mg Oral Q24H   hydrocortisone  5 mg Oral Q24H   latanoprost  1 drop Both Eyes QHS   metoprolol succinate  12.5 mg Oral Daily   multivitamin with minerals  1 tablet Oral Daily   pantoprazole  40 mg Oral Daily   phenazopyridine  100 mg Oral TID WC   sodium chloride flush  3 mL Intravenous Q12H    Continuous Infusions:    PRN Meds: acetaminophen **OR** acetaminophen, gabapentin, guaiFENesin, ipratropium, ketorolac, levalbuterol, loperamide, polyethylene glycol, prochlorperazine, QUEtiapine, sorbitol   Labs   Results for orders placed or performed during the hospital encounter of 02/19/2021 (from the past 48 hour(s))  Lipid panel     Status: Abnormal   Collection Time: 03/18/21  3:18 AM  Result Value Ref Range   Cholesterol 83 0 - 200 mg/dL   Triglycerides 56 <150 mg/dL   HDL 33 (L) >40 mg/dL   Total CHOL/HDL Ratio 2.5 RATIO   VLDL 11 0 - 40 mg/dL   LDL Cholesterol 39 0 - 99 mg/dL    Comment:        Total Cholesterol/HDL:CHD Risk Coronary Heart Disease Risk Table  Men   Women  1/2 Average Risk   3.4   3.3  Average Risk       5.0   4.4  2 X Average Risk   9.6   7.1  3 X Average Risk  23.4   11.0        Use the calculated Patient Ratio above and the CHD Risk Table to determine the patient's CHD Risk.        ATP III CLASSIFICATION (LDL):  <100     mg/dL   Optimal  100-129  mg/dL   Near or Above                    Optimal  130-159  mg/dL   Borderline  160-189  mg/dL   High  >190     mg/dL   Very High Performed at [facility] [address], [place], [place] [address]   CBC with Differential/Platelet     Status: Abnormal   Collection Time: 03/18/21  3:18 AM  Result Value Ref Range   WBC 6.1 4.0 - 10.5 K/uL   RBC 2.70 (L) 3.87 - 5.11 MIL/uL   Hemoglobin 10.0 (L) 12.0 - 15.0 g/dL   HCT 29.5 (L) 36.0  - 46.0 %   MCV 109.3 (H) 80.0 - 100.0 fL   MCH 37.0 (H) 26.0 - 34.0 pg   MCHC 33.9 30.0 - 36.0 g/dL   RDW 13.3 11.5 - 15.5 %   Platelets 119 (L) 150 - 400 K/uL    Comment: Immature Platelet Fraction may be clinically indicated, consider ordering this additional test [id number] CONSISTENT WITH PREVIOUS RESULT    nRBC 0.5 (H) 0.0 - 0.2 %   Neutrophils Relative % 65 %   Neutro Abs 4.0 1.7 - 7.7 K/uL   Lymphocytes Relative 22 %   Lymphs Abs 1.3 0.7 - 4.0 K/uL   Monocytes Relative 9 %   Monocytes Absolute 0.5 0.1 - 1.0 K/uL   Eosinophils Relative 3 %   Eosinophils Absolute 0.2 0.0 - 0.5 K/uL   Basophils Relative 1 %   Basophils Absolute 0.0 0.0 - 0.1 K/uL   Immature Granulocytes 0 %   Abs Immature Granulocytes 0.02 0.00 - 0.07 K/uL    Comment: Performed at [facility], [place] [address] [place], [place] [address]  Basic metabolic panel     Status: Abnormal   Collection Time: 03/18/21  3:18 AM  Result Value Ref Range   Sodium 145 135 - 145 mmol/L   Potassium 3.1 (L) 3.5 - 5.1 mmol/L    Comment: DELTA CHECK NOTED   Chloride 114 (H) 98 - 111 mmol/L   CO2 22 22 - 32 mmol/L   Glucose, Bld 82 70 - 99 mg/dL    Comment: Glucose reference range applies only to samples taken after fasting for at least 8 hours.   BUN 12 8 - 23 mg/dL   Creatinine, Ser 0.54 0.44 - 1.00 mg/dL   Calcium 8.2 (L) 8.9 - 10.3 mg/dL   GFR, Estimated >60 >60 mL/min    Comment: (NOTE) Calculated using the CKD-EPI Creatinine Equation (2021)    Anion gap 9 5 - 15    Comment: Performed at [facility], [place] [address], [place], [place] [address]  Magnesium     Status: None   Collection Time: 03/18/21  3:18 AM  Result Value Ref Range   Magnesium 2.0 1.7 - 2.4 mg/dL    Comment: Performed  at [facility], [place] [address] [place], [place] [address]  CBC     Status: Abnormal   Collection Time: 03/19/21  3:15 AM  Result Value Ref Range   WBC 6.4 4.0 - 10.5 K/uL    RBC 2.53 (L) 3.87 - 5.11 MIL/uL   Hemoglobin 9.2 (L) 12.0 - 15.0 g/dL   HCT 27.8 (L) 36.0 - 46.0 %   MCV 109.9 (H) 80.0 - 100.0 fL   MCH 36.4 (H) 26.0 - 34.0 pg   MCHC 33.1 30.0 - 36.0 g/dL   RDW 13.6 11.5 - 15.5 %   Platelets 123 (L) 150 - 400 K/uL    Comment: Immature Platelet Fraction may be clinically indicated, consider ordering this additional test [id number]    nRBC 0.3 (H) 0.0 - 0.2 %    Comment: Performed at [facility], [place] [address], [place], [place] [address]  Basic metabolic panel     Status: Abnormal   Collection Time: 03/19/21  3:15 AM  Result Value Ref Range   Sodium 142 135 - 145 mmol/L   Potassium 3.5 3.5 - 5.1 mmol/L   Chloride 111 98 - 111 mmol/L   CO2 25 22 - 32 mmol/L   Glucose, Bld 99 70 - 99 mg/dL    Comment: Glucose reference range applies only to samples taken after fasting for at least 8 hours.   BUN 14 8 - 23 mg/dL   Creatinine, Ser 0.62 0.44 - 1.00 mg/dL   Calcium 8.1 (L) 8.9 - 10.3 mg/dL   GFR, Estimated >60 >60 mL/min    Comment: (NOTE) Calculated using the CKD-EPI Creatinine Equation (2021)    Anion gap 6 5 - 15    Comment: Performed at [facility], [place] [address] [place], [place] [address]  Magnesium     Status: None   Collection Time: 03/19/21  3:15 AM  Result Value Ref Range   Magnesium 2.2 1.7 - 2.4 mg/dL    Comment: Performed at [facility], [place] [address] [place],  [address]    ECG   N/A  Telemetry   Sinus rhythm - Personally Reviewed  Radiology    No results found.  Cardiac Studies   Echo  as above  Assessment   Principal Problem:   CAP (community acquired pneumonia) Active Problems:   Osteoporosis   Hypotension   Adrenocortical carcinoma ([place])   New onset atrial fibrillation (HCC)   Chronic back pain   Generalized weakness   Dehydration   Hypokalemia   Anemia   Adnexal cyst   Non-ST elevation (NSTEMI) myocardial infarction (HCC)   Acute  lower UTI   Plan   PAF -maintaining sinus - LVEF hyperdynamic on echo with normal atrial sizes - would continue - BB, may be able to uptitrate some.  NSTEMI - significant troponin elevation which appears to be downtrending - given 3 vessel atherosclerosis, suspect she had out of hospital MI, unlikely related to sepsis - no S/S of CHF - but silent as far as I can tell. Would continue aspirin 81 mg daily and Toprol XL 12.5 mg daily. Will stop atorvastatin as LDL 39 - overall cholesterol is quite low. As she remains chest pain free - would advocate medical management. 3.  CAP - upper lobe, ?Aspiration - Abx per hospital medicine, swallow eval yesterday  Time Spent Directly with Patient:  I have spent a total of 25 minutes with the patient reviewing hospital notes, telemetry, EKGs, labs  and examining the patient as well as establishing an assessment and plan that was discussed personally with the patient.  > 50% of time was spent in direct patient care.  Length of Stay:  LOS: 3 days   Pixie Casino, MD, [facility], [place] Director of the Advanced Lipid Disorders &  Cardiovascular Risk Reduction Clinic Diplomate of the American Board of Clinical Lipidology Attending Cardiologist  Direct Dial: [phone]  Fax: [phone]  Website:  [email or web address]  Dorris Carnes 03/19/2021, 5:13 PM

## 2021-03-20 ENCOUNTER — Encounter (HOSPITAL_COMMUNITY): Admission: EM | Disposition: E | Payer: Self-pay | Source: Home / Self Care | Attending: Internal Medicine

## 2021-03-20 ENCOUNTER — Inpatient Hospital Stay (HOSPITAL_COMMUNITY): Payer: Medicare Other

## 2021-03-20 ENCOUNTER — Encounter (HOSPITAL_COMMUNITY): Payer: Self-pay | Admitting: Neurology

## 2021-03-20 ENCOUNTER — Emergency Department (HOSPITAL_COMMUNITY): Payer: Medicare Other | Admitting: Anesthesiology

## 2021-03-20 ENCOUNTER — Other Ambulatory Visit: Payer: Self-pay

## 2021-03-20 ENCOUNTER — Ambulatory Visit (HOSPITAL_COMMUNITY): Payer: Medicare Other

## 2021-03-20 ENCOUNTER — Encounter (HOSPITAL_COMMUNITY): Payer: Medicare Other

## 2021-03-20 DIAGNOSIS — I639 Cerebral infarction, unspecified: Secondary | ICD-10-CM

## 2021-03-20 DIAGNOSIS — I6601 Occlusion and stenosis of right middle cerebral artery: Secondary | ICD-10-CM | POA: Diagnosis present

## 2021-03-20 DIAGNOSIS — I4891 Unspecified atrial fibrillation: Secondary | ICD-10-CM | POA: Diagnosis not present

## 2021-03-20 DIAGNOSIS — I480 Paroxysmal atrial fibrillation: Secondary | ICD-10-CM | POA: Diagnosis not present

## 2021-03-20 DIAGNOSIS — I63511 Cerebral infarction due to unspecified occlusion or stenosis of right middle cerebral artery: Secondary | ICD-10-CM | POA: Insufficient documentation

## 2021-03-20 DIAGNOSIS — C7400 Malignant neoplasm of cortex of unspecified adrenal gland: Secondary | ICD-10-CM | POA: Diagnosis not present

## 2021-03-20 DIAGNOSIS — J189 Pneumonia, unspecified organism: Principal | ICD-10-CM

## 2021-03-20 DIAGNOSIS — N390 Urinary tract infection, site not specified: Secondary | ICD-10-CM | POA: Diagnosis not present

## 2021-03-20 DIAGNOSIS — N949 Unspecified condition associated with female genital organs and menstrual cycle: Secondary | ICD-10-CM | POA: Diagnosis not present

## 2021-03-20 HISTORY — PX: RADIOLOGY WITH ANESTHESIA: SHX6223

## 2021-03-20 HISTORY — PX: IR CT HEAD LTD: IMG2386

## 2021-03-20 HISTORY — PX: IR PERCUTANEOUS ART THROMBECTOMY/INFUSION INTRACRANIAL INC DIAG ANGIO: IMG6087

## 2021-03-20 LAB — LIPID PANEL
Cholesterol: 78 mg/dL (ref 0–200)
HDL: 37 mg/dL — ABNORMAL LOW (ref >40)
LDL Cholesterol: 30 mg/dL (ref 0–99)
Total CHOL/HDL Ratio: 2.1 RATIO
Triglycerides: 55 mg/dL (ref <150)
VLDL: 11 mg/dL (ref 0–40)

## 2021-03-20 LAB — CBC WITH DIFFERENTIAL/PLATELET
Abs Immature Granulocytes: 0.05 10*3/uL (ref 0.00–0.07)
Basophils Absolute: 0.1 10*3/uL (ref 0.0–0.1)
Basophils Relative: 1 %
Eosinophils Absolute: 0.3 10*3/uL (ref 0.0–0.5)
Eosinophils Relative: 3 %
HCT: 29.2 % — ABNORMAL LOW (ref 36.0–46.0)
Hemoglobin: 10.1 g/dL — ABNORMAL LOW (ref 12.0–15.0)
Immature Granulocytes: 1 %
Lymphocytes Relative: 17 %
Lymphs Abs: 1.5 10*3/uL (ref 0.7–4.0)
MCH: 36.9 pg — ABNORMAL HIGH (ref 26.0–34.0)
MCHC: 34.6 g/dL (ref 30.0–36.0)
MCV: 106.6 fL — ABNORMAL HIGH (ref 80.0–100.0)
Monocytes Absolute: 0.7 10*3/uL (ref 0.1–1.0)
Monocytes Relative: 8 %
Neutro Abs: 6.5 10*3/uL (ref 1.7–7.7)
Neutrophils Relative %: 70 %
Platelets: 130 10*3/uL — ABNORMAL LOW (ref 150–400)
RBC: 2.74 MIL/uL — ABNORMAL LOW (ref 3.87–5.11)
RDW: 13.2 % (ref 11.5–15.5)
WBC: 9.1 10*3/uL (ref 4.0–10.5)
nRBC: 0.6 % — ABNORMAL HIGH (ref 0.0–0.2)

## 2021-03-20 LAB — HEMOGLOBIN A1C
Hgb A1c MFr Bld: 5.1 % (ref 4.8–5.6)
Mean Plasma Glucose: 99.67 mg/dL

## 2021-03-20 LAB — LEGIONELLA PNEUMOPHILA SEROGP 1 UR AG: L. pneumophila Serogp 1 Ur Ag: NEGATIVE

## 2021-03-20 LAB — BASIC METABOLIC PANEL
Anion gap: 5 (ref 5–15)
BUN: 11 mg/dL (ref 8–23)
CO2: 26 mmol/L (ref 22–32)
Calcium: 8.2 mg/dL — ABNORMAL LOW (ref 8.9–10.3)
Chloride: 110 mmol/L (ref 98–111)
Creatinine, Ser: 0.5 mg/dL (ref 0.44–1.00)
GFR, Estimated: >60 mL/min (ref >60)
Glucose, Bld: 81 mg/dL (ref 70–99)
Potassium: 3.2 mmol/L — ABNORMAL LOW (ref 3.5–5.1)
Sodium: 141 mmol/L (ref 135–145)

## 2021-03-20 LAB — GLUCOSE, CAPILLARY
Glucose-Capillary: 75 mg/dL (ref 70–99)
Glucose-Capillary: 92 mg/dL (ref 70–99)
Glucose-Capillary: 74 mg/dL (ref 70–99)
Glucose-Capillary: 112 mg/dL — ABNORMAL HIGH (ref 70–99)
Glucose-Capillary: 113 mg/dL — ABNORMAL HIGH (ref 70–99)

## 2021-03-20 LAB — MRSA NEXT GEN BY PCR, NASAL: MRSA by PCR Next Gen: NOT DETECTED

## 2021-03-20 LAB — CORTISOL-AM, BLOOD: Cortisol - AM: 7.6 ug/dL (ref 6.7–22.6)

## 2021-03-20 LAB — CBG MONITORING, ED: Glucose-Capillary: 62 mg/dL — ABNORMAL LOW (ref 70–99)

## 2021-03-20 SURGERY — IR WITH ANESTHESIA
Anesthesia: General

## 2021-03-20 MED ORDER — STROKE: EARLY STAGES OF RECOVERY BOOK
Freq: Once | Status: DC
  Filled 2021-03-20: qty 1

## 2021-03-20 MED ORDER — ASPIRIN 81 MG PO CHEW
CHEWABLE_TABLET | ORAL | Status: AC
  Filled 2021-03-20: qty 1

## 2021-03-20 MED ORDER — CEFAZOLIN SODIUM-DEXTROSE 2-4 GM/100ML-% IV SOLN
INTRAVENOUS | Status: AC
  Filled 2021-03-20: qty 100

## 2021-03-20 MED ORDER — IOHEXOL 350 MG/ML SOLN
100.0000 mL | Freq: Once | INTRAVENOUS | Status: AC | PRN
  Administered 2021-03-20: 90 mL via INTRAVENOUS

## 2021-03-20 MED ORDER — SODIUM CHLORIDE 0.9 % IV SOLN
3.0000 g | Freq: Four times a day (QID) | INTRAVENOUS | Status: DC
  Administered 2021-03-20 – 2021-03-21 (×4): 3 g via INTRAVENOUS
  Filled 2021-03-20 (×3): qty 8

## 2021-03-20 MED ORDER — CLEVIDIPINE BUTYRATE 0.5 MG/ML IV EMUL: 0.0000 mg/h | INTRAVENOUS | Status: DC

## 2021-03-20 MED ORDER — NITROGLYCERIN 1 MG/10 ML FOR IR/CATH LAB
INTRA_ARTERIAL | Status: AC
  Filled 2021-03-20: qty 10

## 2021-03-20 MED ORDER — PHENYLEPHRINE HCL-NACL 20-0.9 MG/250ML-% IV SOLN
25.0000 ug/min | INTRAVENOUS | Status: DC
Start: 2021-03-20 — End: 2021-03-21
  Administered 2021-03-20 (×3): 200 ug/min via INTRAVENOUS
  Administered 2021-03-21: 25 ug/min via INTRAVENOUS
  Filled 2021-03-20: qty 250
  Filled 2021-03-20 (×2): qty 500

## 2021-03-20 MED ORDER — ACETAMINOPHEN 650 MG RE SUPP: 650.0000 mg | RECTAL | Status: DC | PRN

## 2021-03-20 MED ORDER — QUETIAPINE FUMARATE 25 MG PO TABS: 25.0000 mg | ORAL_TABLET | Freq: Every evening | ORAL | Status: DC | PRN

## 2021-03-20 MED ORDER — FENTANYL CITRATE PF 50 MCG/ML IJ SOSY
50.0000 ug | PREFILLED_SYRINGE | INTRAMUSCULAR | Status: DC
  Administered 2021-03-20 – 2021-03-21 (×3): 50 ug via INTRAVENOUS
  Filled 2021-03-20 (×3): qty 1

## 2021-03-20 MED ORDER — SODIUM CHLORIDE 0.9 % IV SOLN: INTRAVENOUS | Status: DC | PRN

## 2021-03-20 MED ORDER — PHENYLEPHRINE HCL-NACL 20-0.9 MG/250ML-% IV SOLN
INTRAVENOUS | Status: DC | PRN
  Administered 2021-03-20: 25 ug/min via INTRAVENOUS

## 2021-03-20 MED ORDER — VERAPAMIL HCL 2.5 MG/ML IV SOLN
INTRAVENOUS | Status: AC
  Filled 2021-03-20: qty 2

## 2021-03-20 MED ORDER — ORAL CARE MOUTH RINSE
15.0000 mL | OROMUCOSAL | Status: DC
  Administered 2021-03-20 – 2021-03-21 (×8): 15 mL via OROMUCOSAL

## 2021-03-20 MED ORDER — ACETAMINOPHEN 160 MG/5ML PO SOLN: 650.0000 mg | ORAL | Status: DC | PRN

## 2021-03-20 MED ORDER — PHENYLEPHRINE HCL-NACL 20-0.9 MG/250ML-% IV SOLN: 0.0000 ug/min | INTRAVENOUS | Status: DC

## 2021-03-20 MED ORDER — IOHEXOL 350 MG/ML SOLN
100.0000 mL | Freq: Once | INTRAVENOUS | Status: AC | PRN
  Administered 2021-03-20: 50 mL via INTRA_ARTERIAL

## 2021-03-20 MED ORDER — PHENYLEPHRINE 40 MCG/ML (10ML) SYRINGE FOR IV PUSH (FOR BLOOD PRESSURE SUPPORT)
PREFILLED_SYRINGE | INTRAVENOUS | Status: DC | PRN
Start: 2021-03-20 — End: 2021-03-20
  Administered 2021-03-20 (×4): 80 ug via INTRAVENOUS

## 2021-03-20 MED ORDER — LIDOCAINE 2% (20 MG/ML) 5 ML SYRINGE
INTRAMUSCULAR | Status: DC | PRN
  Administered 2021-03-20: 40 mg via INTRAVENOUS

## 2021-03-20 MED ORDER — ACETAMINOPHEN 325 MG PO TABS: 650.0000 mg | ORAL_TABLET | ORAL | Status: DC | PRN

## 2021-03-20 MED ORDER — SODIUM CHLORIDE 0.9 % IV SOLN: 250.0000 mL | INTRAVENOUS | Status: DC

## 2021-03-20 MED ORDER — CLOPIDOGREL BISULFATE 300 MG PO TABS
ORAL_TABLET | ORAL | Status: AC
  Filled 2021-03-20: qty 1

## 2021-03-20 MED ORDER — NOREPINEPHRINE 4 MG/250ML-% IV SOLN
0.0000 ug/min | INTRAVENOUS | Status: DC
Start: 2021-03-20 — End: 2021-03-21
  Administered 2021-03-20: 2 ug/min via INTRAVENOUS
  Administered 2021-03-21: 11 ug/min via INTRAVENOUS
  Filled 2021-03-20 (×2): qty 250

## 2021-03-20 MED ORDER — CEFAZOLIN SODIUM-DEXTROSE 2-3 GM-%(50ML) IV SOLR
INTRAVENOUS | Status: DC | PRN
  Administered 2021-03-20: 2 g via INTRAVENOUS

## 2021-03-20 MED ORDER — FENTANYL CITRATE (PF) 250 MCG/5ML IJ SOLN
INTRAMUSCULAR | Status: DC | PRN
  Administered 2021-03-20: 25 ug via INTRAVENOUS
  Administered 2021-03-20: 50 ug via INTRAVENOUS

## 2021-03-20 MED ORDER — EPHEDRINE SULFATE-NACL 50-0.9 MG/10ML-% IV SOSY
PREFILLED_SYRINGE | INTRAVENOUS | Status: DC | PRN
  Administered 2021-03-20 (×2): 5 mg via INTRAVENOUS

## 2021-03-20 MED ORDER — IOHEXOL 350 MG/ML SOLN
100.0000 mL | Freq: Once | INTRAVENOUS | Status: AC | PRN
  Administered 2021-03-20: 30 mL via INTRA_ARTERIAL

## 2021-03-20 MED ORDER — GABAPENTIN 300 MG PO CAPS: 300.0000 mg | ORAL_CAPSULE | Freq: Every day | ORAL | Status: DC

## 2021-03-20 MED ORDER — DEXMEDETOMIDINE HCL IN NACL 400 MCG/100ML IV SOLN
0.4000 ug/kg/h | INTRAVENOUS | Status: DC
  Administered 2021-03-20: 0.4 ug/kg/h via INTRAVENOUS
  Filled 2021-03-20: qty 100

## 2021-03-20 MED ORDER — PROPOFOL 500 MG/50ML IV EMUL
INTRAVENOUS | Status: DC | PRN
  Administered 2021-03-20: 25 ug/kg/min via INTRAVENOUS

## 2021-03-20 MED ORDER — DEXTROSE 50 % IV SOLN
INTRAVENOUS | Status: AC
  Filled 2021-03-20: qty 50

## 2021-03-20 MED ORDER — POTASSIUM CHLORIDE 10 MEQ/100ML IV SOLN
10.0000 meq | INTRAVENOUS | Status: AC
Start: 2021-03-20 — End: 2021-03-20

## 2021-03-20 MED ORDER — SODIUM CHLORIDE 0.9 % IV SOLN: INTRAVENOUS | Status: DC

## 2021-03-20 MED ORDER — CANGRELOR TETRASODIUM 50 MG IV SOLR
INTRAVENOUS | Status: AC
  Filled 2021-03-20: qty 50

## 2021-03-20 MED ORDER — LACTATED RINGERS IV BOLUS
1000.0000 mL | Freq: Once | INTRAVENOUS | Status: AC
  Administered 2021-03-20: 1000 mL via INTRAVENOUS

## 2021-03-20 MED ORDER — EPTIFIBATIDE 20 MG/10ML IV SOLN
INTRAVENOUS | Status: AC
  Filled 2021-03-20: qty 10

## 2021-03-20 MED ORDER — ONDANSETRON HCL 4 MG/2ML IJ SOLN
INTRAMUSCULAR | Status: DC | PRN
  Administered 2021-03-20: 4 mg via INTRAVENOUS

## 2021-03-20 MED ORDER — HYDROCORTISONE SOD SUC (PF) 100 MG IJ SOLR
100.0000 mg | Freq: Two times a day (BID) | INTRAMUSCULAR | Status: DC
  Administered 2021-03-20 – 2021-03-21 (×2): 100 mg via INTRAVENOUS
  Filled 2021-03-20 (×2): qty 2

## 2021-03-20 MED ORDER — FLUDROCORTISONE ACETATE 0.1 MG PO TABS
0.1000 mg | ORAL_TABLET | ORAL | Status: DC
  Filled 2021-03-20: qty 1

## 2021-03-20 MED ORDER — SODIUM CHLORIDE 0.9 % IV SOLN
3.0000 g | Freq: Four times a day (QID) | INTRAVENOUS | Status: DC
  Filled 2021-03-20 (×3): qty 8

## 2021-03-20 MED ORDER — SODIUM CHLORIDE (PF) 0.9 % IJ SOLN
INTRAVENOUS | Status: DC | PRN
  Administered 2021-03-20 (×3): 25 ug via INTRA_ARTERIAL

## 2021-03-20 MED ORDER — CHLORHEXIDINE GLUCONATE 0.12% ORAL RINSE (MEDLINE KIT)
15.0000 mL | Freq: Two times a day (BID) | OROMUCOSAL | Status: DC
  Administered 2021-03-20 – 2021-03-21 (×4): 15 mL via OROMUCOSAL

## 2021-03-20 MED ORDER — TIROFIBAN HCL IN NACL 5-0.9 MG/100ML-% IV SOLN
INTRAVENOUS | Status: AC
  Filled 2021-03-20: qty 100

## 2021-03-20 MED ORDER — ROCURONIUM BROMIDE 10 MG/ML (PF) SYRINGE
PREFILLED_SYRINGE | INTRAVENOUS | Status: DC | PRN
  Administered 2021-03-20: 50 mg via INTRAVENOUS

## 2021-03-20 MED ORDER — TICAGRELOR 90 MG PO TABS
ORAL_TABLET | ORAL | Status: AC
  Filled 2021-03-20: qty 2

## 2021-03-20 MED ORDER — PROPOFOL 10 MG/ML IV BOLUS
INTRAVENOUS | Status: DC | PRN
  Administered 2021-03-20: 60 mg via INTRAVENOUS

## 2021-03-20 MED ORDER — SUCCINYLCHOLINE CHLORIDE 200 MG/10ML IV SOSY
PREFILLED_SYRINGE | INTRAVENOUS | Status: DC | PRN
  Administered 2021-03-20: 100 mg via INTRAVENOUS

## 2021-03-20 NOTE — Progress Notes (Signed)
PT Cancellation Note  Patient Details Name: Shanta Hartner MRN: [id number] DOB: 04-11-1935   Cancelled Treatment:    Reason Eval/Treat Not Completed: Medical issues which prohibited therapy Pt with new code stroke and currently transferring to ICU at [facility] from [facility].  Will hold PT at [facility] today.   Abran Richard, PT Acute Rehab Services Pager [phone] [facility] 04/02/2021, 10:00 AM

## 2021-03-20 NOTE — H&P (Signed)
Neurology H&P Reason for Consult: Right M1 thrombectomy  CC: Left sided weakness   History is obtained from: Chart review, medical team  HPI: Leslie Petersen is a 86 y.o. female with a past medical history significant for remote lung adenocarcinoma, remote bilateral adrenal cortical tumors, glaucoma, hard of hearing, osteoporosis, recently diagnosed with atrial fibrillation felt to be provoked in the setting of pneumonia and not started on anticoagulation  She was seen by Dr. Rory Percy as a telemetry code stroke at [facility] who noted: "85/F with PMH as below who is being treated at [facility] for new onset paroxysmal Afib not on AC, NSTEMI,  CAP vs Aspitration pna, dehydration and toxic metabolic derangements, who is normal AAOx4 per RN, was noted this AM to be having difficutly with speech, left sided weakness and neglect, left facial droop and right gaze preference with inability to cross midline to left. Code stroke was activated. LKW was last night at 2100 when she went to bed and upon awakening this AM, she was noted to be waking with these symptoms. Per patient daughter-Michelle-she is living with the daughter, quite independent at baseline-is what I was told initially but later on she said that she has been requiring more help as the time has gone on.  Her lung cancer has been stable-she has been remission for the past 3 years and walks with a cane and sometimes with a walker."  Risks and benefits of intra-arterial thrombectomy were discussed with the patient's family by Dr. Rory Percy as well as Dr. Estanislado Pandy and patient was taken for procedure.  On my evaluation her examination (documented below) was slightly worse than documented by Dr. Rory Percy, which on rechecking her glucose I attributed to possibly low glucose, which was corrected prior to procedure with IV dextrose treatment.  Additionally I initiated a fluid bolus of 500 cc normal saline due to contrast load and relatively low blood pressures  given large vessel occlusion    LKW: 9 PM on 10/3 tPA given?: No, out of the window IA performed?:  Yes, TICI 3 reperfusion with 1 pass Premorbid modified rankin scale: 2-3     2 - Slight disability. Able to look after own affairs without assistance, but unable to carry out all previous activities.     3 - Moderate disability. Requires some help, but able to walk unassisted.  ROS: Unable to obtain due to altered mental status.   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   Esophageal polyp    GERD (gastroesophageal reflux disease)    Glaucoma    Hard of hearing    History of hiatal hernia    Lung cancer, upper lobe, left 2011   adenocarcinoma resected    Metastasis to adrenal gland ([place]) dx'd 1994, 2011   1994 left, 2011 right, back tumor 2015   Osteoporosis    S/P radiation therapy 9/26-16-03/20/15   SBRT LUL 54 Gy/82fx   Urinary frequency    Wears glasses    Past Surgical History:  Procedure Laterality Date   ADRENAL GLAND SURGERY  1990/2011   CATARACT EXTRACTION, BILATERAL Bilateral    COLONOSCOPY  multiple   ESOPHAGOGASTRODUODENOSCOPY     ESOPHAGOGASTRODUODENOSCOPY N/A 09/16/2014   Procedure: ESOPHAGOGASTRODUODENOSCOPY (EGD);  Surgeon: [name], MD;  Location: [facility];  Service: Endoscopy;  Laterality: N/A;   EYE SURGERY     HOT HEMOSTASIS N/A 09/16/2014   Procedure: HOT HEMOSTASIS (ARGON PLASMA COAGULATION/BICAP);  Surgeon: [name], MD;  Location: [facility];  Service: Endoscopy;  Laterality: N/A;   LUNG CANCER SURGERY  08/2009   malignant tumor from back  June 2015   at [facility] "related to adrenal gland cancer" per pt   TUBAL LIGATION     VIDEO BRONCHOSCOPY WITH ENDOBRONCHIAL NAVIGATION N/A 02/06/2015   Procedure: VIDEO BRONCHOSCOPY WITH ENDOBRONCHIAL NAVIGATION;   Surgeon: Grace Isaac, MD;  Location: [facility];  Service: Thoracic;  Laterality: N/A;   VIDEO BRONCHOSCOPY WITH ENDOBRONCHIAL ULTRASOUND N/A 10/18/2014   Procedure: VIDEO BRONCHOSCOPY WITH ENDOBRONCHIAL ULTRASOUND;  Surgeon: Grace Isaac, MD;  Location: [place];  Service: Thoracic;  Laterality: N/A;   VIDEO BRONCHOSCOPY WITH ENDOBRONCHIAL ULTRASOUND N/A 02/06/2015   Procedure: VIDEO BRONCHOSCOPY WITH ENDOBRONCHIAL ULTRASOUND;  Surgeon: Grace Isaac, MD;  Location: [facility];  Service: Thoracic;  Laterality: N/A;   Current Outpatient Medications  Medication Instructions   atorvastatin (LIPITOR) 10 mg, Oral, Daily at bedtime   fludrocortisone (FLORINEF) 0.1 mg, Oral, Daily, At 8:00 am   gabapentin (NEURONTIN) 300 MG capsule Take one pill tid   hydrocortisone (CORTEF) 5-10 mg, Oral, As directed, Take 10 mg tablet by mouth at 8:00 am 5 mg tablet at noon   latanoprost (XALATAN) 0.005 % ophthalmic solution 1 drop, Both Eyes, Daily at bedtime   Multiple Vitamins-Minerals (MULTIVITAMIN WITH MINERALS) tablet 1 tablet, Oral, Daily after lunch   olmesartan (BENICAR) 40 mg, Oral, Daily   Pseudoephedrine HCl (SINUS 12 HOUR PO) 1 tablet, Oral, Daily   Vitamin D3 1,000 Units, Oral, Daily      Family History  Problem Relation Age of Onset   Heart attack Mother    Heart attack Father     Social History:  reports that she quit smoking about 32 years ago. She has never used smokeless tobacco. She reports that she does not drink alcohol and does not use drugs.   Exam: Current vital signs: BP (!) 147/63   Pulse 73   Temp 97.8 F (36.6 C) (Axillary)   Resp 12   Ht 5\' 4"  (1.626 m)   Wt 58.3 kg   SpO2 100%   BMI 22.06 kg/m  Vital signs in last 24 hours: Temp:  [97.8 F (36.6 C)-98.9 F (37.2 C)] 97.8 F (36.6 C) (10/04 0359) Pulse Rate:  [73-104] 73 (10/04 1121) Resp:  [12-28] 12 (10/04 1121) BP: (130-157)/(49-132) 147/63 (10/04 1121) SpO2:  [90 %-100 %] 100 % (10/04 1121) FiO2 (%):   [50 %] 50 % (10/04 1121) Weight:  [58.3 kg] 58.3 kg (10/04 0500)   Physical Exam  Constitutional: Appears pale and chronically ill, thin Psych: Affect flat, minimally interactive Eyes: No scleral injection HENT: No oropharyngeal obstruction.  MSK: no joint deformities.  Cardiovascular: Tachycardic but regular rhythm on monitor and palpation of pulses Respiratory: Increased respiratory rate 30-40 times a minute GI: Soft.  No distension. There is no tenderness.  Skin: Warm dry and intact visible skin  Neuro: Mental Status: Patient is very somnolent, but with noxious stimulation does open her eyes and then does keep them open to briefly track examiner to midline. Cranial Nerves: II: Visual Fields are notable for a left hemianopia. Pupils are equal, round, and reactive to light.  6 mm to 2 mm bilaterally III,IV, VI: Right  gaze preference, crosses just past midline with VOR, does not track past midline V: Facial sensation is symmetric to eyelash brush VII: Facial movement is notable for left facial droop.  VIII: hearing is intact to voice Remainder unable to assess given mental status Motor: She briefly holds the right upper extremity antigravity and has extremely increased tone in the right upper extremity, unclear if paratonia versus spasticity on brief examination.  She withdraws the bilateral lower extremities fairly equally to mild noxious stimulation of tickle, just slightly brisker on the right than the left, but not antigravity either side.  Left upper extremity has some flexor posturing Sensory: Appears grossly equally reactive to noxious stimulation in all 4 extremities Deep Tendon Reflexes: Present and symmetric in the patellae Plantars: Toes are downgoing on the right and upgoing on the left Cerebellar: Unable to assess secondary to patient's mental status   NIHSS total 29 Score breakdown: 2 points for stuporous patient, 2 points for not answering questions, one-point for  only following some commands, 2 points for gaze deviation to the right, 2 points for field cut on the left, 2 points for facial droop on the left, 3 points for left upper extremity weakness, 2 points for right upper extremity weakness, 3 points for left lower extremity weakness, 3 points for right lower extremity weakness, 3 points for mutism, 2 points for severe dysarthria, 2 points for neglect of the left side The score was performed within 5 minutes of patient arrival to [facility], prior to IR procedure   I have reviewed labs in epic and the results pertinent to this consultation are:   Basic Metabolic Panel: Recent Labs  Lab 03/14/2021 1416 03/14/2021 1915 03/17/21 0312 03/18/21 0318 03/19/21 0315 03/28/2021 0456  NA 145  --  141 145 142 141  K 3.2*  --  4.1 3.1* 3.5 3.2*  CL 110  --  112* 114* 111 110  CO2 26  --  25 22 25 26   GLUCOSE 115*  --  87 82 99 81  BUN 15  --  14 12 14 11   CREATININE 0.74  --  0.58 0.54 0.62 0.50  CALCIUM 8.7*  --  7.7* 8.2* 8.1* 8.2*  MG  --  1.7 2.3 2.0 2.2  --     CBC: Recent Labs  Lab 03/15/2021 1416 03/17/21 0312 03/18/21 0318 03/19/21 0315 04/15/2021 0456  WBC 8.4 6.4 6.1 6.4 9.1  NEUTROABS 4.9 4.4 4.0  --  6.5  HGB 10.6* 9.8* 10.0* 9.2* 10.1*  HCT 32.7* 30.0* 29.5* 27.8* 29.2*  MCV 112.0* 110.7* 109.3* 109.9* 106.6*  PLT 147* 114* 119* 123* 130*    Coagulation Studies: No results for input(s): LABPROT, INR in the last 72 hours.   Lab Results  Component Value Date   CHOL 78 04/11/2021   HDL 37 (L) 03/25/2021   LDLCALC 30 04/15/2021   TRIG 55 04/05/2021   CHOLHDL 2.1 03/23/2021   Lab Results  Component Value Date   HGBA1C 5.1 04/08/2021    ECHO: 03/17/21  1. Left ventricular ejection fraction, by estimation, is 65 to 70%. The left ventricle has normal function. The left ventricle has no regional wall motion abnormalities. There is moderate left ventricular hypertrophy of the basal-septal segment. Left ventricular diastolic  parameters are consistent with Grade I diastolic dysfunction (impaired relaxation). Elevated left ventricular end-diastolic pressure.   2. Right ventricular systolic function is normal. The right ventricular size is normal. There is normal pulmonary artery systolic pressure.  3. The mitral valve is degenerative. Mild mitral valve regurgitation. No evidence of mitral stenosis.   4. The aortic valve is normal in structure. Aortic valve regurgitation is mild. No aortic stenosis is present.   5. The inferior vena cava is normal in size with greater than 50% respiratory variability, suggesting right atrial pressure of 3 mmHg.     Glucose at [facility] was 75 at 8 AM, noted to be 47 on arrival to [facility], for which an amp of IV dextrose was administered  I have reviewed the images obtained:  Head CT with aspects of 7, right MCA territory hypodensity and hyperdense vessel sign on my read (note corrected radiology read which initially reported left MCA territory) CTA with right M2 occlusion CT perfusion with 32 cc of tissue at risk and 15 cc core, potentially slight overestimate of tissue at risk, note there is also concern for potential recurrence of her malignancy incidentally found on this evaluation.  Full radiology read: 1. Positive for ELVO: Right MCA M2 branch occlusion, middle Sylvian division. 2. CT Perfusion detects a an infarct core of 15 mL which appears fairly concordant to the ASPECTS of 7. However, some of the 32 mL penumbra is artifactual (in the left temporal lobe). And Hypoperfusion Index is 0.6. 3. Right CCA and carotid bifurcation atherosclerosis without significant stenosis. Mild atherosclerosis elsewhere in the head and neck. Aortic Atherosclerosis (ICD10-I70.0). 4. Emphysema (ICD10-J43.9) and posttreatment changes to both visible lungs. Small but increased anterior carina mediastinal lymph node since last year, with questionable increased right hilar nodal tissue raising the  possibility of active right lung cancer. Small layering right pleural effusion.   Impression: 86 year old woman presenting with likely cardioembolic stroke, potentially exacerbated by hypercoagulability in the setting of adenocarcinoma given CTA findings concerning for increased right hilar nodal tissue concerning for infection versus malignancy  Recommendations:  Plan:  Cerebral infarction due to embolism of right middle cerebral artery  Acuity: Acute Current Suspected Etiology: Cardioembolic +/- hypercoagulability -Admit to: Stroke team -Antiplatelet aspirin 325 daily if cleared by neuro interventional radiology -Blood pressure control, goal of SYS 120-140 -MRI brain without contrast -Meeting LDL and A1c goals as above, continue home atorvastatin 10 mg -SSI to maintain glucose 140-180mg /dL;  -PT/OT/ST therapies and recommendations when able  CNS Dysarthria Dysphagia following cerebral infarction  -NPO until cleared by speech -ST -Advance diet as tolerated -May need PEG  Hemiplegia and hemiparesis following cerebral infarction affecting left non-dominant side  -PT/OT -PM&R consult   RESP Acute Respiratory Failure  -vent management per ICU -wean when able  CV  Heart failure, diastolic NSTEMI New onset atrial fibrillation -Cards consulted at [facility], appreciate them following along -TTE completed as above, no need to repeat from a neurological indication at this time -Continue BB  History of home statin use: - Statin for goal LDL < 70  Paroxysmal atrial fibrillation -Rate control -Continue BB -Anticoagulation per stroke team once post procedure MRI completed, discontinue antiplatelet at that time   HEME Anemia likely secondary to chronic Diseases, currently hemoglobin in the 10s -Monitor -transfuse for hgb < 7  Thrombocytopenia, possibly secondary to malignancy, currently platelets in the 130s -Continue to monitor  ENDO Adrenal insufficiency  secondary to prior adrenalectomy -Continue Florinef and Cortef, consider endocrine consult, may need stress dose steroids, defer to CCM  GI/GU No active issues -Gentle hydration in the setting of her diastolic heart failure -avoid nephrotoxic agents  Fluid/Electrolyte Disorders Mild hypokalemia -Replete for derangements as needed -Repeat  labs including magnesium -Trend  ID Possible community-acquired versus possible aspiration PNA -CXR -NPO -Antibiotics per CCM -Monitor  Nutrition E66.9 Obesity  E46 Protein-Calorie Malnutrition Mild Moderate Severe -diet consult  Prophylaxis DVT:  SCDs GI: Protonix Bowel: Miralax  Diet: NPO until cleared by speech  Code Status: DNR   THE FOLLOWING WERE PRESENT ON ADMISSION: CNS -  Acute Ischemic Stroke, Hemiparesis,  Respiratory - Community Acquired Pneumonia versus malignancy, Probable Aspiration Pneumonia given mental status Cardiovascular - Acute Chronic Systolic Diastolic CHF, NSTEMI, Cardiac Arrhythmia (paroxysmal atrial fibrillation),  Infectious -possible pneumonia versus recurrence of malignancy Heme/onc -history of multiple malignancies as detailed above Endocrine -- adrenal insufficiency DNR CODE STATUS  Lesleigh Noe MD-PhD [facility] [phone] Available 7 AM to 7 PM, outside these hours please contact Neurologist on call listed on AMION    Total critical care time: 80 minutes   Critical care time was exclusive of separately billable procedures and treating other patients.   Critical care was necessary to treat or prevent imminent or life-threatening deterioration.   Critical care was time spent personally by me on the following activities: development of treatment plan with patient and/or surrogate as well as nursing, discussions with consultants/primary team, evaluation of patient's response to treatment, examination of patient, obtaining history from patient or surrogate, ordering and performing  treatments and interventions, ordering and review of laboratory studies, ordering and review of radiographic studies, and re-evaluation of patient's condition as needed, as documented above.

## 2021-03-20 NOTE — Procedures (Signed)
Rt internal carotid arteriogram followed by revascularization of occluded RT MCa M1 with x2 passes with solitaire 49mmx 40 mm retriever and contact aspiration achieving a TICI 3 revascualrization. Pos CT brain No ICH. 8Fangioseal for RT CFA hemostasis. Distal pulses dopplerable bilaterally. Patient left intubated due to pretreatment  lack of responsiveness. S.Pesach Frisch MD

## 2021-03-20 NOTE — Anesthesia Preprocedure Evaluation (Addendum)
Anesthesia Evaluation  Patient identified by MRN, date of birth, ID band Patient unresponsive    Reviewed: Allergy & Precautions, Patient's Chart, lab work & pertinent test results, Unable to perform ROS - Chart review onlyPreop documentation limited or incomplete due to emergent nature of procedure.  History of Anesthesia Complications Negative for: history of anesthetic complications  Airway   TM Distance: >3 FB    Comment:  Unable to perform adequate airway exam given patient mental status  Dental  (+) Chipped   Pulmonary former smoker,   Hx lung cancer s/p resection      + decreased breath sounds      Cardiovascular hypertension, Pt. on medications + Past MI (few days ago)  + dysrhythmias Atrial Fibrillation  Rhythm:Regular Rate:Tachycardia   '22 TTE - EF 65 to 70%. Moderate left ventricular hypertrophy of the basal-septal segment. Grade I diastolic  dysfunction (impaired relaxation).Mild mitral valve regurgitation. Aortic valve regurgitation is mild.    Neuro/Psych CVA, Residual Symptoms    GI/Hepatic hiatal hernia, GERD  ,  Endo/Other   Hx metastatic cancer to adrenal glands s/p resection on fludrocortisone and hydrocortisone K 3.2   Renal/GU      Musculoskeletal  (+) Arthritis ,   Abdominal   Peds  Hematology  (+) anemia ,  Plt 130k    Anesthesia Other Findings   Reproductive/Obstetrics                            Anesthesia Physical Anesthesia Plan  ASA: 4 and emergent  Anesthesia Plan: General   Post-op Pain Management:    Induction: Intravenous  PONV Risk Score and Plan: 3 and Treatment may vary due to age or medical condition and Ondansetron  Airway Management Planned: Oral ETT  Additional Equipment: Arterial line  Intra-op Plan:   Post-operative Plan: Post-operative intubation/ventilation  Informed Consent:   Patient has DNR.   History available  from chart only and Only emergency history available  Plan Discussed with: CRNA, Anesthesiologist and Surgeon  Anesthesia Plan Comments: (Confirmed with surgeon that extensive discussion with patient's daughter took place prior to start of this procedure)       Anesthesia Quick Evaluation

## 2021-03-20 NOTE — Anesthesia Postprocedure Evaluation (Signed)
Anesthesia Post Note  Patient: Advertising copywriter  Procedure(s) Performed: IR WITH ANESTHESIA     Patient location during evaluation: ICU Anesthesia Type: General Level of consciousness: sedated and patient remains intubated per anesthesia plan Pain management: pain level controlled Vital Signs Assessment: post-procedure vital signs reviewed and stable Respiratory status: patient remains intubated per anesthesia plan Cardiovascular status: stable Postop Assessment: no apparent nausea or vomiting Anesthetic complications: no   No notable events documented.  Last Vitals:  Vitals:   03/25/2021 1505 04/08/2021 1515  BP: 130/65 (!) 134/48  Pulse: 95 91  Resp: (!) 24 (!) 24  Temp:    SpO2: 98% 100%    Last Pain:  Vitals:   03/31/2021 1300  TempSrc: Axillary  PainSc:                  Audry Pili

## 2021-03-20 NOTE — Progress Notes (Signed)
Pt intubated and under the care of anesthesia. Benjie Karvonen, CRNA present for case

## 2021-03-20 NOTE — Progress Notes (Signed)
Progress Note  Patient Name: Leslie Petersen Date of Encounter: 04/09/2021  [facility] Cardiologist: Minus Breeding, MD   Subjective   Patient intubated      Inpatient Medications    Scheduled Meds:   stroke: mapping our early stages of recovery book   Does not apply Once   aspirin       cangrelor       chlorhexidine gluconate (MEDLINE KIT)  15 mL Mouth Rinse BID   Chlorhexidine Gluconate Cloth  6 each Topical Daily   clopidogrel       enoxaparin (LOVENOX) injection  30 mg Subcutaneous Q24H   eptifibatide       feeding supplement  237 mL Oral TID BM   [START ON 03/21/2021] fludrocortisone  0.1 mg Per Tube Q24H   [START ON 03/21/2021] gabapentin  300 mg Oral QHS   guaiFENesin  1,200 mg Oral BID   hydrocortisone sod succinate (SOLU-CORTEF) inj  100 mg Intravenous Q12H   latanoprost  1 drop Both Eyes QHS   mouth rinse  15 mL Mouth Rinse 10 times per day   nitroGLYCERIN       sodium chloride flush  3 mL Intravenous Q12H   ticagrelor       verapamil       Continuous Infusions:  sodium chloride 75 mL/hr at 04/13/2021 1500   sodium chloride     sodium chloride     ampicillin-sulbactam (UNASYN) IV Stopped (03/26/2021 1437)   ceFAZolin     clevidipine     phenylephrine (NEO-SYNEPHRINE) Adult infusion 200 mcg/min (04/12/2021 1545)   tirofiban     PRN Meds: acetaminophen **OR** acetaminophen (TYLENOL) oral liquid 160 mg/5 mL **OR** acetaminophen, guaiFENesin, ipratropium, levalbuterol, menthol-cetylpyridinium, polyethylene glycol, QUEtiapine   Vital Signs    Vitals:   03/18/2021 1602 04/13/2021 1615 04/15/2021 1630 03/29/2021 1645  BP:  (!) 131/51 (!) 134/51 (!) 141/73  Pulse: (!) 109 84 80 (!) 112  Resp: (!) 25 20 19  (!) 28  Temp:      TempSrc:      SpO2: 100% 100% 100% 98%  Weight:      Height:        Intake/Output Summary (Last 24 hours) at 04/14/2021 1648 Last data filed at 03/17/2021 1500 Gross per 24 hour  Intake 2155.73 ml  Output 625 ml  Net 1530.73 ml   Last 3  Weights 04/04/2021 04/08/2021 03/19/2021  Weight (lbs) 115 lb 1.3 oz 128 lb 8.5 oz 128 lb 8.5 oz  Weight (kg) 52.2 kg 58.3 kg 58.3 kg      Telemetry    ST - Personally Reviewed  ECG     No new    Personally Reviewed  Physical Exam   GEN  Pt intubated    Neck: JVP mildly increased  Cardiac: RRR, II/VI systolic murmurs, Respiratory: Rhonchi bilaterally   GI: Soft, nontender, non-distended  MS: No edema; No deformity. Neuro:  Deferred    Labs    High Sensitivity Troponin:   Recent Labs  Lab 02/15/2021 1915 03/09/2021 2139  TROPONINIHS 3,493* 3,035*     Chemistry Recent Labs  Lab 03/09/2021 1416 02/20/2021 1915 03/17/21 0312 03/18/21 0318 03/19/21 0315 04/01/2021 0456  NA 145  --  141 145 142 141  K 3.2*  --  4.1 3.1* 3.5 3.2*  CL 110  --  112* 114* 111 110  CO2 26  --  25 22 25 26   GLUCOSE 115*  --  87 82 99 81  BUN 15  --  14 12 14 11   CREATININE 0.74  --  0.58 0.54 0.62 0.50  CALCIUM 8.7*  --  7.7* 8.2* 8.1* 8.2*  MG  --    < > 2.3 2.0 2.2  --   PROT 6.2*  --   --   --   --   --   ALBUMIN 2.9*  --   --   --   --   --   AST 36  --   --   --   --   --   ALT 21  --   --   --   --   --   ALKPHOS 48  --   --   --   --   --   BILITOT 1.2  --   --   --   --   --   GFRNONAA >60  --  >60 >60 >60 >60  ANIONGAP 9  --  4* 9 6 5    < > = values in this interval not displayed.    Lipids  Recent Labs  Lab 04/02/2021 0456  CHOL 78  TRIG 55  HDL 37*  LDLCALC 30  CHOLHDL 2.1    Hematology Recent Labs  Lab 03/18/21 0318 03/19/21 0315 03/19/2021 0456  WBC 6.1 6.4 9.1  RBC 2.70* 2.53* 2.74*  HGB 10.0* 9.2* 10.1*  HCT 29.5* 27.8* 29.2*  MCV 109.3* 109.9* 106.6*  MCH 37.0* 36.4* 36.9*  MCHC 33.9 33.1 34.6  RDW 13.3 13.6 13.2  PLT 119* 123* 130*   Thyroid  Recent Labs  Lab 03/03/2021 1915  TSH 2.628    BNPNo results for input(s): BNP, PROBNP in the last 168 hours.  DDimer No results for input(s): DDIMER in the last 168 hours.   Radiology    CT HEAD CODE STROKE WO  CONTRAST  Addendum Date: 03/25/2021   ADDENDUM REPORT: 04/13/2021 08:41 ADDENDUM: Correction, RIGHT MCA territory ischemia with right side ASPECTS 7. Study was discussed by telephone with Dr. Irine Seal on 03/21/2021 at 0836 hours, and also with Dr. Rory Percy via text message at [phone] hours. Electronically Signed   By: Genevie Ann M.D.   On: 03/19/2021 08:41   Result Date: 03/26/2021 CLINICAL DATA:  Code stroke.  86 year old female. EXAM: CT HEAD WITHOUT CONTRAST TECHNIQUE: Contiguous axial images were obtained from the base of the skull through the vertex without intravenous contrast. COMPARISON:  Brain MRI 12/25/2011. FINDINGS: Brain: Cerebral volume is not significantly changed since 2013. No midline shift, mass effect, or evidence of intracranial mass lesion. Cytotoxic edema in the right frontal lobe (series 4, image 24) also involving the right operculum and insula. No acute hemorrhage identified. Normal basilar cisterns. Underlying patchy in chronic bilateral white matter hypodensity. New since 2013 but chronic appearing bilateral thalamic lacunar infarcts. No ventriculomegaly. Vascular: Mild Calcified atherosclerosis at the skull base. No suspicious intracranial vascular hyperdensity. Skull: Mild motion artifact. No acute osseous abnormality identified. Sinuses/Orbits: Visualized paranasal sinuses and mastoids are clear. Other: Rightward gaze. Postoperative changes to both globes. No acute scalp soft tissue finding. ASPECTS [facility] Stroke Program Early CT Score) Total score (0-10 with 10 being normal): 7 (abnormal right M4, M 5, and insula segments). IMPRESSION: 1. Positive for left MCA territory cytotoxic edema compatible with acute or subacute infarct. ASPECTS 7. No associated hemorrhage or mass effect. 2. Evidence of advanced underlying small vessel disease, progressed since 2013 in the bilateral thalami. Electronically Signed: By: Genevie Ann M.D. On:  03/18/2021 08:29   CT ANGIO HEAD NECK W WO CM W PERF  (CODE STROKE)  Result Date: 03/21/2021 CLINICAL DATA:  86 year old female code stroke presentation with right MCA ischemia on plain head CT. History of lung cancer. EXAM: CT ANGIOGRAPHY HEAD AND NECK CT PERFUSION BRAIN TECHNIQUE: Multidetector CT imaging of the head and neck was performed using the standard protocol during bolus administration of intravenous contrast. Multiplanar CT image reconstructions and MIPs were obtained to evaluate the vascular anatomy. Carotid stenosis measurements (when applicable) are obtained utilizing NASCET criteria, using the distal internal carotid diameter as the denominator. Multiphase CT imaging of the brain was performed following IV bolus contrast injection. Subsequent parametric perfusion maps were calculated using RAPID software. CONTRAST:  64m OMNIPAQUE IOHEXOL 350 MG/ML SOLN COMPARISON:  Plain head CT 0821 hours today. Chest CT 05/09/2020 and earlier. FINDINGS: CT Brain Perfusion Findings: ASPECTS: 7 CBF (<30%) Volume: 151mPerfusion (Tmax>6.0s) volume: 3255m although some of this is in the inferior left temporal lobe and appears artifactual. Also, hypoperfusion index is 0.6, with 18 mL of T-max greater than 10. Mismatch Volume: Less than 37m51mfarction Location:Right MCA CTA NECK Skeleton: Torus palatinus, normal variant. Widespread cervical spine degeneration. No acute osseous abnormality identified. Upper chest: Small layering right pleural effusion is new from last year. Confluent right perihilar scarring and atelectasis with underlying centrilobular emphysema, previous partial left pneumonectomy. Small but increased precarinal lymph node on series 5, image 182 measures 10 mm short axis now (previously 7 mm) and there is questionable increased right hilar soft tissue on series 5, image 186. Other neck: Small volume retained secretions in the pharynx. Otherwise negative. Aortic arch: 3 vessel arch configuration with moderate to severe calcified arch atherosclerosis.  Right carotid system: Brachiocephalic artery plaque without significant stenosis. Negative right CCA origin. Calcified plaque at the right carotid bifurcation, right ICA origin with less than 50 % stenosis with respect to the distal vessel. The right ICA is mildly tortuous and patent to the skull base. Left carotid system: Mild calcified plaque at the left CCA origin and left carotid bifurcation without stenosis. Tortuous left ICA below the skull base. Vertebral arteries: Calcified plaque in the proximal right subclavian artery with less than 50% stenosis. Normal right vertebral artery origin. Tortuous right vertebral artery appears patent to the skull base without stenosis. Moderate atherosclerosis in the proximal left subclavian artery without significant stenosis. Left vertebral artery origin is spared. Left vertebral artery is fairly codominant and tortuous, patent to the V3 segment without stenosis. There is mild irregularity in the distal V3 segment and at the skull base without significant stenosis. CTA HEAD Posterior circulation: Tortuous distal vertebral arteries, the left is mildly dominant. Patent right PICA origin. Patent vertebrobasilar junction without stenosis. Patent basilar artery without stenosis. SCA and left PCA origin are patent. Fetal type right PCA origin is patent. Bilateral PCA branches are within normal limits. Anterior circulation: Both ICA siphons are patent. Minimal ICA siphon plaque and no siphon stenosis. Early cavernous sinus enhancement. Normal right posterior communicating artery origin. Patent carotid termini. Patent MCA and ACA origins. Diminutive anterior communicating artery. Bilateral ACA branches, left MCA M1 segment, left MCA bifurcation, and left MCA branches are within normal limits. Right MCA M1 segment and trifurcation are patent but there is a right MCA M2 branch occlusion on series 9, image 37, series 12, image 9 at the middle sylvian division. Mild irregularity of the  other enhancing right MCA branches. Venous sinuses: Patent. Anatomic variants: Mildly dominant  left vertebral artery V4 segment. Review of the MIP images confirms the above findings IMPRESSION: 1. Positive for ELVO: Right MCA M2 branch occlusion, middle Sylvian division. 2. CT Perfusion detects a an infarct core of 15 mL which appears fairly concordant to the ASPECTS of 7. However, some of the 32 mL penumbra is artifactual (in the left temporal lobe). And Hypoperfusion Index is 0.6. 3. Right CCA and carotid bifurcation atherosclerosis without significant stenosis. Mild atherosclerosis elsewhere in the head and neck. Aortic Atherosclerosis (ICD10-I70.0). 4. Emphysema (ICD10-J43.9) and posttreatment changes to both visible lungs. Small but increased anterior carina mediastinal lymph node since last year, with questionable increased right hilar nodal tissue raising the possibility of active right lung cancer. Small layering right pleural effusion. Salient findings were discussed with Dr. Rory Percy beginning at 0839 hours on 03/29/2021 by text messaging. Electronically Signed   By: Genevie Ann M.D.   On: 03/19/2021 09:01    Cardiac Studies   Echo  03/17/21  eft ventricular ejection fraction, by estimation, is 65 to 70%. The left ventricle has normal function. The left ventricle has no regional wall motion abnormalities. There is moderate left ventricular hypertrophy of the basal-septal segment. Left ventricular diastolic parameters are consistent with Grade I diastolic dysfunction (impaired relaxation). Elevated left ventricular end-diastolic pressure. 1. Right ventricular systolic function is normal. The right ventricular size is normal. There is normal pulmonary artery systolic pressure. 2. The mitral valve is degenerative. Mild mitral valve regurgitation. No evidence of mitral stenosis. 3. The aortic valve is normal in structure. Aortic valve regurgitation is mild. No aortic stenosis is present. 4. The  inferior vena cava is normal in size with greater than 50% respiratory variability, suggesting right atrial pressure of 3 mmHg.  Patient Profile     86 y.o. female admitted with weakness, possible pneumonia   New onsiet Afib   Now with CVA  Assessment & Plan    1  CVA   Pt now on neuro service   Intubated  Underwent thrombectomy Most likely due to PAF   Episode in hospital was transient (less that 24 hours)   Should not have lead to neuro event   Probably had more prior to admit  Currently requiring pressors and IV fluids to maintain BP   2  PAF   Follow on telemetry  Short spell while at [facility]  (self limited)  None since   Probably had before   3   Elevated troponin  Peak troponin  3493    Felt demand in setting of sepsis and 3 vessel dz.   No CP      Overall px is guarded.    For questions or updates, please contact [place] Please consult [email or web address] for contact info under        Signed, Dorris Carnes, MD  04/11/2021, 4:48 PM

## 2021-03-20 NOTE — Progress Notes (Signed)
eLink Physician-Brief Progress Note Patient Name: Leslie Petersen DOB: [birth year] MRN: [id number]   Date of Service  03/30/2021  HPI/Events of Note  Patient planned for extubation in the AM but is quite agitated on minimal sedation currently (PRN iv Fentanyl only).  eICU Interventions  Low infusion rate Precedex ordered for over night comfort.        Leslie Petersen 04/08/2021, 9:08 PM

## 2021-03-20 NOTE — Progress Notes (Addendum)
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  Patient seen in the ED noted to have elevated lactic acid level, noted to be new onset A. fib requiring Cardizem drip, chest x-ray concerning for right upper lobe pneumonia.  Patient admitted to the stepdown unit, placed empirically on IV antibiotics, urinalysis concerning for UTI.  Patient converted to sinus rhythm on Cardizem drip.  Cardiology consulted.  Patient noted to have a elevated troponin of 3493 with concerns from out of hospital non-STEMI.  2D echo pending.  Cardiology consulted and are following.   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  Came and assessed patient patient with a left facial droop, left-sided hemineglect, left-sided weakness.  Concern for acute CVA as patient had presented with new onset paroxysmal atrial fibrillation which was noted to be transient and patient was back in sinus rhythm. -Patient last known normal last night specific time unknown however likely before  midnight as patient was known to have received some Seroquel around 2110 hrs. -Code stroke activated. -Stat head CT done for code stroke, was called by radiology that patient likely had an acute to subacute right MCA territory infarct with no hemorrhage or no mass-effect. -Stroke order set placed. -N.p.o. until RN stroke swallow evaluation. -Check MRI head MRA head and neck, carotid Dopplers. -Patient with recent 2D echo and as such we will hold off on repeating it. -Patient already on aspirin. -Spoke with neuro hospitalist who recommended holding IV heparin at this time due to concern for possible hemorrhagic conversion. -PT/OT/SLP. -Permissive hypertension. -Neurology consulted. -Spoke with telemetry neurologist, Dr.Arora who reviewed the films and patient being transferred to [facility] radiology for intervention per neuro interventionalist and will subsequently be transferred to a bed at [facility].  2 CAP versus aspiration pneumonia, sepsis ruled out -Patient on presentation met criteria for sepsis with elevated lactic acid level of 2.4, noted to be hypotensive with systolics in the 37T, noted to be tachycardic with heart rates of 136 and in A. fib.  Chest x-ray concerning for right upper lobe pneumonia. -Blood cultures with no growth to date.  Repeat lactic acid level normalized.   -Urinalysis concerning for possible UTI however urine cultures with insignificant growth..   -Urine strep pneumococcus antigen negative.   -Urine Legionella antigen pending.  -Procalcitonin levels negative.  -Patient seen by speech therapist and diet was advanced to a regular diet which was changed to heart healthy diet in light of non-STEMI.   -Initially on IV Zosyn and transition to oral Augmentin  however transition back to IV Unasyn as patient with an acute CVA and swallow evaluation pending.   -Supportive care.   3.  New onset paroxysmal versus transient atrial  fibrillation Cha2ds2vasc SCORE 4 -Patient presenting with paroxysmal new onset A. fib likely secondary to probleM #1 versus non-STEMI. -Patient converted to sinus rhythm on 02/22/2021. -TSH within normal limits. -2D echo with a EF of 65 to 70%, NWMA, mild mitral regurgitation, mild aortic insufficiency.  . -Patient seen in consultation by cardiology and patient has been transitioned from diltiazem drip to Toprol-XL.Marland Kitchen  -Continue aspirin for now. -On initial presentation, patient not a good anticoagulation candidate and also due to first episode of paroxysmal atrial fibrillation and currently back in sinus rhythm per cardiology recommendations. -Per cardiology.  4.  Non-STEMI -Likely out of hospital non-STEMI. -Patient had presented with A. fib cardiac enzymes drawn noted to be significantly elevated with high-sensitivity troponin at 3493>>> 3035. -Patient denies any ongoing chest pain. -2D echo with a EF of 65 to 70%, NWMA, mild mitral regurgitation, mild aortic insufficiency.   -Fasting lipid panel with LDL of 39.  -Patient seen in consultation by cardiology and patient has been transitioned from Cardizem drip and now on oral beta-blocker with aspirin.   -Due to LDL of 39 statin has been discontinued per cardiology.   -Continue medical management per cardiology recommendations. -Cardiology following and appreciate input and recommendations.  5.  Dehydration Hydrated with IV fluids.  6.  Lactic acidosis Likely secondary to problem #1 versus dehydration.   -Repeat lactic acid level with resolution of lactic acidosis.   -IV fluids discontinued early on but will be resumed in light of problem #1. -Continue antibiotics.   7.  Hypokalemia -Potassium at 3.2, magnesium at 2.2.   -Potassium from IV fluids discontinued.   -KCl 10 mEq IV every hour x4 runs.  -Repeat labs in the morning.   8.  Status post bilateral adrenalectomy -Continue home regimen of Florinef and Cortef.   -IV fluids  saline locked. -Follow.  9.  Generalized weakness -Patient with chronic back pain has had some worsening weakness over the past couple of weeks per daughter with decreased oral intake.  Patient with decreased urine output.  Patient clinically dry on examination on presentation that has improved with hydration. -Could potentially be secondary to non-STEMI. -Hydrated with IV fluids.   -Urine cultures with insignificant growth.  -Blood cultures with no growth x3 days.  -Continue treatment with antibiotics.  -Continue medical management for non-STEMI as recommended by cardiology.   -PT/OT.    10.  Left adnexal cyst -Noted on CT renal stone protocol. -We will need pelvic ultrasound and OB/GYN follow-up in the outpatient setting.  11.  Anemia -No overt bleeding. -Anemia panel consistent with anemia of chronic disease.   -Hemoglobin stable at 10.1 -Transfusion threshold hemoglobin < 7.  -Follow H&H.   12.  UTI versus bacteriuria -Urine cultures with insignificant growth. -Afebrile. -On antibiotics.  13.  Agitation -Patient noted to have some episodes of agitation earlier on during the hospitalization. -None noted overnight. -Seroquel nightly as needed.    DVT prophylaxis: Lovenox Code Status: DNR Family Communication: Updated patient.  Updated daughter via telephone.   Disposition:   Status is: Inpatient  Remains inpatient appropriate because:Inpatient level of care appropriate due to severity of illness  Dispo: The patient is from: Home              Anticipated d/c is to:  TBD  Patient currently is not medically stable to d/c.   Difficult to place patient No       Consultants:  Cardiology: Dr. Percival [place] 02/21/2021  Procedures:  -CT renal stone protocol 03/14/2021 Chest x-ray 02/25/2021 -2D echo 03/17/2021 -CT L-spine 03/06/2021 -CT head code stroke pending -MRI MRA brain pending -Carotid ultrasound pending   Antimicrobials:  IV Zosyn 02/17/2021>>>>  03/19/2021 Augmentin 03/19/2021 >>> 03/19/2021 IV Unasyn 04/08/2021>>>>   Subjective: Was called this morning by RN that patient with altered mental status not responding as well as she did yesterday.  He came to assess patient.  Patient with a left facial droop, left-sided weakness, left-sided hemineglect.  Following some commands.    Objective: Vitals:   03/19/21 1449 03/19/21 1955 04/15/2021 0359 03/27/2021 0500  BP: (!) 154/79 130/90 (!) 149/64   Pulse: 88 88 93   Resp: 20 18 20    Temp: 98.9 F (37.2 C) 98.1 F (36.7 C) 97.8 F (36.6 C)   TempSrc: Oral Oral Axillary   SpO2: 94% 93% 96%   Weight:  58.3 kg  58.3 kg  Height:        Intake/Output Summary (Last 24 hours) at 04/01/2021 0824 Last data filed at 03/19/2021 1300 Gross per 24 hour  Intake 218 ml  Output --  Net 218 ml   Filed Weights   03/19/21 0500 03/19/21 1955 03/19/2021 0500  Weight: 47.1 kg 58.3 kg 58.3 kg    Examination:  General exam: NAD Respiratory system: CTA B anterior lung fields.  No wheezes, no crackles, no rhonchi.  Fair air movement.      Cardiovascular system: RRR no murmurs rubs or gallops.  No JVD.  No lower extremity edema. Gastrointestinal system: Abdomen is soft, nontender, nondistended, positive bowel sounds.  No rebound.  No guarding.  Central nervous system: Alert with left facial droop, left-sided neglect, left upper and left lower extremity weakness.  Sensation intact on the right side.  Following some commands. Extremities: Left-sided weakness/left-sided hemineglect.  Skin: No rashes, lesions or ulcers Psychiatry: Judgement and insight appear fair. Mood & affect appropriate.     Data Reviewed: I have personally reviewed following labs and imaging studies  CBC: Recent Labs  Lab 03/15/2021 1416 03/17/21 0312 03/18/21 0318 03/19/21 0315 04/08/2021 0456  WBC 8.4 6.4 6.1 6.4 9.1  NEUTROABS 4.9 4.4 4.0  --  6.5  HGB 10.6* 9.8* 10.0* 9.2* 10.1*  HCT 32.7* 30.0* 29.5* 27.8* 29.2*  MCV  112.0* 110.7* 109.3* 109.9* 106.6*  PLT 147* 114* 119* 123* 130*    Basic Metabolic Panel: Recent Labs  Lab 03/04/2021 1416 02/23/2021 1915 03/17/21 0312 03/18/21 0318 03/19/21 0315 04/05/2021 0456  NA 145  --  141 145 142 141  K 3.2*  --  4.1 3.1* 3.5 3.2*  CL 110  --  112* 114* 111 110  CO2 26  --  25 22 25 26   GLUCOSE 115*  --  87 82 99 81  BUN 15  --  14 12 14 11   CREATININE 0.74  --  0.58 0.54 0.62 0.50  CALCIUM 8.7*  --  7.7* 8.2* 8.1* 8.2*  MG  --  1.7 2.3 2.0 2.2  --     GFR: Estimated Creatinine Clearance: 46 mL/min (by C-G formula based on SCr of 0.5 mg/dL).  Liver Function Tests: Recent Labs  Lab 03/09/2021 1416  AST 36  ALT 21  ALKPHOS 48  BILITOT 1.2  PROT 6.2*  ALBUMIN 2.9*    CBG: Recent Labs  Lab 04/03/2021 0759  GLUCAP 75     Recent Results (from the past 240 hour(s))  Blood culture (routine single)     Status: None (Preliminary result)   Collection Time: 02/17/2021  2:17 PM   Specimen: BLOOD  Result Value Ref Range Status   Specimen Description   Final    BLOOD SITE NOT SPECIFIED Performed at [facility], [address], [place], [place] [address]    Special Requests   Final    BOTTLES DRAWN AEROBIC AND ANAEROBIC Blood Culture results may not be optimal due to an excessive volume of blood received in culture bottles Performed at [place] [address], [place], [place] [address]    Culture   Final    NO GROWTH 4 DAYS Performed at [facility], [place] [address], [place], [place] [address]    Report Status PENDING  Incomplete  Resp Panel by RT-PCR (Flu A&B, Covid) Nasopharyngeal Swab     Status: None   Collection Time: 02/28/2021  5:36 PM   Specimen: Nasopharyngeal Swab; Nasopharyngeal(NP) swabs in vial transport medium  Result Value Ref Range Status   SARS Coronavirus 2 by RT PCR NEGATIVE NEGATIVE Final    Comment: (NOTE) SARS-CoV-2 target nucleic acids are NOT DETECTED.  The SARS-CoV-2 RNA is generally  detectable in upper respiratory specimens during the acute phase of infection. The lowest concentration of SARS-CoV-2 viral copies this assay can detect is 138 copies/mL. A negative result does not preclude SARS-Cov-2 infection and should not be used as the sole basis for treatment or other patient management decisions. A negative result may occur with  improper specimen collection/handling, submission of specimen other than nasopharyngeal swab, presence of viral mutation(s) within the areas targeted by this assay, and inadequate number of viral copies(<138 copies/mL). A negative result must be combined with clinical observations, patient history, and epidemiological information. The expected result is Negative.  Fact Sheet for Patients:  EntrepreneurPulse.com.au  Fact Sheet for Healthcare Providers:  IncredibleEmployment.be  This test is no t yet approved or cleared by the [place] FDA and  has been authorized for detection and/or diagnosis of SARS-CoV-2 by FDA under an Emergency Use Authorization (EUA). This EUA will remain  in effect (meaning this test can be used) for the duration of the COVID-19 declaration under Section 564(b)(1) of the Act, 21 U.S.C.section 360bbb-3(b)(1), unless the authorization is terminated  or revoked sooner.       Influenza A by PCR NEGATIVE NEGATIVE Final   Influenza B by PCR NEGATIVE NEGATIVE Final    Comment: (NOTE) The Xpert Xpress SARS-CoV-2/FLU/RSV plus assay is intended as an aid in the diagnosis of influenza from Nasopharyngeal swab specimens and should not be used as a sole basis for treatment. Nasal washings and aspirates are unacceptable for Xpert Xpress SARS-CoV-2/FLU/RSV testing.  Fact Sheet for Patients: EntrepreneurPulse.com.au  Fact Sheet for Healthcare Providers: IncredibleEmployment.be  This test is not yet approved or cleared by the [place] FDA  and has been authorized for detection and/or diagnosis of SARS-CoV-2 by FDA under an Emergency Use Authorization (EUA). This EUA will remain in effect (meaning this test can be used) for the duration of the COVID-19 declaration under Section 564(b)(1) of the Act, 21 U.S.C. section 360bbb-3(b)(1), unless the authorization is terminated or revoked.  Performed at [facility], [place] [address], [place], [place] [address]   Culture, blood (routine x 2) Call MD if unable to obtain prior to antibiotics being  given     Status: None (Preliminary result)   Collection Time: 02/23/2021  7:14 PM   Specimen: BLOOD  Result Value Ref Range Status   Specimen Description   Final    BLOOD RIGHT ANTECUBITAL Performed at [place] [address] [place], [place] [address]    Special Requests   Final    BOTTLES DRAWN AEROBIC AND ANAEROBIC Blood Culture adequate volume Performed at [place] [address] [place], [place] [address]    Culture   Final    NO GROWTH 4 DAYS Performed at [facility], [place] [address] [place], [place] [address]    Report Status PENDING  Incomplete  Culture, blood (routine x 2) Call MD if unable to obtain prior to antibiotics being given     Status: None (Preliminary result)   Collection Time: 03/13/2021  7:16 PM   Specimen: BLOOD LEFT HAND  Result Value Ref Range Status   Specimen Description   Final    BLOOD LEFT HAND Performed at [place] [address], [place], [place] [address]    Special Requests   Final    BOTTLES DRAWN AEROBIC AND ANAEROBIC Blood Culture adequate volume Performed at [place] [address], [place], [place] [address]    Culture   Final    NO GROWTH 4 DAYS Performed at [facility], [place] [address] [place], [place] [address]    Report Status PENDING  Incomplete  MRSA Next Gen by PCR, Nasal     Status: None   Collection  Time: 03/12/2021  8:53 PM   Specimen: Nasal Mucosa; Nasal Swab  Result Value Ref Range Status   MRSA by PCR Next Gen NOT DETECTED NOT DETECTED Final    Comment: (NOTE) The GeneXpert MRSA Assay (FDA approved for NASAL specimens only), is one component of a comprehensive MRSA colonization surveillance program. It is not intended to diagnose MRSA infection nor to guide or monitor treatment for MRSA infections. Test performance is not FDA approved in patients less than 71 years old. Performed at [facility], [place] [address] [place], [place] [address]   Urine Culture     Status: Abnormal   Collection Time: 03/17/21  3:01 AM   Specimen: Urine, Catheterized  Result Value Ref Range Status   Specimen Description   Final    URINE, CATHETERIZED Performed at [place] [address], [place], [place] [address]    Special Requests   Final    NONE Performed at [facility], [place] [address] [place], [place] [address]    Culture (A)  Final    <10,000 COLONIES/mL INSIGNIFICANT GROWTH Performed at [place] [address], [place], [place] [address]    Report Status 03/18/2021 FINAL  Final          Radiology Studies: No results found.      Scheduled Meds:   stroke: mapping our early stages of recovery book   Does not apply Once   acetaminophen  500 mg Oral TID   amoxicillin-clavulanate  1 tablet Oral Q12H   aspirin EC  81 mg Oral Daily   Chlorhexidine Gluconate Cloth  6 each Topical Daily   enoxaparin (LOVENOX) injection  30 mg Subcutaneous Q24H   feeding supplement  237 mL Oral TID BM   fludrocortisone  0.1 mg Oral Q24H   gabapentin  300 mg Oral QHS  guaiFENesin  1,200 mg Oral BID   hydrocortisone  10 mg Oral Q24H   hydrocortisone  5 mg Oral Q24H   latanoprost  1 drop Both Eyes QHS   metoprolol succinate  12.5 mg Oral Daily   multivitamin with minerals  1 tablet Oral Daily   pantoprazole  40 mg Oral  Daily   phenazopyridine  100 mg Oral TID WC   sodium chloride flush  3 mL Intravenous Q12H   Continuous Infusions:  potassium chloride       LOS: 4 days    Time spent: 45 minutes    Irine Seal, MD [facility]   To contact the attending provider between 7A-7P or the covering provider during after hours 7P-7A, please log into the web site [email or web address] and access using universal [place] password for that web site. If you do not have the password, please call the hospital operator.  04/12/2021, 8:24 AM

## 2021-03-20 NOTE — Code Documentation (Signed)
Stroke team activated by [facility]- patient with new onset right sided weakness and transported from [place] to [facility] via [place] for IR for intervention. Curly [place], MD at bedside. Dextrose given for CBG 62 and report given to [place], IR RN. See Stroke Documentation for more information.

## 2021-03-20 NOTE — Progress Notes (Signed)
SLP Cancellation Note  Patient Details Name: Leslie Petersen MRN: [id number] DOB: 12-01-1934   Cancelled treatment:       Reason Eval/Treat Not Completed: Patient not medically ready. New code stroke, intubated. Will follow for readiness.    [name], Katherene Ponto 04/09/2021, 10:53 AM

## 2021-03-20 NOTE — Progress Notes (Addendum)
Progress Note  Patient Name: Leslie Petersen Date of Encounter: 04/13/2021  [facility] Cardiologist: Minus Breeding, MD   Subjective   Pt off the floor  Inpatient Medications    Scheduled Meds:   stroke: mapping our early stages of recovery book   Does not apply Once   acetaminophen  500 mg Oral TID   aspirin EC  81 mg Oral Daily   Chlorhexidine Gluconate Cloth  6 each Topical Daily   enoxaparin (LOVENOX) injection  30 mg Subcutaneous Q24H   feeding supplement  237 mL Oral TID BM   fludrocortisone  0.1 mg Oral Q24H   gabapentin  300 mg Oral QHS   guaiFENesin  1,200 mg Oral BID   hydrocortisone  10 mg Oral Q24H   hydrocortisone  5 mg Oral Q24H   latanoprost  1 drop Both Eyes QHS   metoprolol succinate  12.5 mg Oral Daily   multivitamin with minerals  1 tablet Oral Daily   pantoprazole  40 mg Oral Daily   phenazopyridine  100 mg Oral TID WC   sodium chloride flush  3 mL Intravenous Q12H   Continuous Infusions:  ampicillin-sulbactam (UNASYN) IV     potassium chloride     PRN Meds: acetaminophen **OR** acetaminophen, gabapentin, guaiFENesin, ipratropium, ketorolac, levalbuterol, loperamide, menthol-cetylpyridinium, polyethylene glycol, prochlorperazine, QUEtiapine, sorbitol   Vital Signs    Vitals:   03/19/21 1449 03/19/21 1955 03/29/2021 0359 04/06/2021 0500  BP: (!) 154/79 130/90 (!) 149/64   Pulse: 88 88 93   Resp: 20 18 20    Temp: 98.9 F (37.2 C) 98.1 F (36.7 C) 97.8 F (36.6 C)   TempSrc: Oral Oral Axillary   SpO2: 94% 93% 96%   Weight:  58.3 kg  58.3 kg  Height:        Intake/Output Summary (Last 24 hours) at 03/28/2021 0904 Last data filed at 03/19/2021 1300 Gross per 24 hour  Intake 218 ml  Output --  Net 218 ml   Last 3 Weights 03/25/2021 03/19/2021 03/19/2021  Weight (lbs) 128 lb 8.5 oz 128 lb 8.5 oz 103 lb 13.4 oz  Weight (kg) 58.3 kg 58.3 kg 47.1 kg      Telemetry    SR - Personally Reviewed  ECG    No new - Personally  Reviewed  Physical Exam  Pt off the floor Exam per MD  Labs    High Sensitivity Troponin:   Recent Labs  Lab 03/09/2021 1915 03/10/2021 2139  TROPONINIHS 3,493* 3,035*     Chemistry Recent Labs  Lab 02/20/2021 1416 03/10/2021 1915 03/17/21 0312 03/18/21 0318 03/19/21 0315 03/30/2021 0456  NA 145  --  141 145 142 141  K 3.2*  --  4.1 3.1* 3.5 3.2*  CL 110  --  112* 114* 111 110  CO2 26  --  25 22 25 26   GLUCOSE 115*  --  87 82 99 81  BUN 15  --  14 12 14 11   CREATININE 0.74  --  0.58 0.54 0.62 0.50  CALCIUM 8.7*  --  7.7* 8.2* 8.1* 8.2*  MG  --    < > 2.3 2.0 2.2  --   PROT 6.2*  --   --   --   --   --   ALBUMIN 2.9*  --   --   --   --   --   AST 36  --   --   --   --   --  ALT 21  --   --   --   --   --   ALKPHOS 48  --   --   --   --   --   BILITOT 1.2  --   --   --   --   --   GFRNONAA >60  --  >60 >60 >60 >60  ANIONGAP 9  --  4* 9 6 5    < > = values in this interval not displayed.    Lipids  Recent Labs  Lab 03/19/2021 0456  CHOL 78  TRIG 55  HDL 37*  LDLCALC 30  CHOLHDL 2.1    Hematology Recent Labs  Lab 03/18/21 0318 03/19/21 0315 04/02/2021 0456  WBC 6.1 6.4 9.1  RBC 2.70* 2.53* 2.74*  HGB 10.0* 9.2* 10.1*  HCT 29.5* 27.8* 29.2*  MCV 109.3* 109.9* 106.6*  MCH 37.0* 36.4* 36.9*  MCHC 33.9 33.1 34.6  RDW 13.3 13.6 13.2  PLT 119* 123* 130*   Thyroid  Recent Labs  Lab 03/12/2021 1915  TSH 2.628    BNPNo results for input(s): BNP, PROBNP in the last 168 hours.  DDimer No results for input(s): DDIMER in the last 168 hours.   Radiology    CT HEAD CODE STROKE WO CONTRAST  Addendum Date: 03/23/2021   ADDENDUM REPORT: 03/19/2021 08:41 ADDENDUM: Correction, RIGHT MCA territory ischemia with right side ASPECTS 7. Study was discussed by telephone with Dr. Irine Seal on 03/23/2021 at 0836 hours, and also with Dr. Rory Percy via text message at [phone] hours. Electronically Signed   By: Genevie Ann M.D.   On: 04/15/2021 08:41   Result Date: 04/14/2021 CLINICAL  DATA:  Code stroke.  86 year old female. EXAM: CT HEAD WITHOUT CONTRAST TECHNIQUE: Contiguous axial images were obtained from the base of the skull through the vertex without intravenous contrast. COMPARISON:  Brain MRI 12/25/2011. FINDINGS: Brain: Cerebral volume is not significantly changed since 2013. No midline shift, mass effect, or evidence of intracranial mass lesion. Cytotoxic edema in the right frontal lobe (series 4, image 24) also involving the right operculum and insula. No acute hemorrhage identified. Normal basilar cisterns. Underlying patchy in chronic bilateral white matter hypodensity. New since 2013 but chronic appearing bilateral thalamic lacunar infarcts. No ventriculomegaly. Vascular: Mild Calcified atherosclerosis at the skull base. No suspicious intracranial vascular hyperdensity. Skull: Mild motion artifact. No acute osseous abnormality identified. Sinuses/Orbits: Visualized paranasal sinuses and mastoids are clear. Other: Rightward gaze. Postoperative changes to both globes. No acute scalp soft tissue finding. ASPECTS [facility] Stroke Program Early CT Score) Total score (0-10 with 10 being normal): 7 (abnormal right M4, M 5, and insula segments). IMPRESSION: 1. Positive for left MCA territory cytotoxic edema compatible with acute or subacute infarct. ASPECTS 7. No associated hemorrhage or mass effect. 2. Evidence of advanced underlying small vessel disease, progressed since 2013 in the bilateral thalami. Electronically Signed: By: Genevie Ann M.D. On: 03/26/2021 08:29   CT ANGIO HEAD NECK W WO CM W PERF (CODE STROKE)  Result Date: 03/27/2021 CLINICAL DATA:  86 year old female code stroke presentation with right MCA ischemia on plain head CT. History of lung cancer. EXAM: CT ANGIOGRAPHY HEAD AND NECK CT PERFUSION BRAIN TECHNIQUE: Multidetector CT imaging of the head and neck was performed using the standard protocol during bolus administration of intravenous contrast. Multiplanar CT image  reconstructions and MIPs were obtained to evaluate the vascular anatomy. Carotid stenosis measurements (when applicable) are obtained utilizing NASCET criteria, using the distal internal carotid diameter  as the denominator. Multiphase CT imaging of the brain was performed following IV bolus contrast injection. Subsequent parametric perfusion maps were calculated using RAPID software. CONTRAST:  81mL OMNIPAQUE IOHEXOL 350 MG/ML SOLN COMPARISON:  Plain head CT 0821 hours today. Chest CT 05/09/2020 and earlier. FINDINGS: CT Brain Perfusion Findings: ASPECTS: 7 CBF (<30%) Volume: 31mL Perfusion (Tmax>6.0s) volume: 25mL - although some of this is in the inferior left temporal lobe and appears artifactual. Also, hypoperfusion index is 0.6, with 18 mL of T-max greater than 10. Mismatch Volume: Less than 61mL Infarction Location:Right MCA CTA NECK Skeleton: Torus palatinus, normal variant. Widespread cervical spine degeneration. No acute osseous abnormality identified. Upper chest: Small layering right pleural effusion is new from last year. Confluent right perihilar scarring and atelectasis with underlying centrilobular emphysema, previous partial left pneumonectomy. Small but increased precarinal lymph node on series 5, image 182 measures 10 mm short axis now (previously 7 mm) and there is questionable increased right hilar soft tissue on series 5, image 186. Other neck: Small volume retained secretions in the pharynx. Otherwise negative. Aortic arch: 3 vessel arch configuration with moderate to severe calcified arch atherosclerosis. Right carotid system: Brachiocephalic artery plaque without significant stenosis. Negative right CCA origin. Calcified plaque at the right carotid bifurcation, right ICA origin with less than 50 % stenosis with respect to the distal vessel. The right ICA is mildly tortuous and patent to the skull base. Left carotid system: Mild calcified plaque at the left CCA origin and left carotid  bifurcation without stenosis. Tortuous left ICA below the skull base. Vertebral arteries: Calcified plaque in the proximal right subclavian artery with less than 50% stenosis. Normal right vertebral artery origin. Tortuous right vertebral artery appears patent to the skull base without stenosis. Moderate atherosclerosis in the proximal left subclavian artery without significant stenosis. Left vertebral artery origin is spared. Left vertebral artery is fairly codominant and tortuous, patent to the V3 segment without stenosis. There is mild irregularity in the distal V3 segment and at the skull base without significant stenosis. CTA HEAD Posterior circulation: Tortuous distal vertebral arteries, the left is mildly dominant. Patent right PICA origin. Patent vertebrobasilar junction without stenosis. Patent basilar artery without stenosis. SCA and left PCA origin are patent. Fetal type right PCA origin is patent. Bilateral PCA branches are within normal limits. Anterior circulation: Both ICA siphons are patent. Minimal ICA siphon plaque and no siphon stenosis. Early cavernous sinus enhancement. Normal right posterior communicating artery origin. Patent carotid termini. Patent MCA and ACA origins. Diminutive anterior communicating artery. Bilateral ACA branches, left MCA M1 segment, left MCA bifurcation, and left MCA branches are within normal limits. Right MCA M1 segment and trifurcation are patent but there is a right MCA M2 branch occlusion on series 9, image 37, series 12, image 9 at the middle sylvian division. Mild irregularity of the other enhancing right MCA branches. Venous sinuses: Patent. Anatomic variants: Mildly dominant left vertebral artery V4 segment. Review of the MIP images confirms the above findings IMPRESSION: 1. Positive for ELVO: Right MCA M2 branch occlusion, middle Sylvian division. 2. CT Perfusion detects a an infarct core of 15 mL which appears fairly concordant to the ASPECTS of 7. However,  some of the 32 mL penumbra is artifactual (in the left temporal lobe). And Hypoperfusion Index is 0.6. 3. Right CCA and carotid bifurcation atherosclerosis without significant stenosis. Mild atherosclerosis elsewhere in the head and neck. Aortic Atherosclerosis (ICD10-I70.0). 4. Emphysema (ICD10-J43.9) and posttreatment changes to both visible lungs. Small but  increased anterior carina mediastinal lymph node since last year, with questionable increased right hilar nodal tissue raising the possibility of active right lung cancer. Small layering right pleural effusion. Salient findings were discussed with Dr. Rory Percy beginning at 0839 hours on 03/21/2021 by text messaging. Electronically Signed   By: Genevie Ann M.D.   On: 03/25/2021 09:01    Cardiac Studies   Echo    Oct 1 22   Left ventricular ejection fraction, by estimation, is 65 to 70%. The left ventricle has normal function. The left ventricle has no regional wall motion abnormalities. There is moderate left ventricular hypertrophy of the basal-septal segment. Left ventricular diastolic parameters are consistent with Grade I diastolic dysfunction (impaired relaxation). Elevated left ventricular end-diastolic pressure. 1. Right ventricular systolic function is normal. The right ventricular size is normal. There is normal pulmonary artery systolic pressure. 2. The mitral valve is degenerative. Mild mitral valve regurgitation. No evidence of mitral stenosis. 3. The aortic valve is normal in structure. Aortic valve regurgitation is mild. No aortic stenosis is present. 4. The inferior vena cava is normal in size with greater than 50% respiratory variability, suggesting right atrial pressure of 3 mmHg.    Patient Profile     86 y.o. female without a prior cardiac history who is being seen today for follow up of atrial fib NSTEMI.  Assessment & Plan    PAF in SR - on BB hyperdynamic EF.  No RWMA, moderate LVH. G1DD. Mild MVR, mild AI. On toprol  XL 12.5 mg daily    She does not need systemic anticoagulation given this provoked isolated even  NSTEMI with downtrending troponin.  Pk of 3493 given 3 vessel atherosclerosis, suspect she had out of hospital MI,  - no S/S of CHF - but silent as far as I can tell. Would continue aspirin 81 mg daily and Toprol XL 12.5 mg daily. Will stop atorvastatin as LDL 39 - overall cholesterol is quite low. As she remains chest pain free - would advocate medical management.  CAP per IM   Please see Dr. Alan Ripper note when I attempted to see pt no longer at [facility] bu no notes as to why.       For questions or updates, please contact [place] Please consult [email or web address] for contact info under        Signed, Cecilie Kicks, NP  04/06/2021, 9:04 AM

## 2021-03-20 NOTE — Progress Notes (Signed)
Pt remains intubated and sedated, under the care of anesthesia.

## 2021-03-20 NOTE — Progress Notes (Addendum)
Pt remains intubated and sedated, under the care of anesthesia.

## 2021-03-20 NOTE — Progress Notes (Signed)
Patient noted to be lying on side at 7:30 this morning attempted to wake the patient up. Patient did wake up, but when asked to tell me name and date of birth patient was noted to be unresponsive and unable to answer questions. Charge nurse notified. MD paged. Rapid response called. Left facial droop, decreased sensation, and weakness to the left upper and lower extremities noted.See new orders.

## 2021-03-20 NOTE — Consult Note (Signed)
[facility] Telemedicine Consult  Requesting Provider: Dr Grandville Silos, [facility] Consult Participants: Dr. Jerelyn Charles, Telespecialist RN Leana Roe   Bedside RN Mel Almond Location of the provider: [facility] Location of the patient: [id number] and CT scanner at [facility]  This consult was provided via telemedicine with 2-way video and audio communication. The patient/family was informed that care would be provided in this way and agreed to receive care in this manner.    Chief Complaint: left sided weakness, left neglect, left facial droop, right gaze deviation  HPI: 86/F with PMH as below who is being treated at [facility] for new onset paroxysmal Afib not on AC, NSTEMI,  CAP vs Aspitration pna, dehydration and toxic metabolic derangements, who is normal AAOx4 per RN, was noted this AM to be having difficutly with speech, left sided weakness and neglect, left facial droop and right gaze preference with inability to cross midline to left. Code stroke was activated. LKW was last night at 2100 when she went to bed and upon awakening this AM, she was noted to be waking with these symptoms. Per patient daughter-Michelle-she is living with the daughter, quite independent at baseline-is what I was told initially but later on she said that she has been requiring more help as the time has gone on.  Her lung cancer has been stable-she has been remission for the past 3 years and walks with a cane and sometimes with a walker.     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   Esophageal polyp    GERD (gastroesophageal reflux disease)    Glaucoma    Hard of hearing    History of hiatal hernia    Lung cancer, upper lobe, left 2011   adenocarcinoma resected    Metastasis to adrenal gland ([place]) dx'd 1994, 2011   1994 left, 2011 right,  back tumor 2015   Osteoporosis    S/P radiation therapy 9/26-16-03/20/15   SBRT LUL 54 Gy/17fx   Urinary frequency    Wears glasses      Current Facility-Administered Medications:     stroke: mapping our early stages of recovery book, , Does not apply, Once, Eugenie Filler, MD   acetaminophen (TYLENOL) tablet 650 mg, 650 mg, Oral, Q6H PRN, 650 mg at 04/03/2021 0214 **OR** acetaminophen (TYLENOL) suppository 650 mg, 650 mg, Rectal, Q6H PRN, Eugenie Filler, MD   acetaminophen (TYLENOL) tablet 500 mg, 500 mg, Oral, TID, Eugenie Filler, MD, 500 mg at 03/19/21 2107   amoxicillin-clavulanate (AUGMENTIN) 875-125 MG per tablet 1 tablet, 1 tablet, Oral, Q12H, Eugenie Filler, MD, 1 tablet at 03/19/21 2108   aspirin EC tablet 81 mg, 81 mg, Oral, Daily, Eugenie Filler, MD, 81 mg at 03/19/21 1029   Chlorhexidine Gluconate Cloth 2 % PADS 6 each, 6 each, Topical, Daily, Eugenie Filler, MD, 6 each at 03/19/21 1031   enoxaparin (LOVENOX) injection 30 mg, 30 mg, Subcutaneous, Q24H, Eugenie Filler, MD, 30 mg at 03/19/21 1148   feeding supplement (ENSURE ENLIVE / ENSURE PLUS) liquid 237 mL, 237 mL, Oral, TID BM, Eugenie Filler, MD, 237 mL at 03/19/21 1031   fludrocortisone (FLORINEF) tablet 0.1 mg, 0.1 mg, Oral, Q24H, Eugenie Filler, MD, 0.1 mg at  03/31/2021 0747   gabapentin (NEURONTIN) capsule 300 mg, 300 mg, Oral, QHS, Eugenie Filler, MD, 300 mg at 03/19/21 2108   gabapentin (NEURONTIN) capsule 300 mg, 300 mg, Oral, Daily PRN, Eugenie Filler, MD   guaiFENesin [facility]) 12 hr tablet 1,200 mg, 1,200 mg, Oral, BID, Eugenie Filler, MD, 1,200 mg at 03/19/21 2109   guaiFENesin (ROBITUSSIN) 100 MG/5ML solution 100 mg, 5 mL, Oral, Q4H PRN, Eugenie Filler, MD   hydrocortisone (CORTEF) tablet 10 mg, 10 mg, Oral, Q24H, Eugenie Filler, MD, 10 mg at 03/18/2021 0746   hydrocortisone (CORTEF) tablet 5 mg, 5 mg, Oral, Q24H, Lenis Noon, RPH, 5 mg at 03/19/21 1149    ipratropium (ATROVENT) nebulizer solution 0.5 mg, 0.5 mg, Nebulization, Q6H PRN, Eugenie Filler, MD   ketorolac (TORADOL) 30 MG/ML injection 15 mg, 15 mg, Intravenous, Q6H PRN, Eugenie Filler, MD, 15 mg at 03/18/21 1829   latanoprost (XALATAN) 0.005 % ophthalmic solution 1 drop, 1 drop, Both Eyes, QHS, Eugenie Filler, MD, 1 drop at 03/19/21 2110   levalbuterol (XOPENEX) nebulizer solution 0.63 mg, 0.63 mg, Nebulization, Q6H PRN, Eugenie Filler, MD   loperamide (IMODIUM) capsule 2 mg, 2 mg, Oral, PRN, Eugenie Filler, MD   menthol-cetylpyridinium (CEPACOL) lozenge 3 mg, 1 lozenge, Oral, PRN, Eugenie Filler, MD   metoprolol succinate (TOPROL-XL) 24 hr tablet 12.5 mg, 12.5 mg, Oral, Daily, Hilty, Nadean Corwin, MD, 12.5 mg at 03/19/21 1030   multivitamin with minerals tablet 1 tablet, 1 tablet, Oral, Daily, Eugenie Filler, MD, 1 tablet at 03/19/21 1029   pantoprazole (PROTONIX) EC tablet 40 mg, 40 mg, Oral, Daily, Nyoka Cowden, Terri L, RPH, 40 mg at 03/19/21 1029   phenazopyridine (PYRIDIUM) tablet 100 mg, 100 mg, Oral, TID WC, Eugenie Filler, MD, 100 mg at 03/18/2021 0747   polyethylene glycol (MIRALAX / GLYCOLAX) packet 17 g, 17 g, Oral, Daily PRN, Eugenie Filler, MD, 17 g at 03/17/21 1000   potassium chloride 10 mEq in 100 mL IVPB, 10 mEq, Intravenous, Q1 Hr x 4, Eugenie Filler, MD   prochlorperazine (COMPAZINE) injection 10 mg, 10 mg, Intravenous, Q6H PRN, Eugenie Filler, MD, 10 mg at 03/18/21 1611   QUEtiapine (SEROQUEL) tablet 25 mg, 25 mg, Oral, QHS PRN, Eugenie Filler, MD, 25 mg at 03/19/21 2110   sodium chloride flush (NS) 0.9 % injection 3 mL, 3 mL, Intravenous, Q12H, Eugenie Filler, MD, 3 mL at 03/19/21 2110   sorbitol 70 % solution 30 mL, 30 mL, Oral, Daily PRN, Eugenie Filler, MD, 30 mL at 03/17/21 1107    LKW: 9 PM 03/19/2021 tpa given?: No, outside the window IR Thrombectomy?  Yes Modified Rankin-2-3 Time of teleneurologist evaluation: 8:10  AM  Exam: Vitals:   03/19/21 1955 04/04/2021 0359  BP: 130/90 (!) 149/64  Pulse: 88 93  Resp: 18 20  Temp: 98.1 F (36.7 C) 97.8 F (36.6 C)  SpO2: 93% 96%    General: Awake alert in no acute distress Neurological exam She is awake, alert, follows simple commands.  Her speech is extremely dysarthric. Naming comprehension and repetition is impaired Cranial nerves: Pupils are equal round reactive to light, she has rightward gaze preference and is unable to come all the way to the midline and absolutely not able to look to the left, does not bring to the tract from the left, blinks to threat from the right, left lower facial weakness. Motor examination: Left upper extremity  barely 3/5, left lower extremity 4 -/5.  Right side full strength Sensation: Intact on the right, neglects to double simultaneous stimulation on the left.    NIHSS 1A: Level of Consciousness - 0 1B: Ask Month and Age - 2 1C: 'Blink Eyes' & 'Squeeze Hands' - 0 2: Test Horizontal Extraocular Movements - 2 3: Test Visual Fields - 2 4: Test Facial Palsy - 2 5A: Test Left Arm Motor Drift - 2 5B: Test Right Arm Motor Drift - 0 6A: Test Left Leg Motor Drift - 1 6B: Test Right Leg Motor Drift - 0 7: Test Limb Ataxia - 0 8: Test Sensation - 1 9: Test Language/Aphasia- 2 10: Test Dysarthria - 2 11: Test Extinction/Inattention - 2 NIHSS score: 18   Imaging Reviewed: CT head with right MCA evolving infarction with aspects 10.  No bleed.  CTA head and neck with r distal right M1/proximal right M2 occlusion CT perfusion with infarct core 15 cc which is somewhat concordant to the aspects of 7 however 32 cc T-max greater than 6  Labs reviewed in epic and pertinent values follow: CBC    Component Value Date/Time   WBC 9.1 03/24/2021 0456   RBC 2.74 (L) 03/19/2021 0456   HGB 10.1 (L) 03/28/2021 0456   HGB 13.2 01/25/2015 0753   HCT 29.2 (L) 04/08/2021 0456   HCT 38.8 01/25/2015 0753   PLT 130 (L) 03/29/2021 0456    PLT 218 01/25/2015 0753   MCV 106.6 (H) 03/30/2021 0456   MCV 97.5 01/25/2015 0753   MCH 36.9 (H) 03/27/2021 0456   MCHC 34.6 03/24/2021 0456   RDW 13.2 04/02/2021 0456   RDW 13.3 01/25/2015 0753   LYMPHSABS 1.5 04/07/2021 0456   LYMPHSABS 1.5 01/25/2015 0753   MONOABS 0.7 03/29/2021 0456   MONOABS 0.4 01/25/2015 0753   EOSABS 0.3 04/02/2021 0456   EOSABS 0.1 01/25/2015 0753   BASOSABS 0.1 04/15/2021 0456   BASOSABS 0.0 01/25/2015 0753   CMP     Component Value Date/Time   NA 141 04/13/2021 0456   NA 143 08/17/2015 1438   K 3.2 (L) 03/31/2021 0456   K 3.9 08/17/2015 1438   CL 110 03/21/2021 0456   CL 105 09/01/2012 1111   CO2 26 04/11/2021 0456   CO2 26 08/17/2015 1438   GLUCOSE 81 03/31/2021 0456   GLUCOSE 87 08/17/2015 1438   GLUCOSE 93 09/01/2012 1111   BUN 11 04/12/2021 0456   BUN 21.9 08/17/2015 1438   CREATININE 0.50 04/10/2021 0456   CREATININE 0.7 08/17/2015 1438   CALCIUM 8.2 (L) 04/06/2021 0456   CALCIUM 9.4 08/17/2015 1438   PROT 6.2 (L) 02/19/2021 1416   PROT 6.1 (L) 01/25/2015 0754   ALBUMIN 2.9 (L) 03/15/2021 1416   ALBUMIN 3.3 (L) 01/25/2015 0754   AST 36 03/05/2021 1416   AST 17 01/25/2015 0754   ALT 21 02/22/2021 1416   ALT 19 01/25/2015 0754   ALKPHOS 48 02/25/2021 1416   ALKPHOS 40 01/25/2015 0754   BILITOT 1.2 03/02/2021 1416   BILITOT 0.99 01/25/2015 0754   GFRNONAA >60 03/23/2021 0456   GFRAA >60 02/03/2015 1044     Assessment: 86 year old with above past medical history with sudden onset of left-sided weakness, left facial droop, right gaze preference and left hemianopsia-consistent with a right MCA infarct. New onset atrial fibrillation during this admission-and history of lung cancer-likely etiology of stroke hypercoagulability versus cardioembolic. Had a detailed discussion with the daughter regarding treatment options.  Outside the  window for IV TNKase due to the time. Discussed the possibility of thrombectomy.  Initial history was  suggestive of a modified Rankin of about a 2 but more that we delved into the history in detail, the patient has been deteriorating over the past few months.  Also noted on the CT angio of the head and neck is that her lung cancer might be progressed in the right lung. That said, we did still have a discussion about thrombectomy with the daughter explaining to her that as the modified Rankin score gets higher, the risk of complications and the chances of meaningful recovery become lower.  The daughter would like to pursue with the thrombectomy to give her mother the best chance of what ever recovery she can possibly have at this time. Plan was discussed on a three-way call with the daughter and interventionalist Dr. Estanislado Pandy and she consented to the procedure after understanding the risks and benefits.  Recommendations:  Emergent transfer to [facility] for emergent thrombectomy Admission to the stroke service after thrombectomy for post thrombectomy care Patient service can be turned over to the hospitalist service once she is stabilized after thrombectomy Post stroke care per stroke team and neuro interventional radiology team at [place] discussed in detail with the hospitalist Dr. Grandville Silos at [facility], neuro interventional list Dr. Estanislado Pandy at [facility] and neuro hospitalist Dr. Curly Shores at [place] was also discussed in detail with Lorin Mercy, patient's daughter over the phone. This patient is receiving care for possible acute neurological changes. There was 70 minutes of care by this provider at the time of service, including time for direct evaluation via telemedicine, review of medical records, imaging studies and discussion of findings with providers, the patient and/or family.  -- Amie Portland, MD [facility] Pager: [phone] If 7pm to 7am, please call on call as listed on AMION.   CRITICAL CARE  ATTESTATION Performed by: Amie Portland, MD Total critical care time: 70 minutes Critical care time was exclusive of separately billable procedures and treating other patients and/or supervising APPs/Residents/Students Critical care was necessary to treat or prevent imminent or life-threatening deterioration due to acute ischemic stroke requiring thrombectomy. This patient is critically ill and at significant risk for neurological worsening and/or death and care requires constant monitoring. Critical care was time spent personally by me on the following activities: development of treatment plan with patient and/or surrogate as well as nursing, discussions with consultants, evaluation of patient's response to treatment, examination of patient, obtaining history from patient or surrogate, ordering and performing treatments and interventions, ordering and review of laboratory studies, ordering and review of radiographic studies, pulse oximetry, re-evaluation of patient's condition, participation in multidisciplinary rounds and medical decision making of high complexity in the care of this patient.

## 2021-03-20 NOTE — Progress Notes (Signed)
Report called to [place]ocks RN and also given at bedside. Transported pt to 4N with CRNA and this RN. Pt vitals stable at handoff and right groin site level 0.

## 2021-03-20 NOTE — Transfer of Care (Signed)
Immediate Anesthesia Transfer of Care Note  Patient: Kassity Cortopassi  Procedure(s) Performed: IR WITH ANESTHESIA  Patient Location: ICU  Anesthesia Type:General  Level of Consciousness: sedated and Patient remains intubated per anesthesia plan  Airway & Oxygen Therapy: Patient remains intubated per anesthesia plan and Patient placed on Ventilator (see vital sign flow sheet for setting)  Post-op Assessment: Report given to RN and Post -op Vital signs reviewed and stable  Post vital signs: Reviewed and stable  Last Vitals:  Vitals Value Taken Time  BP 147/63   Temp    Pulse 62   Resp 12   SpO2 100     Last Pain:  Vitals:   03/19/2021 0359  TempSrc: Axillary  PainSc:          Complications: No notable events documented.

## 2021-03-20 NOTE — Consult Note (Addendum)
NAME:  Leslie Petersen, MRN:  [id number], DOB:  Dec 20, 1934, LOS: 4 ADMISSION DATE:  02/17/2021, CONSULTATION DATE: 04/07/2021 REFERRING MD: Interventional neurologist, CHIEF COMPLAINT: Code stroke with a right MCA clot retrieval on 04/13/2021  History of Present Illness:  87 year old female who is listed as a DNR and was admitted to [facility] long hospital 930 for suspected community-acquired pneumonia treated Unasyn.  Had a code stroke called 03/29/2021 and was transferred from [facility] to [facility] with urgent right MCA clot retrieval per neurological intervention team.  She is remaing intubated overnight and will be evaluated in a.m. for possible extubation note she has extensive past medical history is well-documented below she currently has advanced cortical tumors which have been resected adenocarcinoma of the lung resected she is followed by Dr. Geanie Cooley of oncology and is currently in remission to be followed up next month December 2020.  Pulmonary critical care team to follow-up with vent management.  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   Esophageal polyp    GERD (gastroesophageal reflux disease)    Glaucoma    Hard of hearing    History of hiatal hernia    Lung cancer, upper lobe, left 2011   adenocarcinoma resected    Metastasis to adrenal gland ([place]) dx'd 1994, 2011   1994 left, 2011 right, back tumor 2015   Osteoporosis    S/P radiation therapy 9/26-16-03/20/15   SBRT LUL 54 Gy/68fx   Urinary frequency    Wears glasses      Significant Hospital Events: Including procedures, antibiotic start and stop dates in addition to other pertinent events   03/17/2021 right MCA clot retrieval  Interim History / Subjective:  Transferred to  [facility] for was a long hospital for emergent right MCA clot retrieval  Objective   Blood pressure (!) 133/58, pulse 72, temperature 97.8 F (36.6 C), temperature source Axillary, resp. rate 12, height 5\' 4"  (1.626 m), weight 52.2 kg, SpO2 100 %.    Vent Mode: PRVC FiO2 (%):  [50 %] 50 % Set Rate:  [12 bmp] 12 bmp Vt Set:  [430 mL] 430 mL PEEP:  [5 cmH20] 5 cmH20 Plateau Pressure:  [12 cmH20] 12 cmH20   Intake/Output Summary (Last 24 hours) at 04/10/2021 1153 Last data filed at 04/15/2021 1123 Gross per 24 hour  Intake 614.49 ml  Output 625 ml  Net -10.51 ml   Filed Weights   03/19/21 1955 03/25/2021 0500 03/27/2021 1145  Weight: 58.3 kg 58.3 kg 52.2 kg    Examination: General: Elderly female on full mechanical ventilatory support sedated with propofol HENT: No JVD or lymphadenopathy is appreciated Lungs: Diminished in the bases Cardiovascular: Currently in sinus rhythm heart sounds regular Abdomen: Soft nontender positive bowel sounds Extremities: Multiple areas of ecchymosis on extremities Neuro: Sedated with propofol GU: Amber urine  Resolved Hospital Problem list     Assessment & Plan:  Altered mental status in the setting of right MCA clot retrieval performed on 03/21/2019 neurological interventionist.  Pulmonary critical care asked to consult due to vent management overnight. ABGs Portable chest x-ray Vent bundle Ventilator changes as needed Maintain systolic blood pressure between 120 and 140 Assess  for possible extubation in a.m. 03/21/2021  Status post adrenal cortical tumor postresection adenocarcinoma lung with wedge resection currently admission and is followed by oncology is due for follow-up in 05/17/2021 Oncology consult in future  Community-acquired pneumonia with admission to [facility] long hospital on 03/02/2021 treated with Unasyn.  She was transferred to [facility] for neurological intervention. Continue antimicrobial therapy Serial chest x-ray This  may impede weaning Sputum culture since she is now intubated  Paroxysmal atrial fibrillation with ejection fraction greater than 65%. No anticoagulation at this time Cardiac monitoring Cardiology has been following  Renal insuff Lab Results  Component Value Date   CREATININE 0.50 04/16/2021   CREATININE 0.62 03/19/2021   CREATININE 0.54 03/18/2021   CREATININE 0.7 08/17/2015   CREATININE 0.7 01/25/2015   CREATININE 0.6 09/28/2014  Monitor  Adrenal insuff Check cortisol   Best Practice (right click and "Reselect all SmartList Selections" daily)   Diet/type: NPO DVT prophylaxis: not indicated GI prophylaxis: PPI Lines: N/A Foley:  N/A Code Status:  DNR Last date of multidisciplinary goals of care discussion t Labs   CBC: Recent Labs  Lab 02/25/2021 1416 03/17/21 0312 03/18/21 0318 03/19/21 0315 03/18/2021 0456  WBC 8.4 6.4 6.1 6.4 9.1  NEUTROABS 4.9 4.4 4.0  --  6.5  HGB 10.6* 9.8* 10.0* 9.2* 10.1*  HCT 32.7* 30.0* 29.5* 27.8* 29.2*  MCV 112.0* 110.7* 109.3* 109.9* 106.6*  PLT 147* 114* 119* 123* 130*    Basic Metabolic Panel: Recent Labs  Lab 03/03/2021 1416 03/02/2021 1915 03/17/21 0312 03/18/21 0318 03/19/21 0315 04/16/2021 0456  NA 145  --  141 145 142 141  K 3.2*  --  4.1 3.1* 3.5 3.2*  CL 110  --  112* 114* 111 110  CO2 26  --  25 22 25 26   GLUCOSE 115*  --  87 82 99 81  BUN 15  --  14 12 14 11   CREATININE 0.74  --  0.58 0.54 0.62 0.50  CALCIUM 8.7*  --  7.7* 8.2* 8.1* 8.2*  MG  --  1.7 2.3 2.0 2.2  --    GFR: Estimated Creatinine Clearance: 43.9 mL/min (by C-G formula based on SCr of 0.5 mg/dL). Recent Labs  Lab 02/18/2021 1416 02/19/2021 1549 03/07/2021 1915 03/17/21 0312 03/18/21 0318 03/19/21 0315 04/16/2021 0456  PROCALCITON  --   --  <0.10 <0.10  --   --   --   WBC 8.4  --   --  6.4 6.1 6.4 9.1  LATICACIDVEN 2.4* 1.3  --  1.0  --   --   --     Liver Function Tests: Recent Labs  Lab 03/03/2021 1416  AST 36  ALT 21  ALKPHOS 48  BILITOT  1.2  PROT 6.2*  ALBUMIN 2.9*   No results for input(s): LIPASE, AMYLASE in the last 168 hours. No results for input(s): AMMONIA in the last 168 hours.  ABG    Component Value Date/Time   PHART 7.441 (H) 08/16/2009 0443   PCO2ART 35.6 08/16/2009 0443   PO2ART 69.0 (L) 08/16/2009 0443   HCO3 24.3 (H) 08/16/2009 0443   TCO2 21 06/12/2014 1148   O2SAT 95.0 08/16/2009 0443     Coagulation Profile: Recent Labs  Lab 02/25/2021 1416  INR 1.3*    Cardiac Enzymes: No results for input(s): CKTOTAL, CKMB, CKMBINDEX, TROPONINI in the last 168 hours.  HbA1C: Hgb A1c MFr Bld  Date/Time Value Ref Range Status  04/12/2021 04:56 AM 5.1 4.8 - 5.6 %  Final    Comment:    (NOTE) Pre diabetes:          5.7%-6.4%  Diabetes:              >6.4%  Glycemic control for   <7.0% adults with diabetes     CBG: Recent Labs  Lab 04/11/2021 0759 04/12/2021 0930  GLUCAP 75 62*    Review of Systems:   na  Past Medical History:  She,  has a past medical history of Adenomatous colon polyp, Adrenal cortical tumor (2011), Allergy, Arthritis, Cataract, Constipation, Dry skin, Duodenal adenoma (08/29/2014), Esophageal polyp, GERD (gastroesophageal reflux disease), Glaucoma, Hard of hearing, History of hiatal hernia, Lung cancer, upper lobe, left (2011), Metastasis to adrenal gland ([place]) (dx'd 1994, 2011), Osteoporosis, S/P radiation therapy (9/26-16-03/20/15), Urinary frequency, and Wears glasses.   Surgical History:   Past Surgical History:  Procedure Laterality Date   ADRENAL GLAND SURGERY  1990/2011   CATARACT EXTRACTION, BILATERAL Bilateral    COLONOSCOPY  multiple   ESOPHAGOGASTRODUODENOSCOPY     ESOPHAGOGASTRODUODENOSCOPY N/A 09/16/2014   Procedure: ESOPHAGOGASTRODUODENOSCOPY (EGD);  Surgeon: [name], MD;  Location: [facility];  Service: Endoscopy;  Laterality: N/A;   EYE SURGERY     HOT HEMOSTASIS N/A 09/16/2014   Procedure: HOT HEMOSTASIS (ARGON PLASMA COAGULATION/BICAP);  Surgeon:  [name], MD;  Location: [facility];  Service: Endoscopy;  Laterality: N/A;   LUNG CANCER SURGERY  08/2009   malignant tumor from back  June 2015   at [facility] "related to adrenal gland cancer" per pt   TUBAL LIGATION     [place] N/A 02/06/2015   Procedure: VIDEO BRONCHOSCOPY WITH ENDOBRONCHIAL NAVIGATION;  Surgeon: Grace Isaac, MD;  Location: [place];  Service: Thoracic;  Laterality: N/A;   VIDEO BRONCHOSCOPY WITH ENDOBRONCHIAL ULTRASOUND N/A 10/18/2014   Procedure: VIDEO BRONCHOSCOPY WITH ENDOBRONCHIAL ULTRASOUND;  Surgeon: Grace Isaac, MD;  Location: [place];  Service: Thoracic;  Laterality: N/A;   VIDEO BRONCHOSCOPY WITH ENDOBRONCHIAL ULTRASOUND N/A 02/06/2015   Procedure: VIDEO BRONCHOSCOPY WITH ENDOBRONCHIAL ULTRASOUND;  Surgeon: Grace Isaac, MD;  Location: [place];  Service: Thoracic;  Laterality: N/A;     Social History:   reports that she quit smoking about 32 years ago. She has never used smokeless tobacco. She reports that she does not drink alcohol and does not use drugs.   Family History:  Her family history includes Heart attack in her father and mother.   Allergies Allergies  Allergen Reactions   Mirabegron Other (See Comments)    Patient had adverse effect to medication - doesn't recall reaction    Urabeth [Bethanechol] Other (See Comments)    Pt doesn't remember the reaction     Home Medications  Prior to Admission medications   Medication Sig Start Date End Date Taking? Authorizing Provider  atorvastatin (LIPITOR) 10 MG tablet Take 10 mg by mouth at bedtime. 04/27/17  Yes [provider]  Cholecalciferol (VITAMIN D3) 1000 UNITS CAPS Take 1,000 Units by mouth daily.    Yes [provider]  fludrocortisone (FLORINEF) 0.1 MG tablet Take 0.1 mg by mouth daily. At 8:00 am 09/12/11  Yes [provider]  gabapentin (NEURONTIN) 300 MG capsule Take one pill tid Patient taking differently: Take  300 mg by mouth as directed. Take 1 capsule (300 mg) at bedtime and Take 1 capsule (300 mg) Daily PRN Neuropathy 08/22/16  Yes Milton Ferguson, MD  hydrocortisone (CORTEF) 10 MG tablet Take 5-10 mg  by mouth as directed. Take 10 mg tablet by mouth at 8:00 am 5 mg tablet at noon 09/12/11  Yes [provider]  latanoprost (XALATAN) 0.005 % ophthalmic solution Place 1 drop into both eyes at bedtime. 04/09/20  Yes [provider]  Multiple Vitamins-Minerals (MULTIVITAMIN WITH MINERALS) tablet Take 1 tablet by mouth daily after lunch.    Yes [provider]  olmesartan (BENICAR) 40 MG tablet Take 40 mg by mouth daily. 04/19/20  Yes [provider]  Pseudoephedrine HCl (SINUS 12 HOUR PO) Take 1 tablet by mouth daily.   Yes [provider]     Critical care time: 35 min    Richardson Landry Vivek Grealish ACNP Acute Care Nurse Practitioner [place] Please consult Amion 04/01/2021, 11:54 AM

## 2021-03-20 NOTE — Anesthesia Procedure Notes (Signed)
Procedure Name: Intubation Date/Time: 04/14/2021 9:44 AM Performed by: Carolan Clines, CRNA Pre-anesthesia Checklist: Patient identified, Emergency Drugs available, Suction available and Patient being monitored Patient Re-evaluated:Patient Re-evaluated prior to induction Oxygen Delivery Method: Circle System Utilized Preoxygenation: Pre-oxygenation with 100% oxygen Induction Type: IV induction, Rapid sequence and Cricoid Pressure applied Laryngoscope Size: Mac and 3 Grade View: Grade I Tube type: Oral Tube size: 7.5 mm Number of attempts: 1 Airway Equipment and Method: Stylet Placement Confirmation: ETT inserted through vocal cords under direct vision, positive ETCO2 and breath sounds checked- equal and bilateral Secured at: 22 cm Tube secured with: Tape Dental Injury: Teeth and Oropharynx as per pre-operative assessment

## 2021-03-20 NOTE — Progress Notes (Signed)
RT attempted to get culture at this time not able to obtain anything to send off. RT will continue to monitor.

## 2021-03-20 NOTE — Plan of Care (Signed)
At around 6 PM updated family at bedside, who are concerned about patient's discomfort on breathing tube.  Discussed with CCM sedation/pressor management with suggestion that they may change from phenylephrine to Levophed.  Per nursing patient has been moving the right upper and lower extremity well as well as the left lower extremity, though she does have some notable weakness on the left upper extremity, it is improved from my initial examination.  Discussed that given the patient's significant difficulty with communication and understanding commands, tenuous cardiopulmonary status and recent procedure it was felt to be safest to continue intubational for now with plan to wean ventilator in the morning after she has had some time to recover from the acute event.  Unfortunately I was paged multiple times about other acute events and patient care issues while attempting to talk to family and therefore my exam and discussion was limited  Lesleigh Noe MD-PhD [facility] [phone] Available 7 AM to 7 PM, outside these hours please contact Neurologist on call listed on AMION

## 2021-03-21 ENCOUNTER — Encounter (HOSPITAL_COMMUNITY): Payer: Self-pay | Admitting: Radiology

## 2021-03-21 ENCOUNTER — Inpatient Hospital Stay (HOSPITAL_COMMUNITY): Payer: Medicare Other

## 2021-03-21 DIAGNOSIS — I63511 Cerebral infarction due to unspecified occlusion or stenosis of right middle cerebral artery: Secondary | ICD-10-CM | POA: Diagnosis not present

## 2021-03-21 DIAGNOSIS — I959 Hypotension, unspecified: Secondary | ICD-10-CM | POA: Diagnosis not present

## 2021-03-21 DIAGNOSIS — I63411 Cerebral infarction due to embolism of right middle cerebral artery: Secondary | ICD-10-CM

## 2021-03-21 DIAGNOSIS — C7400 Malignant neoplasm of cortex of unspecified adrenal gland: Secondary | ICD-10-CM | POA: Diagnosis not present

## 2021-03-21 DIAGNOSIS — I4891 Unspecified atrial fibrillation: Secondary | ICD-10-CM | POA: Diagnosis not present

## 2021-03-21 DIAGNOSIS — I480 Paroxysmal atrial fibrillation: Secondary | ICD-10-CM | POA: Diagnosis not present

## 2021-03-21 DIAGNOSIS — J189 Pneumonia, unspecified organism: Secondary | ICD-10-CM | POA: Diagnosis not present

## 2021-03-21 LAB — MAGNESIUM: Magnesium: 1.4 mg/dL — ABNORMAL LOW (ref 1.7–2.4)

## 2021-03-21 LAB — CBC
HCT: 25.9 % — ABNORMAL LOW (ref 36.0–46.0)
Hemoglobin: 8.4 g/dL — ABNORMAL LOW (ref 12.0–15.0)
MCH: 35.7 pg — ABNORMAL HIGH (ref 26.0–34.0)
MCHC: 32.4 g/dL (ref 30.0–36.0)
MCV: 110.2 fL — ABNORMAL HIGH (ref 80.0–100.0)
Platelets: 187 10*3/uL (ref 150–400)
RBC: 2.35 MIL/uL — ABNORMAL LOW (ref 3.87–5.11)
RDW: 13.9 % (ref 11.5–15.5)
WBC: 13.8 10*3/uL — ABNORMAL HIGH (ref 4.0–10.5)
nRBC: 0.9 % — ABNORMAL HIGH (ref 0.0–0.2)

## 2021-03-21 LAB — BASIC METABOLIC PANEL
Anion gap: 11 (ref 5–15)
BUN: 9 mg/dL (ref 8–23)
CO2: 19 mmol/L — ABNORMAL LOW (ref 22–32)
Calcium: 7 mg/dL — ABNORMAL LOW (ref 8.9–10.3)
Chloride: 110 mmol/L (ref 98–111)
Creatinine, Ser: 0.8 mg/dL (ref 0.44–1.00)
GFR, Estimated: >60 mL/min (ref >60)
Glucose, Bld: 139 mg/dL — ABNORMAL HIGH (ref 70–99)
Potassium: 3.1 mmol/L — ABNORMAL LOW (ref 3.5–5.1)
Sodium: 140 mmol/L (ref 135–145)

## 2021-03-21 LAB — POCT I-STAT 7, (LYTES, BLD GAS, ICA,H+H)
Acid-base deficit: 2 mmol/L (ref 0.0–2.0)
Bicarbonate: 21.5 mmol/L (ref 20.0–28.0)
Calcium, Ion: 0.95 mmol/L — ABNORMAL LOW (ref 1.15–1.40)
HCT: 23 % — ABNORMAL LOW (ref 36.0–46.0)
Hemoglobin: 7.8 g/dL — ABNORMAL LOW (ref 12.0–15.0)
O2 Saturation: 100 %
Patient temperature: 99.3
Potassium: 3 mmol/L — ABNORMAL LOW (ref 3.5–5.1)
Sodium: 144 mmol/L (ref 135–145)
TCO2: 23 mmol/L (ref 22–32)
pCO2 arterial: 32.6 mmHg (ref 32.0–48.0)
pH, Arterial: 7.429 (ref 7.350–7.450)
pO2, Arterial: 169 mmHg — ABNORMAL HIGH (ref 83.0–108.0)

## 2021-03-21 LAB — CULTURE, BLOOD (ROUTINE X 2)
Culture: NO GROWTH
Culture: NO GROWTH
Special Requests: ADEQUATE
Special Requests: ADEQUATE

## 2021-03-21 LAB — MRSA NEXT GEN BY PCR, NASAL: MRSA by PCR Next Gen: NOT DETECTED

## 2021-03-21 LAB — GLUCOSE, CAPILLARY
Glucose-Capillary: 145 mg/dL — ABNORMAL HIGH (ref 70–99)
Glucose-Capillary: 146 mg/dL — ABNORMAL HIGH (ref 70–99)

## 2021-03-21 LAB — CULTURE, BLOOD (SINGLE): Culture: NO GROWTH

## 2021-03-21 LAB — PHOSPHORUS: Phosphorus: 4.3 mg/dL (ref 2.5–4.6)

## 2021-03-21 MED ORDER — ONDANSETRON HCL 4 MG/2ML IJ SOLN: 4.0000 mg | Freq: Four times a day (QID) | INTRAMUSCULAR | Status: DC | PRN

## 2021-03-21 MED ORDER — GLYCOPYRROLATE 1 MG PO TABS
1.0000 mg | ORAL_TABLET | ORAL | Status: DC | PRN
  Filled 2021-03-21: qty 1

## 2021-03-21 MED ORDER — AMIODARONE IV BOLUS ONLY 150 MG/100ML
150.0000 mg | Freq: Once | INTRAVENOUS | Status: AC
  Administered 2021-03-21: 150 mg via INTRAVENOUS
  Filled 2021-03-21: qty 100

## 2021-03-21 MED ORDER — ONDANSETRON 4 MG PO TBDP: 4.0000 mg | ORAL_TABLET | Freq: Four times a day (QID) | ORAL | Status: DC | PRN

## 2021-03-21 MED ORDER — GABAPENTIN 300 MG PO CAPS: 300.0000 mg | ORAL_CAPSULE | Freq: Every day | ORAL | Status: DC

## 2021-03-21 MED ORDER — FENTANYL CITRATE PF 50 MCG/ML IJ SOSY
50.0000 ug | PREFILLED_SYRINGE | INTRAMUSCULAR | Status: DC | PRN
  Administered 2021-03-21: 50 ug via INTRAVENOUS
  Filled 2021-03-21: qty 1

## 2021-03-21 MED ORDER — MAGNESIUM SULFATE 4 GM/100ML IV SOLN
4.0000 g | Freq: Once | INTRAVENOUS | Status: AC
  Administered 2021-03-21: 4 g via INTRAVENOUS
  Filled 2021-03-21: qty 100

## 2021-03-21 MED ORDER — POLYVINYL ALCOHOL 1.4 % OP SOLN
1.0000 [drp] | Freq: Four times a day (QID) | OPHTHALMIC | Status: DC | PRN
  Filled 2021-03-21: qty 15

## 2021-03-21 MED ORDER — HALOPERIDOL 0.5 MG PO TABS
0.5000 mg | ORAL_TABLET | ORAL | Status: DC | PRN
  Filled 2021-03-21: qty 1

## 2021-03-21 MED ORDER — GLYCOPYRROLATE 0.2 MG/ML IJ SOLN: 0.2000 mg | INTRAMUSCULAR | Status: DC | PRN

## 2021-03-21 MED ORDER — GUAIFENESIN 100 MG/5ML PO SOLN: 20.0000 mL | ORAL | Status: DC

## 2021-03-21 MED ORDER — HALOPERIDOL LACTATE 5 MG/ML IJ SOLN
0.5000 mg | INTRAMUSCULAR | Status: DC | PRN
  Administered 2021-03-21: 0.5 mg via INTRAVENOUS
  Filled 2021-03-21: qty 1

## 2021-03-21 MED ORDER — BIOTENE DRY MOUTH MT LIQD: 15.0000 mL | OROMUCOSAL | Status: DC | PRN

## 2021-03-21 MED ORDER — MORPHINE 100MG IN NS 100ML (1MG/ML) PREMIX INFUSION
1.0000 mg/h | INTRAVENOUS | Status: DC
  Administered 2021-03-21: 1 mg/h via INTRAVENOUS
  Filled 2021-03-21: qty 100

## 2021-03-21 MED ORDER — SCOPOLAMINE 1 MG/3DAYS TD PT72: 1.0000 | MEDICATED_PATCH | TRANSDERMAL | Status: DC

## 2021-03-21 MED ORDER — POTASSIUM CHLORIDE 10 MEQ/100ML IV SOLN
10.0000 meq | INTRAVENOUS | Status: DC
  Administered 2021-03-21 (×5): 10 meq via INTRAVENOUS
  Filled 2021-03-21 (×2): qty 100

## 2021-03-21 MED ORDER — HALOPERIDOL LACTATE 2 MG/ML PO CONC
0.5000 mg | ORAL | Status: DC | PRN
  Filled 2021-03-21: qty 0.3

## 2021-03-21 NOTE — Progress Notes (Signed)
Referring Physician(s): Code Stroke   Supervising Physician: Luanne Bras  Patient Status:  [facility] - In-pt  Chief Complaint: Right MCA occlusion s/p thrombectomy 04/04/2021  Subjective: Patient recently extubated, her eyes are closed but she was able to follow basic commands. Her daughter was at the bedside. Per bedside RN, patient very restless/agitated throughout the night.   Allergies: Mirabegron and Urabeth [[email or web address]]  Medications: Prior to Admission medications   Medication Sig Start Date End Date Taking? Authorizing Provider  atorvastatin (LIPITOR) 10 MG tablet Take 10 mg by mouth at bedtime. 04/27/17  Yes [provider]  Cholecalciferol (VITAMIN D3) 1000 UNITS CAPS Take 1,000 Units by mouth daily.    Yes [provider]  fludrocortisone (FLORINEF) 0.1 MG tablet Take 0.1 mg by mouth daily. At 8:00 am 09/12/11  Yes [provider]  gabapentin (NEURONTIN) 300 MG capsule Take one pill tid Patient taking differently: Take 300 mg by mouth as directed. Take 1 capsule (300 mg) at bedtime and Take 1 capsule (300 mg) Daily PRN Neuropathy 08/22/16  Yes Milton Ferguson, MD  hydrocortisone (CORTEF) 10 MG tablet Take 5-10 mg by mouth as directed. Take 10 mg tablet by mouth at 8:00 am 5 mg tablet at noon 09/12/11  Yes [provider]  latanoprost (XALATAN) 0.005 % ophthalmic solution Place 1 drop into both eyes at bedtime. 04/09/20  Yes [provider]  Multiple Vitamins-Minerals (MULTIVITAMIN WITH MINERALS) tablet Take 1 tablet by mouth daily after lunch.    Yes [provider]  olmesartan (BENICAR) 40 MG tablet Take 40 mg by mouth daily. 04/19/20  Yes [provider]  Pseudoephedrine HCl (SINUS 12 HOUR PO) Take 1 tablet by mouth daily.   Yes [provider]     Vital Signs: BP (!) 141/56   Pulse 100   Temp 99.3 F (37.4 C) (Axillary)   Resp 16   Ht 5\' 4"  (1.626 m)   Wt 126 lb 5.2 oz (57.3 kg)   SpO2 92%   BMI  21.68 kg/m   Physical Exam Constitutional:      Appearance: She is ill-appearing.     Comments: Eyes closed; patient non-verbal but able to follow a few simple commands  Eyes:     Comments: Filmy layer over part of left eyeball; possibly due to antibiotic ointment administered by anesthesia for intubation.   Cardiovascular:     Rate and Rhythm: Normal rate and regular rhythm.     Comments: Right groin vascular site is soft, clean and dry.  Skin:    General: Skin is warm and dry.  Neurological:     Comments: Right gaze preference/left side neglect. Able to move right side; some movement observed on the left. She followed commands to squeeze hands, move legs and wiggle toes.     Imaging: IR CT Head Ltd  Result Date: 03/21/2021 INDICATION: Acute onset of right gaze deviation, non verbalization and left-sided weakness. Occluded right middle cerebral artery dominant superior division on CT angiogram of the head and neck. EXAM: 1. EMERGENT LARGE VESSEL OCCLUSION THROMBOLYSIS (anterior CIRCULATION) COMPARISON:  CT angiogram of the head and neck of 03/24/2021. MEDICATIONS: Ancef 2 g antibiotic was administered within 1 hour of the procedure. ANESTHESIA/SEDATION: General anesthesia. CONTRAST:  Omnipaque 350 approximately 80 cc. FLUOROSCOPY TIME:  Fluoroscopy Time: 55 minutes 4 seconds (379 mGy). COMPLICATIONS: None immediate. TECHNIQUE: Following a full explanation of the procedure along with the potential associated complications, an informed witnessed consent was obtained. The risks of  intracranial hemorrhage of 10%, worsening neurological deficit, ventilator dependency, death and inability to revascularize were all reviewed in detail with the patient's daughter. The patient was then put under general anesthesia by the Department of Anesthesiology at [facility]. The right groin was prepped and draped in the usual sterile fashion. Thereafter using modified Seldinger technique, transfemoral  access into the right common femoral artery was obtained without difficulty. Over a 0.035 inch guidewire an 8 [place] Pinnacle 25 cm sheath was inserted. Through this, and also over a 0.035 inch guidewire a 5 [place] JB 2 support catheter inside of an 087 balloon guide catheter, which had been prepped with 50% contrast and 50% heparinized saline infusion was advanced into the proximal right internal carotid artery. The exchange guidewire with support catheter was then removed. Good aspiration obtained from the hub of the balloon guide catheter with the distal end in the proximal right internal carotid artery. A gentle control arteriogram performed through the balloon guide catheter demonstrated no evidence of spasms, dissections or of intraluminal filling defects. Control arteriogram performed centered intracranially was then performed. FINDINGS: An initial innominate arteriogram demonstrates the right subclavian artery to be moderately narrowed proximally. The origin of the right vertebral artery is normal. The vessel is seen to opacify to the cranial skull base to the right posterior-inferior cerebellar artery. Distal to this the visualized right vertebrobasilar junction and proximal basilar artery as visualized is grossly patent. The right common carotid arteriogram demonstrates the right external carotid artery and its major branches to be widely patent. The right internal carotid artery at the bulb to the cranial skull base is widely patent. The petrous, the cavernous and the supraclinoid segments are widely patent. A right posterior communicating artery is seen opacifying the right posterior cerebral artery distribution. The right anterior cerebral artery opacifies into the capillary and venous phases. The right middle cerebral artery demonstrates patency of the inferior division into the capillary and venous phases. Angiographic occlusion of a prominent superior division is noted proximally. PROCEDURE: Through  the balloon guide catheter in the proximal right internal carotid artery, a combination of an 071 Zoom aspiration catheter inside of which was an 021 162 cm microcatheter was advanced over a 0.014 inch Aristotle micro guidewire with a J-tip configuration to the supraclinoid right ICA. The micro guidewire was then advanced using a torque device to the superior division proximally and then through it to the M2 M3 region followed by the microcatheter. The guidewire was removed. Good aspiration obtained from the hub of the microcatheter. A gentle control arteriogram performed through the microcatheter demonstrated safe position of the tip of the microcatheter. This was then connected to continuous heparinized saline infusion. A 3 mm x 40 mm Solitaire X retrieval device was then advanced to the distal end of the microcatheter and deployed in the usual manner. At this time the 071 Zoom aspiration catheter was advanced just proximal to the origin of the superior division. With proximal flow arrest in the right internal carotid artery, and constant aspiration using a 20 mL syringe at the hub of the balloon guide catheter for 2-1/2 minutes, the combination of the retrieval device, the microcatheter and the 071 Zoom aspiration catheter were retrieved and removed. Following reversal of flow arrest, a control arteriogram performed through the balloon guide catheter in the right internal carotid artery now demonstrated occlusion of the main trunk of the right middle cerebral artery distally. The anterior cerebral artery circulation and the posterior communicating artery circulation demonstrated wide  patency. A second pass was now made at this time using an 055 136 cm Zoom aspiration catheter inside of which was an 021 162 microcatheter over a 0.014 inch standard Synchro micro guidewire with a J configuration. The combination was advanced to the supraclinoid right ICA. Again access through the occluded right middle cerebral  artery was obtained with the micro guidewire and advanced into the M2 M3 region of the superior division. The micro guidewire was removed. Good aspiration obtained from the hub of the microcatheter. Gentle control arteriogram performed through the microcatheter demonstrated safe position of the tip of the microcatheter which was then connected to continuous heparinized saline infusion. At this time a 4 mm x 40 mm Solitaire X retrieval device was advanced to the distal end of the microcatheter and deployed in the usual manner by retrieving the microcatheter. The proximal portion of the retrieval device was just proximal to the occluded segment of the right middle cerebral artery. The 055 Zoom aspiration catheter was advanced to the origin of the superior division and connected to continuous heparinized saline infusion. With proximal flow arrest in the right internal carotid artery, constant aspiration was applied at the hub of the balloon guide catheter with a 20 mL syringe, for 3 minutes. Thereafter, a combination of the retrieval device, the microcatheter and Zoom aspiration catheter was retrieved and removed. Using reversal flow arrest, a control arteriogram performed through the balloon guide in the right internal carotid artery demonstrated a complete revascularization of the right middle cerebral artery distribution achieving a TICI 3 revascularization. Moderate spasm in the proximal MCA branch responded to 225 mcg of nitroglycerin intra-arterially. A final control arteriogram performed through the balloon guide catheter in the proximal right internal carotid artery continued to demonstrate a complete revascularization of the right middle cerebral artery with wide patency of the right anterior and the right posterior communicating arteries. The balloon guide catheter was removed. The 8 French Pinnacle sheath was removed with successful hemostasis achieved with an 8 [place] Angio-Seal closure device. Distal  pulses remained Dopplerable bilaterally unchanged. CT of the brain performed on the table demonstrated no evidence of intracranial hemorrhage or mass effect. The patient was left intubated on account of preprocedural responsiveness and to protect the airway from aspiration. Patient was then transferred to the neuro ICU for post thrombectomy management. IMPRESSION: Status post endovascular complete revascularization of occluded right middle cerebral artery M1 segment with 1 pass with a 3 mm x 40 mm Solitaire X retrieval device and aspiration, and a 4 mm x 40 mm Solitaire X retrieval device with proximal aspiration achieving a TICI 3 revascularization. PLAN: Follow-up as per referring MD. Electronically Signed   By: Luanne Bras M.D.   On: 03/21/2021 08:44   DG Chest Port 1 View  Result Date: 03/21/2021 CLINICAL DATA:  Follow-up exam. Lung carcinoma. Respiratory distress. EXAM: PORTABLE CHEST 1 VIEW COMPARISON:  04/06/2021 and earlier studies. FINDINGS: Endotracheal tube is stable, tip at the clavicular heads, 5 cm above the carina. Discoid type opacity in the right mid to upper lung is unchanged. There is opacity at the left lung base, retrocardiac, silhouetting the hemidiaphragm, also stable, but new when compared to the exam dated 03/12/2021. Left perihilar and mid lung pulmonary anastomosis staples are unchanged. Bilateral prominence of the interstitial markings is without significant change. Possible small left pleural effusion. No evidence of a pneumothorax. IMPRESSION: 1. No significant change from the previous day's exam. 2. Opacity at the left lung base is likely atelectasis,  possibly with an associated small effusion. Discoid type opacity in the right mid lung is consistent with atelectasis or scarring, also stable. No new lung abnormalities. 3. Stable endotracheal tube. Electronically Signed   By: Lajean Manes M.D.   On: 03/21/2021 09:36   DG CHEST PORT 1 VIEW  Result Date:  04/07/2021 CLINICAL DATA:  Hypotension.  History of lung cancer. EXAM: PORTABLE CHEST 1 VIEW COMPARISON:  March 16, 2021. FINDINGS: The heart size and mediastinal contours are within normal limits. Endotracheal tube is in good position. No pneumothorax is noted. Stable right upper opacity is noted. The visualized skeletal structures are unremarkable. IMPRESSION: Stable right upper lobe opacity. Endotracheal tube in good position. Aortic Atherosclerosis (ICD10-I70.0). Electronically Signed   By: Marijo Conception M.D.   On: 04/04/2021 13:28   IR PERCUTANEOUS ART THROMBECTOMY/INFUSION INTRACRANIAL INC DIAG ANGIO  Result Date: 03/21/2021 INDICATION: Acute onset of right gaze deviation, non verbalization and left-sided weakness. Occluded right middle cerebral artery dominant superior division on CT angiogram of the head and neck. EXAM: 1. EMERGENT LARGE VESSEL OCCLUSION THROMBOLYSIS (anterior CIRCULATION) COMPARISON:  CT angiogram of the head and neck of 03/27/2021. MEDICATIONS: Ancef 2 g antibiotic was administered within 1 hour of the procedure. ANESTHESIA/SEDATION: General anesthesia. CONTRAST:  Omnipaque 350 approximately 80 cc. FLUOROSCOPY TIME:  Fluoroscopy Time: 55 minutes 4 seconds (379 mGy). COMPLICATIONS: None immediate. TECHNIQUE: Following a full explanation of the procedure along with the potential associated complications, an informed witnessed consent was obtained. The risks of intracranial hemorrhage of 10%, worsening neurological deficit, ventilator dependency, death and inability to revascularize were all reviewed in detail with the patient's daughter. The patient was then put under general anesthesia by the Department of Anesthesiology at [facility]. The right groin was prepped and draped in the usual sterile fashion. Thereafter using modified Seldinger technique, transfemoral access into the right common femoral artery was obtained without difficulty. Over a 0.035 inch guidewire an 8  [place] Pinnacle 25 cm sheath was inserted. Through this, and also over a 0.035 inch guidewire a 5 [place] JB 2 support catheter inside of an 087 balloon guide catheter, which had been prepped with 50% contrast and 50% heparinized saline infusion was advanced into the proximal right internal carotid artery. The exchange guidewire with support catheter was then removed. Good aspiration obtained from the hub of the balloon guide catheter with the distal end in the proximal right internal carotid artery. A gentle control arteriogram performed through the balloon guide catheter demonstrated no evidence of spasms, dissections or of intraluminal filling defects. Control arteriogram performed centered intracranially was then performed. FINDINGS: An initial innominate arteriogram demonstrates the right subclavian artery to be moderately narrowed proximally. The origin of the right vertebral artery is normal. The vessel is seen to opacify to the cranial skull base to the right posterior-inferior cerebellar artery. Distal to this the visualized right vertebrobasilar junction and proximal basilar artery as visualized is grossly patent. The right common carotid arteriogram demonstrates the right external carotid artery and its major branches to be widely patent. The right internal carotid artery at the bulb to the cranial skull base is widely patent. The petrous, the cavernous and the supraclinoid segments are widely patent. A right posterior communicating artery is seen opacifying the right posterior cerebral artery distribution. The right anterior cerebral artery opacifies into the capillary and venous phases. The right middle cerebral artery demonstrates patency of the inferior division into the capillary and venous phases. Angiographic occlusion of a prominent superior  division is noted proximally. PROCEDURE: Through the balloon guide catheter in the proximal right internal carotid artery, a combination of an 071 Zoom  aspiration catheter inside of which was an 021 162 cm microcatheter was advanced over a 0.014 inch Aristotle micro guidewire with a J-tip configuration to the supraclinoid right ICA. The micro guidewire was then advanced using a torque device to the superior division proximally and then through it to the M2 M3 region followed by the microcatheter. The guidewire was removed. Good aspiration obtained from the hub of the microcatheter. A gentle control arteriogram performed through the microcatheter demonstrated safe position of the tip of the microcatheter. This was then connected to continuous heparinized saline infusion. A 3 mm x 40 mm Solitaire X retrieval device was then advanced to the distal end of the microcatheter and deployed in the usual manner. At this time the 071 Zoom aspiration catheter was advanced just proximal to the origin of the superior division. With proximal flow arrest in the right internal carotid artery, and constant aspiration using a 20 mL syringe at the hub of the balloon guide catheter for 2-1/2 minutes, the combination of the retrieval device, the microcatheter and the 071 Zoom aspiration catheter were retrieved and removed. Following reversal of flow arrest, a control arteriogram performed through the balloon guide catheter in the right internal carotid artery now demonstrated occlusion of the main trunk of the right middle cerebral artery distally. The anterior cerebral artery circulation and the posterior communicating artery circulation demonstrated wide patency. A second pass was now made at this time using an 055 136 cm Zoom aspiration catheter inside of which was an 021 162 microcatheter over a 0.014 inch standard Synchro micro guidewire with a J configuration. The combination was advanced to the supraclinoid right ICA. Again access through the occluded right middle cerebral artery was obtained with the micro guidewire and advanced into the M2 M3 region of the superior division.  The micro guidewire was removed. Good aspiration obtained from the hub of the microcatheter. Gentle control arteriogram performed through the microcatheter demonstrated safe position of the tip of the microcatheter which was then connected to continuous heparinized saline infusion. At this time a 4 mm x 40 mm Solitaire X retrieval device was advanced to the distal end of the microcatheter and deployed in the usual manner by retrieving the microcatheter. The proximal portion of the retrieval device was just proximal to the occluded segment of the right middle cerebral artery. The 055 Zoom aspiration catheter was advanced to the origin of the superior division and connected to continuous heparinized saline infusion. With proximal flow arrest in the right internal carotid artery, constant aspiration was applied at the hub of the balloon guide catheter with a 20 mL syringe, for 3 minutes. Thereafter, a combination of the retrieval device, the microcatheter and Zoom aspiration catheter was retrieved and removed. Using reversal flow arrest, a control arteriogram performed through the balloon guide in the right internal carotid artery demonstrated a complete revascularization of the right middle cerebral artery distribution achieving a TICI 3 revascularization. Moderate spasm in the proximal MCA branch responded to 225 mcg of nitroglycerin intra-arterially. A final control arteriogram performed through the balloon guide catheter in the proximal right internal carotid artery continued to demonstrate a complete revascularization of the right middle cerebral artery with wide patency of the right anterior and the right posterior communicating arteries. The balloon guide catheter was removed. The 8 [place] Pinnacle sheath was removed with successful hemostasis achieved with  an 8 [place] Angio-Seal closure device. Distal pulses remained Dopplerable bilaterally unchanged. CT of the brain performed on the table demonstrated no  evidence of intracranial hemorrhage or mass effect. The patient was left intubated on account of preprocedural responsiveness and to protect the airway from aspiration. Patient was then transferred to the neuro ICU for post thrombectomy management. IMPRESSION: Status post endovascular complete revascularization of occluded right middle cerebral artery M1 segment with 1 pass with a 3 mm x 40 mm Solitaire X retrieval device and aspiration, and a 4 mm x 40 mm Solitaire X retrieval device with proximal aspiration achieving a TICI 3 revascularization. PLAN: Follow-up as per referring MD. Electronically Signed   By: Luanne Bras M.D.   On: 03/21/2021 08:44   CT HEAD CODE STROKE WO CONTRAST  Addendum Date: 03/29/2021   ADDENDUM REPORT: 03/17/2021 08:41 ADDENDUM: Correction, RIGHT MCA territory ischemia with right side ASPECTS 7. Study was discussed by telephone with Dr. Irine Seal on 04/02/2021 at 0836 hours, and also with Dr. Rory Percy via text message at [phone] hours. Electronically Signed   By: Genevie Ann M.D.   On: 03/31/2021 08:41   Result Date: 04/02/2021 CLINICAL DATA:  Code stroke.  86 year old female. EXAM: CT HEAD WITHOUT CONTRAST TECHNIQUE: Contiguous axial images were obtained from the base of the skull through the vertex without intravenous contrast. COMPARISON:  Brain MRI 12/25/2011. FINDINGS: Brain: Cerebral volume is not significantly changed since 2013. No midline shift, mass effect, or evidence of intracranial mass lesion. Cytotoxic edema in the right frontal lobe (series 4, image 24) also involving the right operculum and insula. No acute hemorrhage identified. Normal basilar cisterns. Underlying patchy in chronic bilateral white matter hypodensity. New since 2013 but chronic appearing bilateral thalamic lacunar infarcts. No ventriculomegaly. Vascular: Mild Calcified atherosclerosis at the skull base. No suspicious intracranial vascular hyperdensity. Skull: Mild motion artifact. No acute osseous  abnormality identified. Sinuses/Orbits: Visualized paranasal sinuses and mastoids are clear. Other: Rightward gaze. Postoperative changes to both globes. No acute scalp soft tissue finding. ASPECTS [facility] Early CT Score) Total score (0-10 with 10 being normal): 7 (abnormal right M4, M 5, and insula segments). IMPRESSION: 1. Positive for left MCA territory cytotoxic edema compatible with acute or subacute infarct. ASPECTS 7. No associated hemorrhage or mass effect. 2. Evidence of advanced underlying small vessel disease, progressed since 2013 in the bilateral thalami. Electronically Signed: By: Genevie Ann M.D. On: 04/10/2021 08:29   CT ANGIO HEAD NECK W WO CM W PERF (CODE STROKE)  Result Date: 04/12/2021 CLINICAL DATA:  86 year old female code stroke presentation with right MCA ischemia on plain head CT. History of lung cancer. EXAM: CT ANGIOGRAPHY HEAD AND NECK CT PERFUSION BRAIN TECHNIQUE: Multidetector CT imaging of the head and neck was performed using the standard protocol during bolus administration of intravenous contrast. Multiplanar CT image reconstructions and MIPs were obtained to evaluate the vascular anatomy. Carotid stenosis measurements (when applicable) are obtained utilizing NASCET criteria, using the distal internal carotid diameter as the denominator. Multiphase CT imaging of the brain was performed following IV bolus contrast injection. Subsequent parametric perfusion maps were calculated using RAPID software. CONTRAST:  49mL OMNIPAQUE IOHEXOL 350 MG/ML SOLN COMPARISON:  Plain head CT 0821 hours today. Chest CT 05/09/2020 and earlier. FINDINGS: CT Brain Perfusion Findings: ASPECTS: 7 CBF (<30%) Volume: 25mL Perfusion (Tmax>6.0s) volume: 27mL - although some of this is in the inferior left temporal lobe and appears artifactual. Also, hypoperfusion index is 0.6, with 18 mL of  T-max greater than 10. Mismatch Volume: Less than 41mL Infarction Location:Right MCA CTA NECK Skeleton:  Torus palatinus, normal variant. Widespread cervical spine degeneration. No acute osseous abnormality identified. Upper chest: Small layering right pleural effusion is new from last year. Confluent right perihilar scarring and atelectasis with underlying centrilobular emphysema, previous partial left pneumonectomy. Small but increased precarinal lymph node on series 5, image 182 measures 10 mm short axis now (previously 7 mm) and there is questionable increased right hilar soft tissue on series 5, image 186. Other neck: Small volume retained secretions in the pharynx. Otherwise negative. Aortic arch: 3 vessel arch configuration with moderate to severe calcified arch atherosclerosis. Right carotid system: Brachiocephalic artery plaque without significant stenosis. Negative right CCA origin. Calcified plaque at the right carotid bifurcation, right ICA origin with less than 50 % stenosis with respect to the distal vessel. The right ICA is mildly tortuous and patent to the skull base. Left carotid system: Mild calcified plaque at the left CCA origin and left carotid bifurcation without stenosis. Tortuous left ICA below the skull base. Vertebral arteries: Calcified plaque in the proximal right subclavian artery with less than 50% stenosis. Normal right vertebral artery origin. Tortuous right vertebral artery appears patent to the skull base without stenosis. Moderate atherosclerosis in the proximal left subclavian artery without significant stenosis. Left vertebral artery origin is spared. Left vertebral artery is fairly codominant and tortuous, patent to the V3 segment without stenosis. There is mild irregularity in the distal V3 segment and at the skull base without significant stenosis. CTA HEAD Posterior circulation: Tortuous distal vertebral arteries, the left is mildly dominant. Patent right PICA origin. Patent vertebrobasilar junction without stenosis. Patent basilar artery without stenosis. SCA and left PCA  origin are patent. Fetal type right PCA origin is patent. Bilateral PCA branches are within normal limits. Anterior circulation: Both ICA siphons are patent. Minimal ICA siphon plaque and no siphon stenosis. Early cavernous sinus enhancement. Normal right posterior communicating artery origin. Patent carotid termini. Patent MCA and ACA origins. Diminutive anterior communicating artery. Bilateral ACA branches, left MCA M1 segment, left MCA bifurcation, and left MCA branches are within normal limits. Right MCA M1 segment and trifurcation are patent but there is a right MCA M2 branch occlusion on series 9, image 37, series 12, image 9 at the middle sylvian division. Mild irregularity of the other enhancing right MCA branches. Venous sinuses: Patent. Anatomic variants: Mildly dominant left vertebral artery V4 segment. Review of the MIP images confirms the above findings IMPRESSION: 1. Positive for ELVO: Right MCA M2 branch occlusion, middle Sylvian division. 2. CT Perfusion detects a an infarct core of 15 mL which appears fairly concordant to the ASPECTS of 7. However, some of the 32 mL penumbra is artifactual (in the left temporal lobe). And Hypoperfusion Index is 0.6. 3. Right CCA and carotid bifurcation atherosclerosis without significant stenosis. Mild atherosclerosis elsewhere in the head and neck. Aortic Atherosclerosis (ICD10-I70.0). 4. Emphysema (ICD10-J43.9) and posttreatment changes to both visible lungs. Small but increased anterior carina mediastinal lymph node since last year, with questionable increased right hilar nodal tissue raising the possibility of active right lung cancer. Small layering right pleural effusion. Salient findings were discussed with Dr. Rory Percy beginning at 0839 hours on 04/13/2021 by text messaging. Electronically Signed   By: Genevie Ann M.D.   On: 04/09/2021 09:01    Labs:  CBC: Recent Labs    03/18/21 0318 03/19/21 0315 03/29/2021 0456 03/21/21 0309 03/21/21 0439  WBC 6.1 6.4  9.1 13.8*  --   HGB 10.0* 9.2* 10.1* 8.4* 7.8*  HCT 29.5* 27.8* 29.2* 25.9* 23.0*  PLT 119* 123* 130* 187  --     COAGS: Recent Labs    03/09/2021 1416  INR 1.3*  APTT 35    BMP: Recent Labs    03/18/21 0318 03/19/21 0315 03/24/2021 0456 03/21/21 0309 03/21/21 0439  NA 145 142 141 140 144  K 3.1* 3.5 3.2* 3.1* 3.0*  CL 114* 111 110 110  --   CO2 22 25 26  19*  --   GLUCOSE 82 99 81 139*  --   BUN 12 14 11 9   --   CALCIUM 8.2* 8.1* 8.2* 7.0*  --   CREATININE 0.54 0.62 0.50 0.80  --   GFRNONAA >60 >60 >60 >60  --     LIVER FUNCTION TESTS: Recent Labs    02/26/2021 1416  BILITOT 1.2  AST 36  ALT 21  ALKPHOS 48  PROT 6.2*  ALBUMIN 2.9*    Assessment and Plan:  Right MCA occlusion s/p thrombectomy 04/03/2021: Patient unable to go for post-procedure CT head due to clinical instability. She developed afib with RVR last night and received a bolus of amiodarone; she spontaneously converted back to NSR. At the time of our assessment she was on a levophed infusion for pressure support.   Per note by neurology team, patient's daughter states her mother would not want to live in a debilitated state and the decision was made to transition to comfort care.   NIR will sign off. Please call NIR with any questions.   Electronically Signed: Soyla Dryer, AGACNP-BC [phone] 03/21/2021, 12:43 PM   I spent a total of 15 Minutes at the the patient's bedside AND on the patient's hospital floor or unit, greater than 50% of which was counseling/coordinating care for right MCA occlusion s/p thrombectomy 03/19/2021

## 2021-03-21 NOTE — Plan of Care (Signed)
Met with daughter [name] and friend Izora Gala at bedside.  Patient nonverbal, not follow commands, left hemiplegia, restless seems not in comfort.  They stated that patient not in good shape, she had expressed before that she does not want to live like this.  Daughter requested no more blood draw or fingerstick, she requested comfort care measures to keep patient free of pain and let nature take course. we will initiate comfort care measures.  Rosalin Hawking, MD PhD Stroke Neurology 03/21/2021 12:01 PM

## 2021-03-21 NOTE — Progress Notes (Signed)
Progress Note  Patient Name: Leslie Petersen Date of Encounter: 03/21/2021  [facility] Cardiologist: Minus Breeding, MD   Subjective   Patient just extubated   Appears in NAD   Awake, no focal gaze     Inpatient Medications    Scheduled Meds:   stroke: mapping our early stages of recovery book   Does not apply Once   chlorhexidine gluconate (MEDLINE KIT)  15 mL Mouth Rinse BID   Chlorhexidine Gluconate Cloth  6 each Topical Daily   enoxaparin (LOVENOX) injection  30 mg Subcutaneous Q24H   feeding supplement  237 mL Oral TID BM   fludrocortisone  0.1 mg Per Tube Q24H   gabapentin  300 mg Oral QHS   guaiFENesin  1,200 mg Oral BID   hydrocortisone sod succinate (SOLU-CORTEF) inj  100 mg Intravenous Q12H   latanoprost  1 drop Both Eyes QHS   mouth rinse  15 mL Mouth Rinse 10 times per day   sodium chloride flush  3 mL Intravenous Q12H   Continuous Infusions:  sodium chloride     ampicillin-sulbactam (UNASYN) IV Stopped (03/21/21 0257)   norepinephrine (LEVOPHED) Adult infusion 9 mcg/min (03/21/21 0700)   potassium chloride 10 mEq (03/21/21 0813)   PRN Meds: acetaminophen **OR** acetaminophen (TYLENOL) oral liquid 160 mg/5 mL **OR** acetaminophen, fentaNYL (SUBLIMAZE) injection, guaiFENesin, ipratropium, levalbuterol, menthol-cetylpyridinium, polyethylene glycol, QUEtiapine   Vital Signs    Vitals:   03/21/21 0700 03/21/21 0715 03/21/21 0724 03/21/21 0815  BP: (!) 155/61 (!) 145/59 (!) 145/59 (!) 118/53  Pulse: 95 96 98 (!) 102  Resp: (!) 32 (!) 26 (!) 32   Temp:      TempSrc:      SpO2: 96% 98% 98% 98%  Weight:      Height:        Intake/Output Summary (Last 24 hours) at 03/21/2021 0816 Last data filed at 03/21/2021 0700 Gross per 24 hour  Intake 5987.95 ml  Output 2000 ml  Net 3987.95 ml   Last 3 Weights 03/21/2021 03/24/2021 03/19/2021  Weight (lbs) 126 lb 5.2 oz 115 lb 1.3 oz 128 lb 8.5 oz  Weight (kg) 57.3 kg 52.2 kg 58.3 kg      Telemetry    Afib with  RVR and now SR  - Personally Reviewed  ECG     No new    Personally Reviewed  Physical Exam   GEN  Pt extubated in NAD    Neck: JVP mildly increased  Cardiac: RRR, II/VI systolic murmurs, Respiratory: Rel cleare    GI: Soft, nontender, non-distended  MS: Hands/forewarms with edema; Neuro:  Deferred    Labs    High Sensitivity Troponin:   Recent Labs  Lab 03/09/2021 1915 03/04/2021 2139  TROPONINIHS 3,493* 3,035*     Chemistry Recent Labs  Lab 02/17/2021 1416 02/25/2021 1915 03/18/21 0318 03/19/21 0315 04/14/2021 0456 03/21/21 0309 03/21/21 0439  NA 145   < > 145 142 141 140 144  K 3.2*   < > 3.1* 3.5 3.2* 3.1* 3.0*  CL 110   < > 114* 111 110 110  --   CO2 26   < > 22 25 26  19*  --   GLUCOSE 115*   < > 82 99 81 139*  --   BUN 15   < > 12 14 11 9   --   CREATININE 0.74   < > 0.54 0.62 0.50 0.80  --   CALCIUM 8.7*   < > 8.2* 8.1*  8.2* 7.0*  --   MG  --    < > 2.0 2.2  --  1.4*  --   PROT 6.2*  --   --   --   --   --   --   ALBUMIN 2.9*  --   --   --   --   --   --   AST 36  --   --   --   --   --   --   ALT 21  --   --   --   --   --   --   ALKPHOS 48  --   --   --   --   --   --   BILITOT 1.2  --   --   --   --   --   --   GFRNONAA >60   < > >60 >60 >60 >60  --   ANIONGAP 9   < > 9 6 5 11   --    < > = values in this interval not displayed.    Lipids  Recent Labs  Lab 03/19/2021 0456  CHOL 78  TRIG 55  HDL 37*  LDLCALC 30  CHOLHDL 2.1    Hematology Recent Labs  Lab 03/19/21 0315 04/07/2021 0456 03/21/21 0309 03/21/21 0439  WBC 6.4 9.1 13.8*  --   RBC 2.53* 2.74* 2.35*  --   HGB 9.2* 10.1* 8.4* 7.8*  HCT 27.8* 29.2* 25.9* 23.0*  MCV 109.9* 106.6* 110.2*  --   MCH 36.4* 36.9* 35.7*  --   MCHC 33.1 34.6 32.4  --   RDW 13.6 13.2 13.9  --   PLT 123* 130* 187  --    Thyroid  Recent Labs  Lab 03/03/2021 1915  TSH 2.628    BNPNo results for input(s): BNP, PROBNP in the last 168 hours.  DDimer No results for input(s): DDIMER in the last 168 hours.    Radiology    DG CHEST PORT 1 VIEW  Result Date: 04/02/2021 CLINICAL DATA:  Hypotension.  History of lung cancer. EXAM: PORTABLE CHEST 1 VIEW COMPARISON:  March 16, 2021. FINDINGS: The heart size and mediastinal contours are within normal limits. Endotracheal tube is in good position. No pneumothorax is noted. Stable right upper opacity is noted. The visualized skeletal structures are unremarkable. IMPRESSION: Stable right upper lobe opacity. Endotracheal tube in good position. Aortic Atherosclerosis (ICD10-I70.0). Electronically Signed   By: Marijo Conception M.D.   On: 04/16/2021 13:28   CT HEAD CODE STROKE WO CONTRAST  Addendum Date: 04/06/2021   ADDENDUM REPORT: 03/19/2021 08:41 ADDENDUM: Correction, RIGHT MCA territory ischemia with right side ASPECTS 7. Study was discussed by telephone with Dr. Irine Seal on 03/23/2021 at 0836 hours, and also with Dr. Rory Percy via text message at [phone] hours. Electronically Signed   By: Genevie Ann M.D.   On: 04/04/2021 08:41   Result Date: 03/17/2021 CLINICAL DATA:  Code stroke.  86 year old female. EXAM: CT HEAD WITHOUT CONTRAST TECHNIQUE: Contiguous axial images were obtained from the base of the skull through the vertex without intravenous contrast. COMPARISON:  Brain MRI 12/25/2011. FINDINGS: Brain: Cerebral volume is not significantly changed since 2013. No midline shift, mass effect, or evidence of intracranial mass lesion. Cytotoxic edema in the right frontal lobe (series 4, image 24) also involving the right operculum and insula. No acute hemorrhage identified. Normal basilar cisterns. Underlying patchy in chronic bilateral white matter hypodensity. New since 2013  but chronic appearing bilateral thalamic lacunar infarcts. No ventriculomegaly. Vascular: Mild Calcified atherosclerosis at the skull base. No suspicious intracranial vascular hyperdensity. Skull: Mild motion artifact. No acute osseous abnormality identified. Sinuses/Orbits: Visualized paranasal  sinuses and mastoids are clear. Other: Rightward gaze. Postoperative changes to both globes. No acute scalp soft tissue finding. ASPECTS [facility] Stroke Program Early CT Score) Total score (0-10 with 10 being normal): 7 (abnormal right M4, M 5, and insula segments). IMPRESSION: 1. Positive for left MCA territory cytotoxic edema compatible with acute or subacute infarct. ASPECTS 7. No associated hemorrhage or mass effect. 2. Evidence of advanced underlying small vessel disease, progressed since 2013 in the bilateral thalami. Electronically Signed: By: Genevie Ann M.D. On: 04/12/2021 08:29   CT ANGIO HEAD NECK W WO CM W PERF (CODE STROKE)  Result Date: 03/24/2021 CLINICAL DATA:  86 year old female code stroke presentation with right MCA ischemia on plain head CT. History of lung cancer. EXAM: CT ANGIOGRAPHY HEAD AND NECK CT PERFUSION BRAIN TECHNIQUE: Multidetector CT imaging of the head and neck was performed using the standard protocol during bolus administration of intravenous contrast. Multiplanar CT image reconstructions and MIPs were obtained to evaluate the vascular anatomy. Carotid stenosis measurements (when applicable) are obtained utilizing NASCET criteria, using the distal internal carotid diameter as the denominator. Multiphase CT imaging of the brain was performed following IV bolus contrast injection. Subsequent parametric perfusion maps were calculated using RAPID software. CONTRAST:  33m OMNIPAQUE IOHEXOL 350 MG/ML SOLN COMPARISON:  Plain head CT 0821 hours today. Chest CT 05/09/2020 and earlier. FINDINGS: CT Brain Perfusion Findings: ASPECTS: 7 CBF (<30%) Volume: 12mPerfusion (Tmax>6.0s) volume: 3271m although some of this is in the inferior left temporal lobe and appears artifactual. Also, hypoperfusion index is 0.6, with 18 mL of T-max greater than 10. Mismatch Volume: Less than 76m63mfarction Location:Right MCA CTA NECK Skeleton: Torus palatinus, normal variant. Widespread cervical spine  degeneration. No acute osseous abnormality identified. Upper chest: Small layering right pleural effusion is new from last year. Confluent right perihilar scarring and atelectasis with underlying centrilobular emphysema, previous partial left pneumonectomy. Small but increased precarinal lymph node on series 5, image 182 measures 10 mm short axis now (previously 7 mm) and there is questionable increased right hilar soft tissue on series 5, image 186. Other neck: Small volume retained secretions in the pharynx. Otherwise negative. Aortic arch: 3 vessel arch configuration with moderate to severe calcified arch atherosclerosis. Right carotid system: Brachiocephalic artery plaque without significant stenosis. Negative right CCA origin. Calcified plaque at the right carotid bifurcation, right ICA origin with less than 50 % stenosis with respect to the distal vessel. The right ICA is mildly tortuous and patent to the skull base. Left carotid system: Mild calcified plaque at the left CCA origin and left carotid bifurcation without stenosis. Tortuous left ICA below the skull base. Vertebral arteries: Calcified plaque in the proximal right subclavian artery with less than 50% stenosis. Normal right vertebral artery origin. Tortuous right vertebral artery appears patent to the skull base without stenosis. Moderate atherosclerosis in the proximal left subclavian artery without significant stenosis. Left vertebral artery origin is spared. Left vertebral artery is fairly codominant and tortuous, patent to the V3 segment without stenosis. There is mild irregularity in the distal V3 segment and at the skull base without significant stenosis. CTA HEAD Posterior circulation: Tortuous distal vertebral arteries, the left is mildly dominant. Patent right PICA origin. Patent vertebrobasilar junction without stenosis. Patent basilar artery without stenosis. SCA and  left PCA origin are patent. Fetal type right PCA origin is patent.  Bilateral PCA branches are within normal limits. Anterior circulation: Both ICA siphons are patent. Minimal ICA siphon plaque and no siphon stenosis. Early cavernous sinus enhancement. Normal right posterior communicating artery origin. Patent carotid termini. Patent MCA and ACA origins. Diminutive anterior communicating artery. Bilateral ACA branches, left MCA M1 segment, left MCA bifurcation, and left MCA branches are within normal limits. Right MCA M1 segment and trifurcation are patent but there is a right MCA M2 branch occlusion on series 9, image 37, series 12, image 9 at the middle sylvian division. Mild irregularity of the other enhancing right MCA branches. Venous sinuses: Patent. Anatomic variants: Mildly dominant left vertebral artery V4 segment. Review of the MIP images confirms the above findings IMPRESSION: 1. Positive for ELVO: Right MCA M2 branch occlusion, middle Sylvian division. 2. CT Perfusion detects a an infarct core of 15 mL which appears fairly concordant to the ASPECTS of 7. However, some of the 32 mL penumbra is artifactual (in the left temporal lobe). And Hypoperfusion Index is 0.6. 3. Right CCA and carotid bifurcation atherosclerosis without significant stenosis. Mild atherosclerosis elsewhere in the head and neck. Aortic Atherosclerosis (ICD10-I70.0). 4. Emphysema (ICD10-J43.9) and posttreatment changes to both visible lungs. Small but increased anterior carina mediastinal lymph node since last year, with questionable increased right hilar nodal tissue raising the possibility of active right lung cancer. Small layering right pleural effusion. Salient findings were discussed with Dr. Rory Percy beginning at 0839 hours on 04/05/2021 by text messaging. Electronically Signed   By: Genevie Ann M.D.   On: 04/01/2021 09:01    Cardiac Studies   Echo  03/17/21  eft ventricular ejection fraction, by estimation, is 65 to 70%. The left ventricle has normal function. The left ventricle has no regional  wall motion abnormalities. There is moderate left ventricular hypertrophy of the basal-septal segment. Left ventricular diastolic parameters are consistent with Grade I diastolic dysfunction (impaired relaxation). Elevated left ventricular end-diastolic pressure. 1. Right ventricular systolic function is normal. The right ventricular size is normal. There is normal pulmonary artery systolic pressure. 2. The mitral valve is degenerative. Mild mitral valve regurgitation. No evidence of mitral stenosis. 3. The aortic valve is normal in structure. Aortic valve regurgitation is mild. No aortic stenosis is present. 4. The inferior vena cava is normal in size with greater than 50% respiratory variability, suggesting right atrial pressure of 3 mmHg.  Patient Profile     86 y.o. female admitted with weakness, possible pneumonia   New onsiet Afib   Now with CVA  Assessment & Plan    1  CVA  Underwent thrombectomy Most likely due to PAF   Episode in hospital was transient (less that 24 hours)   Should not have lead to neuro event   Probably had more prior to admit  Pt is now extubated    L sided deficit   Will need anticoagulation   Defer to neuro starting     2  PAF   Recurrent afib last night in setting of pressors   AMio transiently used   Now in SR    Will need long term anticoagulatoin    3  Blood pressure   Pt is being tapered down on pressors    Follow   Will need diureisis with lasix   Hold for now to avoid hypotension  Oxygenation is OK     4  Elevated troponin  Peak troponin  3493  Felt demand in setting of sepsis and 3 vessel dz.   No CP       For questions or updates, please contact [place] Please consult [email or web address] for contact info under        Signed, Dorris Carnes, MD  03/21/2021, 8:16 AM

## 2021-03-21 NOTE — Progress Notes (Signed)
[facility] ADULT ICU REPLACEMENT PROTOCOL   The patient does apply for the [facility] Adult ICU Electrolyte Replacment Protocol based on the criteria listed below:   1.Exclusion criteria: TCTS patients, ECMO patients and Hypothermia Protocol, and   Dialysis patients 2. Is GFR >/= 30 ml/min? Yes.    Patient's GFR today is >60 3. Is SCr </= 2? Yes.   Patient's SCr is 0.8 mg/dL 4. Did SCr increase >/= 0.5 in 24 hours? No. 5.Pt's weight >40kg  Yes.   6. Abnormal electrolyte(s): K 3.1, mag 1.4  7. Electrolytes replaced per protocol 8.  Call MD STAT for K+ </= 2.5, Phos </= 1, or Mag </= 1 Physician:    Ronda Fairly A 03/21/2021 4:55 AM

## 2021-03-21 NOTE — Progress Notes (Signed)
STROKE TEAM PROGRESS NOTE   SUBJECTIVE (INTERVAL HISTORY) Her friend Izora Gala is at the bedside.  Patient reclining in bed, restless, seems in pain, not able to find a better position.  Patient was extubated this morning, tolerating so far, however still nonverbal, not following commands.  Left hemiplegia.  Right gaze preference and left neglect.  Per friend Izora Gala, patient has expressed will that not want to live in debilitated state.  Patient daughter will come soon.  OBJECTIVE Temp:  [97.1 F (36.2 C)-100.8 F (38.2 C)] 99.3 F (37.4 C) (10/05 0400) Pulse Rate:  [69-139] 100 (10/05 1130) Cardiac Rhythm: Normal sinus rhythm (10/05 0730) Resp:  [15-33] 16 (10/05 1130) BP: (68-168)/(40-94) 141/56 (10/05 1130) SpO2:  [92 %-100 %] 92 % (10/05 1130) FiO2 (%):  [40 %] 40 % (10/05 0815) Weight:  [57.3 kg] 57.3 kg (10/05 0440)  Recent Labs  Lab 04/12/2021 1528 04/01/2021 1921 04/13/2021 2321 03/21/21 0314 03/21/21 0755  GLUCAP 74 112* 113* 145* 146*   Recent Labs  Lab 03/15/2021 1915 03/17/21 0312 03/18/21 0318 03/19/21 0315 04/08/2021 0456 03/21/21 0309 03/21/21 0439  NA  --  141 145 142 141 140 144  K  --  4.1 3.1* 3.5 3.2* 3.1* 3.0*  CL  --  112* 114* 111 110 110  --   CO2  --  25 22 25 26  19*  --   GLUCOSE  --  87 82 99 81 139*  --   BUN  --  14 12 14 11 9   --   CREATININE  --  0.58 0.54 0.62 0.50 0.80  --   CALCIUM  --  7.7* 8.2* 8.1* 8.2* 7.0*  --   MG 1.7 2.3 2.0 2.2  --  1.4*  --   PHOS  --   --   --   --   --  4.3  --    Recent Labs  Lab 02/16/2021 1416  AST 36  ALT 21  ALKPHOS 48  BILITOT 1.2  PROT 6.2*  ALBUMIN 2.9*   Recent Labs  Lab 02/24/2021 1416 03/17/21 0312 03/18/21 0318 03/19/21 0315 03/18/2021 0456 03/21/21 0309 03/21/21 0439  WBC 8.4 6.4 6.1 6.4 9.1 13.8*  --   NEUTROABS 4.9 4.4 4.0  --  6.5  --   --   HGB 10.6* 9.8* 10.0* 9.2* 10.1* 8.4* 7.8*  HCT 32.7* 30.0* 29.5* 27.8* 29.2* 25.9* 23.0*  MCV 112.0* 110.7* 109.3* 109.9* 106.6* 110.2*  --   PLT 147*  114* 119* 123* 130* 187  --    No results for input(s): CKTOTAL, CKMB, CKMBINDEX, TROPONINI in the last 168 hours. No results for input(s): LABPROT, INR in the last 72 hours. No results for input(s): COLORURINE, LABSPEC, [place], GLUCOSEU, HGBUR, BILIRUBINUR, KETONESUR, PROTEINUR, UROBILINOGEN, NITRITE, LEUKOCYTESUR in the last 72 hours.  Invalid input(s): APPERANCEUR     Component Value Date/Time   CHOL 78 03/19/2021 0456   TRIG 55 03/31/2021 0456   HDL 37 (L) 04/04/2021 0456   CHOLHDL 2.1 04/08/2021 0456   VLDL 11 03/21/2021 0456   LDLCALC 30 03/29/2021 0456   Lab Results  Component Value Date   HGBA1C 5.1 03/21/2021   No results found for: LABOPIA, COCAINSCRNUR, LABBENZ, AMPHETMU, THCU, LABBARB  No results for input(s): ETH in the last 168 hours.  I have personally reviewed the radiological images below and agree with the radiology interpretations.  DG Chest 1 View  Result Date: 03/02/2021 CLINICAL DATA:  Weakness and right flank pain. EXAM: CHEST  1 VIEW COMPARISON:  Chest x-ray 02/06/2015.  CT chest 05/09/2020. FINDINGS: Postsurgical changes in the left upper lobe appear stable. There is increasing right upper lobe/perihilar airspace disease. There is a band of opacity in the right upper lobe similar to the prior study. A large hiatal hernia is again seen. There are atherosclerotic calcifications of the aorta. Heart size is within normal limits. The costophrenic angles are clear. There is no pneumothorax. No acute fractures are seen. IMPRESSION: 1. New right upper lobe/perihilar airspace disease. Findings may be related to infection. Other etiologies such as neoplasm cannot be excluded given patient's history. 2. Stable postsurgical changes in the left lung. Electronically Signed   By: Ronney Asters M.D.   On: 02/18/2021 15:06   IR CT Head Ltd  Result Date: 03/21/2021 INDICATION: Acute onset of right gaze deviation, non verbalization and left-sided weakness. Occluded right middle  cerebral artery dominant superior division on CT angiogram of the head and neck. EXAM: 1. EMERGENT LARGE VESSEL OCCLUSION THROMBOLYSIS (anterior CIRCULATION) COMPARISON:  CT angiogram of the head and neck of 04/10/2021. MEDICATIONS: Ancef 2 g antibiotic was administered within 1 hour of the procedure. ANESTHESIA/SEDATION: General anesthesia. CONTRAST:  Omnipaque 350 approximately 80 cc. FLUOROSCOPY TIME:  Fluoroscopy Time: 55 minutes 4 seconds (379 mGy). COMPLICATIONS: None immediate. TECHNIQUE: Following a full explanation of the procedure along with the potential associated complications, an informed witnessed consent was obtained. The risks of intracranial hemorrhage of 10%, worsening neurological deficit, ventilator dependency, death and inability to revascularize were all reviewed in detail with the patient's daughter. The patient was then put under general anesthesia by the Department of Anesthesiology at [facility]. The right groin was prepped and draped in the usual sterile fashion. Thereafter using modified Seldinger technique, transfemoral access into the right common femoral artery was obtained without difficulty. Over a 0.035 inch guidewire an 8 [place] Pinnacle 25 cm sheath was inserted. Through this, and also over a 0.035 inch guidewire a 5 [place] JB 2 support catheter inside of an 087 balloon guide catheter, which had been prepped with 50% contrast and 50% heparinized saline infusion was advanced into the proximal right internal carotid artery. The exchange guidewire with support catheter was then removed. Good aspiration obtained from the hub of the balloon guide catheter with the distal end in the proximal right internal carotid artery. A gentle control arteriogram performed through the balloon guide catheter demonstrated no evidence of spasms, dissections or of intraluminal filling defects. Control arteriogram performed centered intracranially was then performed. FINDINGS: An initial  innominate arteriogram demonstrates the right subclavian artery to be moderately narrowed proximally. The origin of the right vertebral artery is normal. The vessel is seen to opacify to the cranial skull base to the right posterior-inferior cerebellar artery. Distal to this the visualized right vertebrobasilar junction and proximal basilar artery as visualized is grossly patent. The right common carotid arteriogram demonstrates the right external carotid artery and its major branches to be widely patent. The right internal carotid artery at the bulb to the cranial skull base is widely patent. The petrous, the cavernous and the supraclinoid segments are widely patent. A right posterior communicating artery is seen opacifying the right posterior cerebral artery distribution. The right anterior cerebral artery opacifies into the capillary and venous phases. The right middle cerebral artery demonstrates patency of the inferior division into the capillary and venous phases. Angiographic occlusion of a prominent superior division is noted proximally. PROCEDURE: Through the balloon guide catheter in the proximal right  internal carotid artery, a combination of an 071 Zoom aspiration catheter inside of which was an 021 162 cm microcatheter was advanced over a 0.014 inch Aristotle micro guidewire with a J-tip configuration to the supraclinoid right ICA. The micro guidewire was then advanced using a torque device to the superior division proximally and then through it to the M2 M3 region followed by the microcatheter. The guidewire was removed. Good aspiration obtained from the hub of the microcatheter. A gentle control arteriogram performed through the microcatheter demonstrated safe position of the tip of the microcatheter. This was then connected to continuous heparinized saline infusion. A 3 mm x 40 mm Solitaire X retrieval device was then advanced to the distal end of the microcatheter and deployed in the usual manner.  At this time the 071 Zoom aspiration catheter was advanced just proximal to the origin of the superior division. With proximal flow arrest in the right internal carotid artery, and constant aspiration using a 20 mL syringe at the hub of the balloon guide catheter for 2-1/2 minutes, the combination of the retrieval device, the microcatheter and the 071 Zoom aspiration catheter were retrieved and removed. Following reversal of flow arrest, a control arteriogram performed through the balloon guide catheter in the right internal carotid artery now demonstrated occlusion of the main trunk of the right middle cerebral artery distally. The anterior cerebral artery circulation and the posterior communicating artery circulation demonstrated wide patency. A second pass was now made at this time using an 055 136 cm Zoom aspiration catheter inside of which was an 021 162 microcatheter over a 0.014 inch standard Synchro micro guidewire with a J configuration. The combination was advanced to the supraclinoid right ICA. Again access through the occluded right middle cerebral artery was obtained with the micro guidewire and advanced into the M2 M3 region of the superior division. The micro guidewire was removed. Good aspiration obtained from the hub of the microcatheter. Gentle control arteriogram performed through the microcatheter demonstrated safe position of the tip of the microcatheter which was then connected to continuous heparinized saline infusion. At this time a 4 mm x 40 mm Solitaire X retrieval device was advanced to the distal end of the microcatheter and deployed in the usual manner by retrieving the microcatheter. The proximal portion of the retrieval device was just proximal to the occluded segment of the right middle cerebral artery. The 055 Zoom aspiration catheter was advanced to the origin of the superior division and connected to continuous heparinized saline infusion. With proximal flow arrest in the right  internal carotid artery, constant aspiration was applied at the hub of the balloon guide catheter with a 20 mL syringe, for 3 minutes. Thereafter, a combination of the retrieval device, the microcatheter and Zoom aspiration catheter was retrieved and removed. Using reversal flow arrest, a control arteriogram performed through the balloon guide in the right internal carotid artery demonstrated a complete revascularization of the right middle cerebral artery distribution achieving a TICI 3 revascularization. Moderate spasm in the proximal MCA branch responded to 225 mcg of nitroglycerin intra-arterially. A final control arteriogram performed through the balloon guide catheter in the proximal right internal carotid artery continued to demonstrate a complete revascularization of the right middle cerebral artery with wide patency of the right anterior and the right posterior communicating arteries. The balloon guide catheter was removed. The 8 [place] Pinnacle sheath was removed with successful hemostasis achieved with an 8 [place] Angio-Seal closure device. Distal pulses remained Dopplerable bilaterally unchanged. CT of  the brain performed on the table demonstrated no evidence of intracranial hemorrhage or mass effect. The patient was left intubated on account of preprocedural responsiveness and to protect the airway from aspiration. Patient was then transferred to the neuro ICU for post thrombectomy management. IMPRESSION: Status post endovascular complete revascularization of occluded right middle cerebral artery M1 segment with 1 pass with a 3 mm x 40 mm Solitaire X retrieval device and aspiration, and a 4 mm x 40 mm Solitaire X retrieval device with proximal aspiration achieving a TICI 3 revascularization. PLAN: Follow-up as per referring MD. Electronically Signed   By: Luanne Bras M.D.   On: 03/21/2021 08:44   CT L-SPINE NO CHARGE  Result Date: 02/19/2021 CLINICAL DATA:  Back pain. Worsening weakness for 1  week with right flank pain. EXAM: CT LUMBAR SPINE WITHOUT CONTRAST TECHNIQUE: Multidetector CT imaging of the lumbar spine was performed without intravenous contrast administration. Multiplanar CT image reconstructions were also generated. COMPARISON:  Lumbar spine MRI 09/05/2012 FINDINGS: Segmentation: The lowest fully formed intervertebral disc space is L5-S1. There are no ribs at T12. Alignment: Chronic straightening of the normal lumbar lordosis. No listhesis. Vertebrae: Unchanged chronic T12 and L1 compression fractures. No acute fracture or suspicious osseous lesion. Scattered small Schmorl's nodes. Diffuse osteopenia. Paraspinal and other soft tissues: No acute abnormality identified in the paraspinal soft tissues. Intra-and pelvic contents reported separately. Disc levels: Similar appearance of diffuse lumbar disc and facet degeneration compared to the prior MRI, most notable at L4-5 where there is moderate spinal stenosis, moderate bilateral lateral recess stenosis, and moderate right neural foraminal stenosis. Unchanged moderate left greater than right neural foraminal stenosis at L5-S1. IMPRESSION: 1. No acute osseous abnormality identified in the lumbar spine. 2. Unchanged chronic T12 and L1 compression fractures. 3. Unchanged lumbar disc and facet degeneration. Electronically Signed   By: Logan Bores M.D.   On: 03/11/2021 14:43   DG Chest Port 1 View  Result Date: 03/21/2021 CLINICAL DATA:  Follow-up exam. Lung carcinoma. Respiratory distress. EXAM: PORTABLE CHEST 1 VIEW COMPARISON:  04/07/2021 and earlier studies. FINDINGS: Endotracheal tube is stable, tip at the clavicular heads, 5 cm above the carina. Discoid type opacity in the right mid to upper lung is unchanged. There is opacity at the left lung base, retrocardiac, silhouetting the hemidiaphragm, also stable, but new when compared to the exam dated 03/07/2021. Left perihilar and mid lung pulmonary anastomosis staples are unchanged. Bilateral  prominence of the interstitial markings is without significant change. Possible small left pleural effusion. No evidence of a pneumothorax. IMPRESSION: 1. No significant change from the previous day's exam. 2. Opacity at the left lung base is likely atelectasis, possibly with an associated small effusion. Discoid type opacity in the right mid lung is consistent with atelectasis or scarring, also stable. No new lung abnormalities. 3. Stable endotracheal tube. Electronically Signed   By: Lajean Manes M.D.   On: 03/21/2021 09:36   DG CHEST PORT 1 VIEW  Result Date: 03/18/2021 CLINICAL DATA:  Hypotension.  History of lung cancer. EXAM: PORTABLE CHEST 1 VIEW COMPARISON:  March 16, 2021. FINDINGS: The heart size and mediastinal contours are within normal limits. Endotracheal tube is in good position. No pneumothorax is noted. Stable right upper opacity is noted. The visualized skeletal structures are unremarkable. IMPRESSION: Stable right upper lobe opacity. Endotracheal tube in good position. Aortic Atherosclerosis (ICD10-I70.0). Electronically Signed   By: Marijo Conception M.D.   On: 04/06/2021 13:28   ECHOCARDIOGRAM COMPLETE  Result  Date: 03/17/2021    ECHOCARDIOGRAM REPORT   Patient Name:   NARIYA NEUMEYER Date of Exam: 03/17/2021 Medical Rec #:  [id number]    Height:       64.0 in Accession #:    [id number]   Weight:       106.9 lb Date of Birth:  February 01, 1935   BSA:          1.499 m Patient Age:    86 years     BP:           133/30 mmHg Patient Gender: F            HR:           78 bpm. Exam Location:  Inpatient Procedure: 2D Echo, Color Doppler and Cardiac Doppler Indications:    I48.91* Unspecified atrial fibrillation  History:        Patient has no prior history of Echocardiogram examinations.                 Lung Cancer.  Sonographer:    Raquel Sarna Senior RDCS Referring Phys: Harris  Sonographer Comments: Technically difficult due to small rib spacing. IMPRESSIONS  1. Left ventricular ejection  fraction, by estimation, is 65 to 70%. The left ventricle has normal function. The left ventricle has no regional wall motion abnormalities. There is moderate left ventricular hypertrophy of the basal-septal segment. Left ventricular diastolic parameters are consistent with Grade I diastolic dysfunction (impaired relaxation). Elevated left ventricular end-diastolic pressure.  2. Right ventricular systolic function is normal. The right ventricular size is normal. There is normal pulmonary artery systolic pressure.  3. The mitral valve is degenerative. Mild mitral valve regurgitation. No evidence of mitral stenosis.  4. The aortic valve is normal in structure. Aortic valve regurgitation is mild. No aortic stenosis is present.  5. The inferior vena cava is normal in size with greater than 50% respiratory variability, suggesting right atrial pressure of 3 mmHg. FINDINGS  Left Ventricle: Left ventricular ejection fraction, by estimation, is 65 to 70%. The left ventricle has normal function. The left ventricle has no regional wall motion abnormalities. The left ventricular internal cavity size was normal in size. There is  moderate left ventricular hypertrophy of the basal-septal segment. Left ventricular diastolic parameters are consistent with Grade I diastolic dysfunction (impaired relaxation). Elevated left ventricular end-diastolic pressure. Right Ventricle: The right ventricular size is normal. No increase in right ventricular wall thickness. Right ventricular systolic function is normal. There is normal pulmonary artery systolic pressure. The tricuspid regurgitant velocity is 2.71 m/s, and  with an assumed right atrial pressure of 3 mmHg, the estimated right ventricular systolic pressure is 26.3 mmHg. Left Atrium: Left atrial size was normal in size. Right Atrium: Right atrial size was normal in size. Pericardium: There is no evidence of pericardial effusion. Mitral Valve: The mitral valve is degenerative in  appearance. There is mild thickening of the mitral valve leaflet(s). There is mild calcification of the mitral valve leaflet(s). Mild mitral annular calcification. Mild mitral valve regurgitation. No evidence of mitral valve stenosis. MV peak gradient, 7.3 mmHg. The mean mitral valve gradient is 4.0 mmHg. Tricuspid Valve: The tricuspid valve is normal in structure. Tricuspid valve regurgitation is mild . No evidence of tricuspid stenosis. Aortic Valve: The aortic valve is normal in structure. Aortic valve regurgitation is mild. No aortic stenosis is present. Pulmonic Valve: The pulmonic valve was normal in structure. Pulmonic valve regurgitation is not visualized. No evidence of pulmonic  stenosis. Aorta: The aortic root is normal in size and structure. Venous: The inferior vena cava is normal in size with greater than 50% respiratory variability, suggesting right atrial pressure of 3 mmHg. IAS/Shunts: No atrial level shunt detected by color flow Doppler.  LEFT VENTRICLE PLAX 2D LVIDd:         2.30 cm  Diastology LVIDs:         1.50 cm  LV e' medial:    4.68 cm/s LV PW:         1.10 cm  LV E/e' medial:  26.5 LV IVS:        1.40 cm  LV e' lateral:   5.98 cm/s LVOT diam:     1.80 cm  LV E/e' lateral: 20.7 LV SV:         79 LV SV Index:   52 LVOT Area:     2.54 cm  RIGHT VENTRICLE RV S prime:     17.70 cm/s TAPSE (M-mode): 2.3 cm LEFT ATRIUM             Index       RIGHT ATRIUM           Index LA diam:        2.50 cm 1.67 cm/m  RA Area:     11.80 cm LA Vol (A2C):   62.5 ml 41.69 ml/m RA Volume:   23.00 ml  15.34 ml/m LA Vol (A4C):   34.0 ml 22.68 ml/m LA Biplane Vol: 49.6 ml 33.08 ml/m  AORTIC VALVE LVOT Vmax:   138.00 cm/s LVOT Vmean:  96.800 cm/s LVOT VTI:    0.309 m  AORTA Ao Root diam: 3.20 cm Ao Asc diam:  3.40 cm MITRAL VALVE                TRICUSPID VALVE MV Area (PHT): 2.44 cm     TR Peak grad:   29.4 mmHg MV Area VTI:   2.28 cm     TR Vmax:        271.00 cm/s MV Peak grad:  7.3 mmHg MV Mean grad:  4.0  mmHg     SHUNTS MV Vmax:       1.35 m/s     Systemic VTI:  0.31 m MV Vmean:      95.4 cm/s    Systemic Diam: 1.80 cm MV Decel Time: 311 msec MV E velocity: 124.00 cm/s MV A velocity: 139.00 cm/s MV E/A ratio:  0.89 Fransico Him MD Electronically signed by Fransico Him MD Signature Date/Time: 03/17/2021/1:06:00 PM    Final    CT Renal Stone Study  Result Date: 02/18/2021 CLINICAL DATA:  Flank pain in an 86 year old female for 1 week, difficulty ambulating. EXAM: CT ABDOMEN AND PELVIS WITHOUT CONTRAST TECHNIQUE: Multidetector CT imaging of the abdomen and pelvis was performed following the standard protocol without IV contrast. COMPARISON:  Chest x-ray and lumbar spine evaluation of the same date, CT of the chest, abdomen and pelvis from 2016. FINDINGS: Lower chest: Large hiatal hernia extends into the chest, greater than 50% of the stomach has herniated into the chest as on previous studies. Three-vessel coronary artery disease. No substantial pericardial effusion. Pulmonary emphysema.  No pleural effusion or consolidation. Hepatobiliary: Liver with smooth contours. No pericholecystic stranding. No gross biliary duct distension. Pancreas: Pancreatic atrophy, no signs of inflammation. Spleen: Normal. Adrenals/Urinary Tract: Post bilateral adrenalectomy. Nephrolithiasis on the LEFT, small intrarenal calculi largest in the lower pole measuring approximately 4 mm. No definite  ureteral calculi, no signs of hydronephrosis or perinephric stranding. There are numerous phleboliths in the pelvis that displays similar distribution and appearance compared to prior imaging. Urinary bladder is collapsed. Stomach/Bowel: Much of the stomach again is herniated into the chest. No acute small bowel process. Fluid-filled small bowel loops of predominantly distal small bowel without substantial distension and without adjacent stranding. Moderate stool in the rectum without perirectal stranding. Colonic diverticulosis throughout the  colon. No signs of colonic inflammation. Appendix not visualized, no secondary signs to suggest appendicitis. Small subcostal abdominal wall laxity contains portion of the descending colon without sign of obstruction or inflammation. Vascular/Lymphatic: Atheromatous plaque of the abdominal aorta without aneurysmal dilation. There is no gastrohepatic or hepatoduodenal ligament lymphadenopathy. No retroperitoneal or mesenteric lymphadenopathy. No pelvic sidewall lymphadenopathy. Reproductive: Cystic lesion LEFT adnexa 3.5 x 2.5 x 3.5 cm Other: No free intra-abdominal air.  No ascites. Musculoskeletal: Osteopenia. Spinal degenerative changes. No acute bone finding or destructive bone process. T12 and L1 with loss of height, chronic and better evaluated on dedicated spinal recons, reported separately. IMPRESSION: No acute intra-abdominal pathology.  Post bilateral adrenalectomy. Cystic lesion LEFT adnexa 3.5 x 2.5 x 3.5 cm. 3.5 cm left adnexal simple-appearing cyst, not adequately characterized. Recommend prompt follow-up with pelvic US, this could be performed on a nonemergent basis. Reference: JACR 2020 Feb;17(2):248-254 Large hiatal hernia as on previous imaging. Nephrolithiasis without signs of hydronephrosis or perinephric stranding. Three-vessel coronary artery disease. Aortic Atherosclerosis (ICD10-I70.0) and Emphysema (ICD10-J43.9). Electronically Signed   By: Zetta Bills M.D.   On: 03/03/2021 15:07   IR PERCUTANEOUS ART THROMBECTOMY/INFUSION INTRACRANIAL INC DIAG ANGIO  Result Date: 03/21/2021 INDICATION: Acute onset of right gaze deviation, non verbalization and left-sided weakness. Occluded right middle cerebral artery dominant superior division on CT angiogram of the head and neck. EXAM: 1. EMERGENT LARGE VESSEL OCCLUSION THROMBOLYSIS (anterior CIRCULATION) COMPARISON:  CT angiogram of the head and neck of 03/29/2021. MEDICATIONS: Ancef 2 g antibiotic was administered within 1 hour of the procedure.  ANESTHESIA/SEDATION: General anesthesia. CONTRAST:  Omnipaque 350 approximately 80 cc. FLUOROSCOPY TIME:  Fluoroscopy Time: 55 minutes 4 seconds (379 mGy). COMPLICATIONS: None immediate. TECHNIQUE: Following a full explanation of the procedure along with the potential associated complications, an informed witnessed consent was obtained. The risks of intracranial hemorrhage of 10%, worsening neurological deficit, ventilator dependency, death and inability to revascularize were all reviewed in detail with the patient's daughter. The patient was then put under general anesthesia by the Department of Anesthesiology at [facility]. The right groin was prepped and draped in the usual sterile fashion. Thereafter using modified Seldinger technique, transfemoral access into the right common femoral artery was obtained without difficulty. Over a 0.035 inch guidewire an 8 [place] Pinnacle 25 cm sheath was inserted. Through this, and also over a 0.035 inch guidewire a 5 [place] JB 2 support catheter inside of an 087 balloon guide catheter, which had been prepped with 50% contrast and 50% heparinized saline infusion was advanced into the proximal right internal carotid artery. The exchange guidewire with support catheter was then removed. Good aspiration obtained from the hub of the balloon guide catheter with the distal end in the proximal right internal carotid artery. A gentle control arteriogram performed through the balloon guide catheter demonstrated no evidence of spasms, dissections or of intraluminal filling defects. Control arteriogram performed centered intracranially was then performed. FINDINGS: An initial innominate arteriogram demonstrates the right subclavian artery to be moderately narrowed proximally. The origin of the right vertebral artery  is normal. The vessel is seen to opacify to the cranial skull base to the right posterior-inferior cerebellar artery. Distal to this the visualized right  vertebrobasilar junction and proximal basilar artery as visualized is grossly patent. The right common carotid arteriogram demonstrates the right external carotid artery and its major branches to be widely patent. The right internal carotid artery at the bulb to the cranial skull base is widely patent. The petrous, the cavernous and the supraclinoid segments are widely patent. A right posterior communicating artery is seen opacifying the right posterior cerebral artery distribution. The right anterior cerebral artery opacifies into the capillary and venous phases. The right middle cerebral artery demonstrates patency of the inferior division into the capillary and venous phases. Angiographic occlusion of a prominent superior division is noted proximally. PROCEDURE: Through the balloon guide catheter in the proximal right internal carotid artery, a combination of an 071 Zoom aspiration catheter inside of which was an 021 162 cm microcatheter was advanced over a 0.014 inch Aristotle micro guidewire with a J-tip configuration to the supraclinoid right ICA. The micro guidewire was then advanced using a torque device to the superior division proximally and then through it to the M2 M3 region followed by the microcatheter. The guidewire was removed. Good aspiration obtained from the hub of the microcatheter. A gentle control arteriogram performed through the microcatheter demonstrated safe position of the tip of the microcatheter. This was then connected to continuous heparinized saline infusion. A 3 mm x 40 mm Solitaire X retrieval device was then advanced to the distal end of the microcatheter and deployed in the usual manner. At this time the 071 Zoom aspiration catheter was advanced just proximal to the origin of the superior division. With proximal flow arrest in the right internal carotid artery, and constant aspiration using a 20 mL syringe at the hub of the balloon guide catheter for 2-1/2 minutes, the combination  of the retrieval device, the microcatheter and the 071 Zoom aspiration catheter were retrieved and removed. Following reversal of flow arrest, a control arteriogram performed through the balloon guide catheter in the right internal carotid artery now demonstrated occlusion of the main trunk of the right middle cerebral artery distally. The anterior cerebral artery circulation and the posterior communicating artery circulation demonstrated wide patency. A second pass was now made at this time using an 055 136 cm Zoom aspiration catheter inside of which was an 021 162 microcatheter over a 0.014 inch standard Synchro micro guidewire with a J configuration. The combination was advanced to the supraclinoid right ICA. Again access through the occluded right middle cerebral artery was obtained with the micro guidewire and advanced into the M2 M3 region of the superior division. The micro guidewire was removed. Good aspiration obtained from the hub of the microcatheter. Gentle control arteriogram performed through the microcatheter demonstrated safe position of the tip of the microcatheter which was then connected to continuous heparinized saline infusion. At this time a 4 mm x 40 mm Solitaire X retrieval device was advanced to the distal end of the microcatheter and deployed in the usual manner by retrieving the microcatheter. The proximal portion of the retrieval device was just proximal to the occluded segment of the right middle cerebral artery. The 055 Zoom aspiration catheter was advanced to the origin of the superior division and connected to continuous heparinized saline infusion. With proximal flow arrest in the right internal carotid artery, constant aspiration was applied at the hub of the balloon guide catheter with a  20 mL syringe, for 3 minutes. Thereafter, a combination of the retrieval device, the microcatheter and Zoom aspiration catheter was retrieved and removed. Using reversal flow arrest, a control  arteriogram performed through the balloon guide in the right internal carotid artery demonstrated a complete revascularization of the right middle cerebral artery distribution achieving a TICI 3 revascularization. Moderate spasm in the proximal MCA branch responded to 225 mcg of nitroglycerin intra-arterially. A final control arteriogram performed through the balloon guide catheter in the proximal right internal carotid artery continued to demonstrate a complete revascularization of the right middle cerebral artery with wide patency of the right anterior and the right posterior communicating arteries. The balloon guide catheter was removed. The 8 [place] Pinnacle sheath was removed with successful hemostasis achieved with an 8 [place] Angio-Seal closure device. Distal pulses remained Dopplerable bilaterally unchanged. CT of the brain performed on the table demonstrated no evidence of intracranial hemorrhage or mass effect. The patient was left intubated on account of preprocedural responsiveness and to protect the airway from aspiration. Patient was then transferred to the neuro ICU for post thrombectomy management. IMPRESSION: Status post endovascular complete revascularization of occluded right middle cerebral artery M1 segment with 1 pass with a 3 mm x 40 mm Solitaire X retrieval device and aspiration, and a 4 mm x 40 mm Solitaire X retrieval device with proximal aspiration achieving a TICI 3 revascularization. PLAN: Follow-up as per referring MD. Electronically Signed   By: Luanne Bras M.D.   On: 03/21/2021 08:44   CT HEAD CODE STROKE WO CONTRAST  Addendum Date: 04/09/2021   ADDENDUM REPORT: 04/02/2021 08:41 ADDENDUM: Correction, RIGHT MCA territory ischemia with right side ASPECTS 7. Study was discussed by telephone with Dr. Irine Seal on 03/27/2021 at 0836 hours, and also with Dr. Rory Percy via text message at [phone]. Electronically Signed   By: Genevie Ann M.D.   On: 03/30/2021 08:41   Result Date:  03/26/2021 CLINICAL DATA:  Code stroke.  86 year old female. EXAM: CT HEAD WITHOUT CONTRAST TECHNIQUE: Contiguous axial images were obtained from the base of the skull through the vertex without intravenous contrast. COMPARISON:  Brain MRI 12/25/2011. FINDINGS: Brain: Cerebral volume is not significantly changed since 2013. No midline shift, mass effect, or evidence of intracranial mass lesion. Cytotoxic edema in the right frontal lobe (series 4, image 24) also involving the right operculum and insula. No acute hemorrhage identified. Normal basilar cisterns. Underlying patchy in chronic bilateral white matter hypodensity. New since 2013 but chronic appearing bilateral thalamic lacunar infarcts. No ventriculomegaly. Vascular: Mild Calcified atherosclerosis at the skull base. No suspicious intracranial vascular hyperdensity. Skull: Mild motion artifact. No acute osseous abnormality identified. Sinuses/Orbits: Visualized paranasal sinuses and mastoids are clear. Other: Rightward gaze. Postoperative changes to both globes. No acute scalp soft tissue finding. ASPECTS [facility] Stroke Program Early CT Score) Total score (0-10 with 10 being normal): 7 (abnormal right M4, M 5, and insula segments). IMPRESSION: 1. Positive for left MCA territory cytotoxic edema compatible with acute or subacute infarct. ASPECTS 7. No associated hemorrhage or mass effect. 2. Evidence of advanced underlying small vessel disease, progressed since 2013 in the bilateral thalami. Electronically Signed: By: Genevie Ann M.D. On: 04/06/2021 08:29   CT ANGIO HEAD NECK W WO CM W PERF (CODE STROKE)  Result Date: 03/17/2021 CLINICAL DATA:  86 year old female code stroke presentation with right MCA ischemia on plain head CT. History of lung cancer. EXAM: CT ANGIOGRAPHY HEAD AND NECK CT PERFUSION BRAIN TECHNIQUE: Multidetector CT imaging  of the head and neck was performed using the standard protocol during bolus administration of intravenous contrast.  Multiplanar CT image reconstructions and MIPs were obtained to evaluate the vascular anatomy. Carotid stenosis measurements (when applicable) are obtained utilizing NASCET criteria, using the distal internal carotid diameter as the denominator. Multiphase CT imaging of the brain was performed following IV bolus contrast injection. Subsequent parametric perfusion maps were calculated using RAPID software. CONTRAST:  42mL OMNIPAQUE IOHEXOL 350 MG/ML SOLN COMPARISON:  Plain head CT 0821 hours today. Chest CT 05/09/2020 and earlier. FINDINGS: CT Brain Perfusion Findings: ASPECTS: 7 CBF (<30%) Volume: 42mL Perfusion (Tmax>6.0s) volume: 16mL - although some of this is in the inferior left temporal lobe and appears artifactual. Also, hypoperfusion index is 0.6, with 18 mL of T-max greater than 10. Mismatch Volume: Less than 69mL Infarction Location:Right MCA CTA NECK Skeleton: Torus palatinus, normal variant. Widespread cervical spine degeneration. No acute osseous abnormality identified. Upper chest: Small layering right pleural effusion is new from last year. Confluent right perihilar scarring and atelectasis with underlying centrilobular emphysema, previous partial left pneumonectomy. Small but increased precarinal lymph node on series 5, image 182 measures 10 mm short axis now (previously 7 mm) and there is questionable increased right hilar soft tissue on series 5, image 186. Other neck: Small volume retained secretions in the pharynx. Otherwise negative. Aortic arch: 3 vessel arch configuration with moderate to severe calcified arch atherosclerosis. Right carotid system: Brachiocephalic artery plaque without significant stenosis. Negative right CCA origin. Calcified plaque at the right carotid bifurcation, right ICA origin with less than 50 % stenosis with respect to the distal vessel. The right ICA is mildly tortuous and patent to the skull base. Left carotid system: Mild calcified plaque at the left CCA origin and  left carotid bifurcation without stenosis. Tortuous left ICA below the skull base. Vertebral arteries: Calcified plaque in the proximal right subclavian artery with less than 50% stenosis. Normal right vertebral artery origin. Tortuous right vertebral artery appears patent to the skull base without stenosis. Moderate atherosclerosis in the proximal left subclavian artery without significant stenosis. Left vertebral artery origin is spared. Left vertebral artery is fairly codominant and tortuous, patent to the V3 segment without stenosis. There is mild irregularity in the distal V3 segment and at the skull base without significant stenosis. CTA HEAD Posterior circulation: Tortuous distal vertebral arteries, the left is mildly dominant. Patent right PICA origin. Patent vertebrobasilar junction without stenosis. Patent basilar artery without stenosis. SCA and left PCA origin are patent. Fetal type right PCA origin is patent. Bilateral PCA branches are within normal limits. Anterior circulation: Both ICA siphons are patent. Minimal ICA siphon plaque and no siphon stenosis. Early cavernous sinus enhancement. Normal right posterior communicating artery origin. Patent carotid termini. Patent MCA and ACA origins. Diminutive anterior communicating artery. Bilateral ACA branches, left MCA M1 segment, left MCA bifurcation, and left MCA branches are within normal limits. Right MCA M1 segment and trifurcation are patent but there is a right MCA M2 branch occlusion on series 9, image 37, series 12, image 9 at the middle sylvian division. Mild irregularity of the other enhancing right MCA branches. Venous sinuses: Patent. Anatomic variants: Mildly dominant left vertebral artery V4 segment. Review of the MIP images confirms the above findings IMPRESSION: 1. Positive for ELVO: Right MCA M2 branch occlusion, middle Sylvian division. 2. CT Perfusion detects a an infarct core of 15 mL which appears fairly concordant to the ASPECTS of  7. However, some of the 32  mL penumbra is artifactual (in the left temporal lobe). And Hypoperfusion Index is 0.6. 3. Right CCA and carotid bifurcation atherosclerosis without significant stenosis. Mild atherosclerosis elsewhere in the head and neck. Aortic Atherosclerosis (ICD10-I70.0). 4. Emphysema (ICD10-J43.9) and posttreatment changes to both visible lungs. Small but increased anterior carina mediastinal lymph node since last year, with questionable increased right hilar nodal tissue raising the possibility of active right lung cancer. Small layering right pleural effusion. Salient findings were discussed with Dr. Rory Percy beginning at 0839 hours on 04/13/2021 by text messaging. Electronically Signed   By: Genevie Ann M.D.   On: 03/30/2021 09:01     PHYSICAL EXAM  Temp:  [97.1 F (36.2 C)-100.8 F (38.2 C)] 99.3 F (37.4 C) (10/05 0400) Pulse Rate:  [69-139] 100 (10/05 1130) Resp:  [15-33] 16 (10/05 1130) BP: (68-168)/(40-94) 141/56 (10/05 1130) SpO2:  [92 %-100 %] 92 % (10/05 1130) FiO2 (%):  [40 %] 40 % (10/05 0815) Weight:  [57.3 kg] 57.3 kg (10/05 0440)  General - Well nourished, well developed, in mild distress due to pain.  Ophthalmologic - fundi not visualized due to noncooperation.  Cardiovascular - Regular rhythm and rate, not in A. fib now.  Neuro - awake, eyes open, nonverbal, not answer questions, not following commands.  Right gaze palsy, not cross midline, blinking to visual threat on the right but not on the left.  Left facial droop.  Tongue protrusion not corporative.  Actively moving right upper extremity against gravity, withdraw to pain on the left lower extremity 3/5, left upper extremity flaccid, left lower extremity 2/5 on pain summation. Sensation, coordination and gait not tested.   ASSESSMENT/PLAN Ms. Mindee Robledo is a 86 y.o. female with history of severe back pain, remote lung cancer with questionable recurrence, remote bilateral adrenal cortex tumor, recent  diagnosis of A. fib not on AC, recent pneumonia under treatment, non-STEMI admitted for left-sided weakness, left neglect, left facial droop, right gaze and difficulty talking. No tPA given due to also window.    Stroke:  right MCA infarct embolic likely secondary to A. fib not on [facility] CT head right MCA infarct, aspect score 7 CTA head and neck right M2 occlusion, questionable right lung cancer recurrence lymph node enlargement MRI not performed due to family request to comfort care measures CT perfusion 15/32 Status post IR with TICI3 2D Echo EF 65 to 70% LDL 30 HgbA1c 5.1 SCDs for VTE prophylaxis No antithrombotic prior to admission, now on No antithrombotic.  Family request comfort care measures. Disposition: Daughter requested comfort care measures.  PAF Diagnosed recently Not on AC yet Rate controlled Currently in sinus  Hypotension Stable On home Florinef On neo  BP goal 1 20-1 40 within 24 hours of IR Long term BP goal normotensive  Hyperlipidemia Home meds: Lipitor 10 LDL 30, goal < 70  Dysphagia Not able to pass a swallow Family refused core track Family requested comfort care measures  Other Stroke Risk Factors Advanced age  Other Active Problems Severe lower back pain on home gabapentin Remote lung cancer with questionable recurrence Remote bilateral adrenal cortex tumor  Hospital day # 5  This patient is critically ill due to acute right MCA stroke status post thrombectomy, PAF, hypotension and at significant risk of neurological worsening, death form recurrent stroke, heart failure, hypotension, hemorrhagic conversion. This patient's care requires constant monitoring of vital signs, hemodynamics, respiratory and cardiac monitoring, review of multiple databases, neurological assessment, discussion with family, other specialists and medical decision making of  high complexity. I spent 45 minutes of neurocritical care time in the care of this patient. I had long  discussion with daughter and a friend at bedside, updated pt current condition, treatment plan and potential prognosis, and answered all the questions.  They expressed understanding and appreciation.    Rosalin Hawking, MD PhD Stroke Neurology 03/21/2021 12:02 PM    To contact Stroke Continuity provider, please refer to [email or web address] After hours, contact General Neurology

## 2021-03-21 NOTE — Progress Notes (Signed)
Pt with very sensitive with BP pressures, having to titrate both Levo and Neo numerous times throughout the night. Pt quite agitated, encouraged family to do their best to allow pt rest. Precedex also titrated based on pt need. Pt has episode of afib rvr around 0030, received new order for Amio bolus, pt eventually converted back into nsr. Continued to titrate Levo and Neo numerous times, did have a decent length of time with BP within desired parameters starting sometime around 0230.

## 2021-03-21 NOTE — Progress Notes (Signed)
NAME:  Leslie Petersen, MRN:  [id number], DOB:  Nov 04, 1934, LOS: 5 ADMISSION DATE:  02/23/2021, CONSULTATION DATE: 03/19/2021 REFERRING MD: Interventional neurologist, CHIEF COMPLAINT: Code stroke with a right MCA clot retrieval on 03/30/2021  History of Present Illness:  86 year old female who is listed as a DNR and was admitted to [facility] long hospital 930 for suspected community-acquired pneumonia treated Unasyn.  Had a code stroke called 04/06/2021 and was transferred from [facility] to [facility] with urgent right MCA clot retrieval per neurological intervention team.  She is remaing intubated overnight and will be evaluated in a.m. for possible extubation note she has extensive past medical history is well-documented below she currently has advanced cortical tumors which have been resected adenocarcinoma of the lung resected she is followed by Dr. Geanie Cooley of oncology and is currently in remission to be followed up next month December 2020.  Pulmonary critical care team to follow-up with medical management.  Significant Hospital Events: Including procedures, antibiotic start and stop dates in addition to other pertinent events   04/01/2021 right MCA clot retrieval 10/5: Successfully extubated  Interim History / Subjective:  Patient was agitated last night, her blood pressure was fluctuating, requiring IV vasopressor with phenylephrine and Levophed Tolerated spontaneous breathing trial, was successfully extubated  Objective   Blood pressure (!) 140/59, pulse 98, temperature 99.3 F (37.4 C), temperature source Axillary, resp. rate 17, height 5\' 4"  (1.626 m), weight 57.3 kg, SpO2 99 %.    Vent Mode: PSV;CPAP FiO2 (%):  [40 %-50 %] 40 % Set Rate:  [12 bmp-16 bmp] 16 bmp Vt Set:  [430 mL] 430 mL PEEP:  [5 cmH20] 5 cmH20 Pressure Support:  [10 cmH20] 10 cmH20 Plateau Pressure:  [12 cmH20-23 cmH20] 12 cmH20   Intake/Output Summary (Last 24 hours) at 03/21/2021 1056 Last  data filed at 03/21/2021 0900 Gross per 24 hour  Intake 6403.44 ml  Output 2000 ml  Net 4403.44 ml   Filed Weights   03/21/2021 0500 04/03/2021 1145 03/21/21 0440  Weight: 58.3 kg 52.2 kg 57.3 kg    Examination: General: Chronically ill looking elderly Caucasian female, lying on the bed HENT: Atraumatic, normocephalic no JVD or lymphadenopathy is appreciated Lungs: Diminished in the bases, otherwise clear to auscultation bilaterally Cardiovascular: Currently in sinus rhythm heart sounds regular Abdomen: Soft nontender positive bowel sounds Extremities: Multiple areas of ecchymosis on extremities Neuro: Alert, awake, following commands, neglecting left side with flicker movement on left upper and lower extremity, antigravity on right side Skin: No rash  Resolved Hospital Problem list     Assessment & Plan:  Acute right MCA territory stroke s/p thrombectomy Adrenal insufficiency post adrenalectomy due to adrenal tumor Acute respiratory insufficiency postprocedure, successfully extubated Adenocarcinoma of lung s/p resection Community-acquired pneumonia Paroxysmal A. fib with rapid ventricular response Hypokalemia/hypomagnesemia Recurrent hypoglycemia  Patient continued to have left-sided weakness and neglect Continue neuro watch every hour Continue secondary stroke prophylaxis MRI brain is pending Continue stress dose steroid with hydrocortisone 100 mg twice daily and fludrocortisone Patient is still requiring vasopressor support, likely due to adrenal insufficiency with acute illness Once blood pressure improves, will switch her IV hydrocortisone to her home regimen Outpatient follow-up with oncology Continue Unasyn to complete 7 days therapy Patient went to A. fib with RVR last night again, requiring IV amiodarone bolus Currently in sinus rhythm Continue metoprolol Aggressively supplement electrolytes Hypoglycemia has resolved Patient was successfully extubated this  morning Continue PT/OT and swallow evaluation  Renal insuff Lab  Results  Component Value Date   CREATININE 0.80 03/21/2021   CREATININE 0.50 03/28/2021   CREATININE 0.62 03/19/2021   CREATININE 0.7 08/17/2015   CREATININE 0.7 01/25/2015   CREATININE 0.6 09/28/2014  Monitor  Adrenal insuff Check cortisol   Best Practice (right click and "Reselect all SmartList Selections" daily)   Diet/type: NPO DVT prophylaxis: not indicated GI prophylaxis: PPI Lines: N/A Foley:  N/A Code Status:  DNR Last date of multidisciplinary goals of care discussion t Labs   CBC: Recent Labs  Lab 03/05/2021 1416 03/17/21 0312 03/18/21 0318 03/19/21 0315 03/17/2021 0456 03/21/21 0309 03/21/21 0439  WBC 8.4 6.4 6.1 6.4 9.1 13.8*  --   NEUTROABS 4.9 4.4 4.0  --  6.5  --   --   HGB 10.6* 9.8* 10.0* 9.2* 10.1* 8.4* 7.8*  HCT 32.7* 30.0* 29.5* 27.8* 29.2* 25.9* 23.0*  MCV 112.0* 110.7* 109.3* 109.9* 106.6* 110.2*  --   PLT 147* 114* 119* 123* 130* 187  --     Basic Metabolic Panel: Recent Labs  Lab 03/02/2021 1915 03/17/21 0312 03/18/21 0318 03/19/21 0315 04/10/2021 0456 03/21/21 0309 03/21/21 0439  NA  --  141 145 142 141 140 144  K  --  4.1 3.1* 3.5 3.2* 3.1* 3.0*  CL  --  112* 114* 111 110 110  --   CO2  --  25 22 25 26  19*  --   GLUCOSE  --  87 82 99 81 139*  --   BUN  --  14 12 14 11 9   --   CREATININE  --  0.58 0.54 0.62 0.50 0.80  --   CALCIUM  --  7.7* 8.2* 8.1* 8.2* 7.0*  --   MG 1.7 2.3 2.0 2.2  --  1.4*  --   PHOS  --   --   --   --   --  4.3  --    GFR: Estimated Creatinine Clearance: 46 mL/min (by C-G formula based on SCr of 0.8 mg/dL). Recent Labs  Lab 03/07/2021 1416 03/09/2021 1549 03/15/2021 1915 03/17/21 0312 03/18/21 0318 03/19/21 0315 04/09/2021 0456 03/21/21 0309  PROCALCITON  --   --  <0.10 <0.10  --   --   --   --   WBC 8.4  --   --  6.4 6.1 6.4 9.1 13.8*  LATICACIDVEN 2.4* 1.3  --  1.0  --   --   --   --     Liver Function Tests: Recent Labs  Lab  02/18/2021 1416  AST 36  ALT 21  ALKPHOS 48  BILITOT 1.2  PROT 6.2*  ALBUMIN 2.9*   No results for input(s): LIPASE, AMYLASE in the last 168 hours. No results for input(s): AMMONIA in the last 168 hours.  ABG    Component Value Date/Time   PHART 7.429 03/21/2021 0439   PCO2ART 32.6 03/21/2021 0439   PO2ART 169 (H) 03/21/2021 0439   HCO3 21.5 03/21/2021 0439   TCO2 23 03/21/2021 0439   ACIDBASEDEF 2.0 03/21/2021 0439   O2SAT 100.0 03/21/2021 0439     Coagulation Profile: Recent Labs  Lab 02/24/2021 1416  INR 1.3*    Cardiac Enzymes: No results for input(s): CKTOTAL, CKMB, CKMBINDEX, TROPONINI in the last 168 hours.  HbA1C: Hgb A1c MFr Bld  Date/Time Value Ref Range Status  04/14/2021 04:56 AM 5.1 4.8 - 5.6 % Final    Comment:    (NOTE) Pre diabetes:  5.7%-6.4%  Diabetes:              >6.4%  Glycemic control for   <7.0% adults with diabetes     CBG: Recent Labs  Lab 03/25/2021 1528 04/09/2021 1921 03/25/2021 2321 03/21/21 0314 03/21/21 0755  GLUCAP 74 112* 113* 145* 146*   Total critical care time: 42 minutes  Performed by: [place] care time was exclusive of separately billable procedures and treating other patients.   Critical care was necessary to treat or prevent imminent or life-threatening deterioration.   Critical care was time spent personally by me on the following activities: development of treatment plan with patient and/or surrogate as well as nursing, discussions with consultants, evaluation of patient's response to treatment, examination of patient, obtaining history from patient or surrogate, ordering and performing treatments and interventions, ordering and review of laboratory studies, ordering and review of radiographic studies, pulse oximetry and re-evaluation of patient's condition.   Jacky Kindle MD [facility] See Amion for pager If no response to pager, please call [phone] until 7pm After  7pm, Please call E-link [phone]

## 2021-03-21 NOTE — Procedures (Signed)
Extubation Procedure Note  Patient Details:   Name: [name] DOB: Sep 01, 1934 MRN: [id number]   Airway Documentation:    Vent end date: (not recorded) Vent end time: (not recorded)   Evaluation  O2 sats: stable throughout Complications: No apparent complications Patient did tolerate procedure well. Bilateral Breath Sounds: (P) Clear, Diminished   No  Pt extubated per MD order with RN at bedside, pt placed on 4L [place]. Positive cuff leak noted, no stridor heard. RT will continue to monitor.  Tobi Bastos 03/21/2021, 8:16 AM

## 2021-03-21 NOTE — Progress Notes (Signed)
SLP Cancellation Note  Patient Details Name: Leslie Petersen MRN: [id number] DOB: 11/03/1934   Cancelled treatment:        Reason eval/treat not completed: Pt not medically ready; orally intubated. SLP will continue to f/u for readiness.   Dewitt Rota, SLP-Student    [place] Date 03/21/2021, 7:43 AM

## 2021-03-21 NOTE — Progress Notes (Signed)
eLink Physician-Brief Progress Note Patient Name: Leslie Petersen DOB: 25-Apr-1935 MRN: [id number]   Date of Service  03/21/2021  HPI/Events of Note  Patient went into atrial fibrillation with RVR (rate as high as 180), requiring increase in gtt of vasopressor.  eICU Interventions  Amiodarone 150 mg iv x 1 ordered.        Leslie Petersen 03/21/2021, 1:23 AM

## 2021-03-21 NOTE — TOC CAGE-AID Note (Signed)
Transition of Care [facility]) - CAGE-AID Screening   Patient Details  Name: Leslie Petersen MRN: [id number] Date of Birth: 27-Apr-1935  Transition of Care [facility]) CM/SW Contact:    Bland Rudzinski C Tarpley-Carter, [place] Phone Number: 03/21/2021, 1:07 PM   Clinical Narrative: Pt is unable to participate in Cage Aid.   Pt is critically ill.  [name], MSW, LCSW-A Pronouns:  She/Her/Hers [facility] HealthTransitions of Care Clinical Social Worker Direct Number:  [phone] [email or web address]   CAGE-AID Screening: Substance Abuse Screening unable to be completed due to: : Patient unable to participate (Pt is critically ill.)             Substance Abuse Education Offered: No

## 2021-03-21 NOTE — Progress Notes (Signed)
OT Cancellation Note/Discharge  Patient Details Name: Aidel Davisson MRN: [id number] DOB: 1934-10-07   Cancelled Treatment:    Reason Eval/Treat Not Completed: Other (comment) Comfort Care measures  Maury Groninger,HILLARY 03/21/2021, 11:57 AM Maurie Boettcher, OT/L   Acute OT Clinical Specialist Acute Rehabilitation Services Pager [phone] Office [phone]

## 2021-03-21 NOTE — Progress Notes (Signed)
PT Cancellation Note  Patient Details Name: Leslie Petersen MRN: [id number] DOB: [birth year]   Cancelled Treatment:    Reason Eval/Treat Not Completed: Active bedrest order Will re-attempt once activity orders updated.   Dashaun Onstott A. Gilford Rile PT, DPT Acute Rehabilitation Services Pager [phone] Office [phone]   Linna Hoff 03/21/2021, 7:44 AM

## 2021-03-23 LAB — CULTURE, RESPIRATORY W GRAM STAIN: Gram Stain: NONE SEEN

## 2021-04-17 NOTE — Progress Notes (Signed)
This RN wasted 10cc of Morphine infusion gtts in stericycle with Gypsy Lore, RN.

## 2021-04-17 NOTE — Progress Notes (Signed)
Pt DNR , no pulse, bilateral lung sounds auscultated- no sounds heard. Time of death 40, pronounced by Gypsy Lore, RN and Ny' Cora Daniels, RN. Family called and made aware of pts condition. Daughter, [name] stated that her and family will arrive soon. Physician on call notified, Bruce Donath, MD. [facility] notified. Patient belongings given to family.

## 2021-04-17 NOTE — Death Summary Note (Signed)
DEATH SUMMARY   Patient Details  Name: Leslie Petersen MRN: [id number] DOB: 12-13-1934  Admission/Discharge Information   Admit Date:  2021/03/19  Date of Death: Date of Death: 2021-03-25  Time of Death: Time of Death: 0454  Length of Stay: August 24, 2022  Referring Physician: Kathyrn Lass, MD   Reason(s) for Hospitalization  stroke  Diagnoses  Preliminary cause of death:  Stroke, Middle cerebral artery embolism, right CAP (community acquired pneumonia) Secondary Diagnoses (including complications and co-morbidities):    New onset atrial fibrillation ([place])   Non-ST elevation (NSTEMI) myocardial infarction ([place])   Hypotension   Adrenocortical carcinoma ([place])   Osteoporosis   Chronic back pain   Hypokalemia   Anemia   Adnexal cyst   Brief Hospital Course (including significant findings, care, treatment, and services provided and events leading to death)  [name] is a 86 y.o. year old female with history of severe back pain, remote lung cancer with questionable recurrence, remote bilateral adrenal cortex tumor, recent diagnosis of A. fib not on AC, recent pneumonia under treatment, non-STEMI admitted for left-sided weakness, left neglect, left facial droop, right gaze and difficulty talking. No tPA given due to also window.     Stroke:  right MCA infarct embolic likely secondary to A. fib not on [facility] CT head right MCA infarct, aspect score 7 CTA head and neck right M2 occlusion, questionable right lung cancer recurrence lymph node enlargement MRI not performed due to family request to comfort care measures CT perfusion 15/32 Status post IR with TICI3 2D Echo EF 65 to 70% LDL 30 HgbA1c 5.1 SCDs for VTE prophylaxis No antithrombotic prior to admission, family request comfort care measures post extubation given poor prognosis. Disposition: Daughter requested comfort care measures, and pt passed away on March 25, 2021 at 0454.   PAF Diagnosed recently Not on AC  Rate controlled Was back in  sinus   Hypotension Stable Was On home Florinef Was On neo    Hyperlipidemia Home meds: Lipitor 10 LDL 30    Dysphagia Was not able to pass a swallow Family refused core track Family requested comfort care measures   Other Stroke Risk Factors Advanced age   Other Active Problems Severe lower back pain on home gabapentin Remote lung cancer with questionable recurrence Remote bilateral adrenal cortex tumor    Pertinent Labs and Studies  Significant Diagnostic Studies DG Chest 1 View  Result Date: Mar 19, 2021 CLINICAL DATA:  Weakness and right flank pain. EXAM: CHEST  1 VIEW COMPARISON:  Chest x-ray 02/06/2015.  CT chest 05/09/2020. FINDINGS: Postsurgical changes in the left upper lobe appear stable. There is increasing right upper lobe/perihilar airspace disease. There is a band of opacity in the right upper lobe similar to the prior study. A large hiatal hernia is again seen. There are atherosclerotic calcifications of the aorta. Heart size is within normal limits. The costophrenic angles are clear. There is no pneumothorax. No acute fractures are seen. IMPRESSION: 1. New right upper lobe/perihilar airspace disease. Findings may be related to infection. Other etiologies such as neoplasm cannot be excluded given patient's history. 2. Stable postsurgical changes in the left lung. Electronically Signed   By: Ronney Asters M.D.   On: 2021-03-19 15:06   IR CT Head Ltd  Result Date: 03/21/2021 INDICATION: Acute onset of right gaze deviation, non verbalization and left-sided weakness. Occluded right middle cerebral artery dominant superior division on CT angiogram of the head and neck. EXAM: 1. EMERGENT LARGE VESSEL OCCLUSION THROMBOLYSIS (anterior CIRCULATION) COMPARISON:  CT angiogram  of the head and neck of 04/07/2021. MEDICATIONS: Ancef 2 g antibiotic was administered within 1 hour of the procedure. ANESTHESIA/SEDATION: General anesthesia. CONTRAST:  Omnipaque 350 approximately 80 cc.  FLUOROSCOPY TIME:  Fluoroscopy Time: 55 minutes 4 seconds (379 mGy). COMPLICATIONS: None immediate. TECHNIQUE: Following a full explanation of the procedure along with the potential associated complications, an informed witnessed consent was obtained. The risks of intracranial hemorrhage of 10%, worsening neurological deficit, ventilator dependency, death and inability to revascularize were all reviewed in detail with the patient's daughter. The patient was then put under general anesthesia by the Department of Anesthesiology at [facility]. The right groin was prepped and draped in the usual sterile fashion. Thereafter using modified Seldinger technique, transfemoral access into the right common femoral artery was obtained without difficulty. Over a 0.035 inch guidewire an 8 [place] Pinnacle 25 cm sheath was inserted. Through this, and also over a 0.035 inch guidewire a 5 [place] JB 2 support catheter inside of an 087 balloon guide catheter, which had been prepped with 50% contrast and 50% heparinized saline infusion was advanced into the proximal right internal carotid artery. The exchange guidewire with support catheter was then removed. Good aspiration obtained from the hub of the balloon guide catheter with the distal end in the proximal right internal carotid artery. A gentle control arteriogram performed through the balloon guide catheter demonstrated no evidence of spasms, dissections or of intraluminal filling defects. Control arteriogram performed centered intracranially was then performed. FINDINGS: An initial innominate arteriogram demonstrates the right subclavian artery to be moderately narrowed proximally. The origin of the right vertebral artery is normal. The vessel is seen to opacify to the cranial skull base to the right posterior-inferior cerebellar artery. Distal to this the visualized right vertebrobasilar junction and proximal basilar artery as visualized is grossly patent. The right  common carotid arteriogram demonstrates the right external carotid artery and its major branches to be widely patent. The right internal carotid artery at the bulb to the cranial skull base is widely patent. The petrous, the cavernous and the supraclinoid segments are widely patent. A right posterior communicating artery is seen opacifying the right posterior cerebral artery distribution. The right anterior cerebral artery opacifies into the capillary and venous phases. The right middle cerebral artery demonstrates patency of the inferior division into the capillary and venous phases. Angiographic occlusion of a prominent superior division is noted proximally. PROCEDURE: Through the balloon guide catheter in the proximal right internal carotid artery, a combination of an 071 Zoom aspiration catheter inside of which was an 021 162 cm microcatheter was advanced over a 0.014 inch Aristotle micro guidewire with a J-tip configuration to the supraclinoid right ICA. The micro guidewire was then advanced using a torque device to the superior division proximally and then through it to the M2 M3 region followed by the microcatheter. The guidewire was removed. Good aspiration obtained from the hub of the microcatheter. A gentle control arteriogram performed through the microcatheter demonstrated safe position of the tip of the microcatheter. This was then connected to continuous heparinized saline infusion. A 3 mm x 40 mm Solitaire X retrieval device was then advanced to the distal end of the microcatheter and deployed in the usual manner. At this time the 071 Zoom aspiration catheter was advanced just proximal to the origin of the superior division. With proximal flow arrest in the right internal carotid artery, and constant aspiration using a 20 mL syringe at the hub of the balloon guide catheter for  2-1/2 minutes, the combination of the retrieval device, the microcatheter and the 071 Zoom aspiration catheter were retrieved  and removed. Following reversal of flow arrest, a control arteriogram performed through the balloon guide catheter in the right internal carotid artery now demonstrated occlusion of the main trunk of the right middle cerebral artery distally. The anterior cerebral artery circulation and the posterior communicating artery circulation demonstrated wide patency. A second pass was now made at this time using an 055 136 cm Zoom aspiration catheter inside of which was an 021 162 microcatheter over a 0.014 inch standard Synchro micro guidewire with a J configuration. The combination was advanced to the supraclinoid right ICA. Again access through the occluded right middle cerebral artery was obtained with the micro guidewire and advanced into the M2 M3 region of the superior division. The micro guidewire was removed. Good aspiration obtained from the hub of the microcatheter. Gentle control arteriogram performed through the microcatheter demonstrated safe position of the tip of the microcatheter which was then connected to continuous heparinized saline infusion. At this time a 4 mm x 40 mm Solitaire X retrieval device was advanced to the distal end of the microcatheter and deployed in the usual manner by retrieving the microcatheter. The proximal portion of the retrieval device was just proximal to the occluded segment of the right middle cerebral artery. The 055 Zoom aspiration catheter was advanced to the origin of the superior division and connected to continuous heparinized saline infusion. With proximal flow arrest in the right internal carotid artery, constant aspiration was applied at the hub of the balloon guide catheter with a 20 mL syringe, for 3 minutes. Thereafter, a combination of the retrieval device, the microcatheter and Zoom aspiration catheter was retrieved and removed. Using reversal flow arrest, a control arteriogram performed through the balloon guide in the right internal carotid artery demonstrated a  complete revascularization of the right middle cerebral artery distribution achieving a TICI 3 revascularization. Moderate spasm in the proximal MCA branch responded to 225 mcg of nitroglycerin intra-arterially. A final control arteriogram performed through the balloon guide catheter in the proximal right internal carotid artery continued to demonstrate a complete revascularization of the right middle cerebral artery with wide patency of the right anterior and the right posterior communicating arteries. The balloon guide catheter was removed. The 8 [place] Pinnacle sheath was removed with successful hemostasis achieved with an 8 [place] Angio-Seal closure device. Distal pulses remained Dopplerable bilaterally unchanged. CT of the brain performed on the table demonstrated no evidence of intracranial hemorrhage or mass effect. The patient was left intubated on account of preprocedural responsiveness and to protect the airway from aspiration. Patient was then transferred to the neuro ICU for post thrombectomy management. IMPRESSION: Status post endovascular complete revascularization of occluded right middle cerebral artery M1 segment with 1 pass with a 3 mm x 40 mm Solitaire X retrieval device and aspiration, and a 4 mm x 40 mm Solitaire X retrieval device with proximal aspiration achieving a TICI 3 revascularization. PLAN: Follow-up as per referring MD. Electronically Signed   By: Luanne Bras M.D.   On: 03/21/2021 08:44   CT L-SPINE NO CHARGE  Result Date: 03/03/2021 CLINICAL DATA:  Back pain. Worsening weakness for 1 week with right flank pain. EXAM: CT LUMBAR SPINE WITHOUT CONTRAST TECHNIQUE: Multidetector CT imaging of the lumbar spine was performed without intravenous contrast administration. Multiplanar CT image reconstructions were also generated. COMPARISON:  Lumbar spine MRI 09/05/2012 FINDINGS: Segmentation: The lowest fully formed intervertebral  disc space is L5-S1. There are no ribs at T12.  Alignment: Chronic straightening of the normal lumbar lordosis. No listhesis. Vertebrae: Unchanged chronic T12 and L1 compression fractures. No acute fracture or suspicious osseous lesion. Scattered small Schmorl's nodes. Diffuse osteopenia. Paraspinal and other soft tissues: No acute abnormality identified in the paraspinal soft tissues. Intra-and pelvic contents reported separately. Disc levels: Similar appearance of diffuse lumbar disc and facet degeneration compared to the prior MRI, most notable at L4-5 where there is moderate spinal stenosis, moderate bilateral lateral recess stenosis, and moderate right neural foraminal stenosis. Unchanged moderate left greater than right neural foraminal stenosis at L5-S1. IMPRESSION: 1. No acute osseous abnormality identified in the lumbar spine. 2. Unchanged chronic T12 and L1 compression fractures. 3. Unchanged lumbar disc and facet degeneration. Electronically Signed   By: Logan Bores M.D.   On: 03/11/2021 14:43   DG Chest Port 1 View  Result Date: 03/21/2021 CLINICAL DATA:  Follow-up exam. Lung carcinoma. Respiratory distress. EXAM: PORTABLE CHEST 1 VIEW COMPARISON:  04/06/2021 and earlier studies. FINDINGS: Endotracheal tube is stable, tip at the clavicular heads, 5 cm above the carina. Discoid type opacity in the right mid to upper lung is unchanged. There is opacity at the left lung base, retrocardiac, silhouetting the hemidiaphragm, also stable, but new when compared to the exam dated 03/08/2021. Left perihilar and mid lung pulmonary anastomosis staples are unchanged. Bilateral prominence of the interstitial markings is without significant change. Possible small left pleural effusion. No evidence of a pneumothorax. IMPRESSION: 1. No significant change from the previous day's exam. 2. Opacity at the left lung base is likely atelectasis, possibly with an associated small effusion. Discoid type opacity in the right mid lung is consistent with atelectasis or  scarring, also stable. No new lung abnormalities. 3. Stable endotracheal tube. Electronically Signed   By: Lajean Manes M.D.   On: 03/21/2021 09:36   DG CHEST PORT 1 VIEW  Result Date: 03/21/2021 CLINICAL DATA:  Hypotension.  History of lung cancer. EXAM: PORTABLE CHEST 1 VIEW COMPARISON:  March 16, 2021. FINDINGS: The heart size and mediastinal contours are within normal limits. Endotracheal tube is in good position. No pneumothorax is noted. Stable right upper opacity is noted. The visualized skeletal structures are unremarkable. IMPRESSION: Stable right upper lobe opacity. Endotracheal tube in good position. Aortic Atherosclerosis (ICD10-I70.0). Electronically Signed   By: Marijo Conception M.D.   On: 04/07/2021 13:28   ECHOCARDIOGRAM COMPLETE  Result Date: 03/17/2021    ECHOCARDIOGRAM REPORT   Patient Name:   OLINE BELK Date of Exam: 03/17/2021 Medical Rec #:  [id number]    Height:       64.0 in Accession #:    [id number]   Weight:       106.9 lb Date of Birth:  1934-09-15   BSA:          1.499 m Patient Age:    74 years     BP:           133/30 mmHg Patient Gender: F            HR:           78 bpm. Exam Location:  Inpatient Procedure: 2D Echo, Color Doppler and Cardiac Doppler Indications:    I48.91* Unspecified atrial fibrillation  History:        Patient has no prior history of Echocardiogram examinations.                 Lung  Cancer.  Sonographer:    Raquel Sarna Senior RDCS Referring Phys: [place]  Sonographer Comments: Technically difficult due to small rib spacing. IMPRESSIONS  1. Left ventricular ejection fraction, by estimation, is 65 to 70%. The left ventricle has normal function. The left ventricle has no regional wall motion abnormalities. There is moderate left ventricular hypertrophy of the basal-septal segment. Left ventricular diastolic parameters are consistent with Grade I diastolic dysfunction (impaired relaxation). Elevated left ventricular end-diastolic pressure.  2.  Right ventricular systolic function is normal. The right ventricular size is normal. There is normal pulmonary artery systolic pressure.  3. The mitral valve is degenerative. Mild mitral valve regurgitation. No evidence of mitral stenosis.  4. The aortic valve is normal in structure. Aortic valve regurgitation is mild. No aortic stenosis is present.  5. The inferior vena cava is normal in size with greater than 50% respiratory variability, suggesting right atrial pressure of 3 mmHg. FINDINGS  Left Ventricle: Left ventricular ejection fraction, by estimation, is 65 to 70%. The left ventricle has normal function. The left ventricle has no regional wall motion abnormalities. The left ventricular internal cavity size was normal in size. There is  moderate left ventricular hypertrophy of the basal-septal segment. Left ventricular diastolic parameters are consistent with Grade I diastolic dysfunction (impaired relaxation). Elevated left ventricular end-diastolic pressure. Right Ventricle: The right ventricular size is normal. No increase in right ventricular wall thickness. Right ventricular systolic function is normal. There is normal pulmonary artery systolic pressure. The tricuspid regurgitant velocity is 2.71 m/s, and  with an assumed right atrial pressure of 3 mmHg, the estimated right ventricular systolic pressure is 66.0 mmHg. Left Atrium: Left atrial size was normal in size. Right Atrium: Right atrial size was normal in size. Pericardium: There is no evidence of pericardial effusion. Mitral Valve: The mitral valve is degenerative in appearance. There is mild thickening of the mitral valve leaflet(s). There is mild calcification of the mitral valve leaflet(s). Mild mitral annular calcification. Mild mitral valve regurgitation. No evidence of mitral valve stenosis. MV peak gradient, 7.3 mmHg. The mean mitral valve gradient is 4.0 mmHg. Tricuspid Valve: The tricuspid valve is normal in structure. Tricuspid valve  regurgitation is mild . No evidence of tricuspid stenosis. Aortic Valve: The aortic valve is normal in structure. Aortic valve regurgitation is mild. No aortic stenosis is present. Pulmonic Valve: The pulmonic valve was normal in structure. Pulmonic valve regurgitation is not visualized. No evidence of pulmonic stenosis. Aorta: The aortic root is normal in size and structure. Venous: The inferior vena cava is normal in size with greater than 50% respiratory variability, suggesting right atrial pressure of 3 mmHg. IAS/Shunts: No atrial level shunt detected by color flow Doppler.  LEFT VENTRICLE PLAX 2D LVIDd:         2.30 cm  Diastology LVIDs:         1.50 cm  LV e' medial:    4.68 cm/s LV PW:         1.10 cm  LV E/e' medial:  26.5 LV IVS:        1.40 cm  LV e' lateral:   5.98 cm/s LVOT diam:     1.80 cm  LV E/e' lateral: 20.7 LV SV:         79 LV SV Index:   52 LVOT Area:     2.54 cm  RIGHT VENTRICLE RV S prime:     17.70 cm/s TAPSE (M-mode): 2.3 cm LEFT ATRIUM  Index       RIGHT ATRIUM           Index LA diam:        2.50 cm 1.67 cm/m  RA Area:     11.80 cm LA Vol (A2C):   62.5 ml 41.69 ml/m RA Volume:   23.00 ml  15.34 ml/m LA Vol (A4C):   34.0 ml 22.68 ml/m LA Biplane Vol: 49.6 ml 33.08 ml/m  AORTIC VALVE LVOT Vmax:   138.00 cm/s LVOT Vmean:  96.800 cm/s LVOT VTI:    0.309 m  AORTA Ao Root diam: 3.20 cm Ao Asc diam:  3.40 cm MITRAL VALVE                TRICUSPID VALVE MV Area (PHT): 2.44 cm     TR Peak grad:   29.4 mmHg MV Area VTI:   2.28 cm     TR Vmax:        271.00 cm/s MV Peak grad:  7.3 mmHg MV Mean grad:  4.0 mmHg     SHUNTS MV Vmax:       1.35 m/s     Systemic VTI:  0.31 m MV Vmean:      95.4 cm/s    Systemic Diam: 1.80 cm MV Decel Time: 311 msec MV E velocity: 124.00 cm/s MV A velocity: 139.00 cm/s MV E/A ratio:  0.89 Fransico Him MD Electronically signed by Fransico Him MD Signature Date/Time: 03/17/2021/1:06:00 PM    Final    CT Renal Stone Study  Result Date: 03/05/2021 CLINICAL  DATA:  Flank pain in an 86 year old female for 1 week, difficulty ambulating. EXAM: CT ABDOMEN AND PELVIS WITHOUT CONTRAST TECHNIQUE: Multidetector CT imaging of the abdomen and pelvis was performed following the standard protocol without IV contrast. COMPARISON:  Chest x-ray and lumbar spine evaluation of the same date, CT of the chest, abdomen and pelvis from 2016. FINDINGS: Lower chest: Large hiatal hernia extends into the chest, greater than 50% of the stomach has herniated into the chest as on previous studies. Three-vessel coronary artery disease. No substantial pericardial effusion. Pulmonary emphysema.  No pleural effusion or consolidation. Hepatobiliary: Liver with smooth contours. No pericholecystic stranding. No gross biliary duct distension. Pancreas: Pancreatic atrophy, no signs of inflammation. Spleen: Normal. Adrenals/Urinary Tract: Post bilateral adrenalectomy. Nephrolithiasis on the LEFT, small intrarenal calculi largest in the lower pole measuring approximately 4 mm. No definite ureteral calculi, no signs of hydronephrosis or perinephric stranding. There are numerous phleboliths in the pelvis that displays similar distribution and appearance compared to prior imaging. Urinary bladder is collapsed. Stomach/Bowel: Much of the stomach again is herniated into the chest. No acute small bowel process. Fluid-filled small bowel loops of predominantly distal small bowel without substantial distension and without adjacent stranding. Moderate stool in the rectum without perirectal stranding. Colonic diverticulosis throughout the colon. No signs of colonic inflammation. Appendix not visualized, no secondary signs to suggest appendicitis. Small subcostal abdominal wall laxity contains portion of the descending colon without sign of obstruction or inflammation. Vascular/Lymphatic: Atheromatous plaque of the abdominal aorta without aneurysmal dilation. There is no gastrohepatic or hepatoduodenal ligament  lymphadenopathy. No retroperitoneal or mesenteric lymphadenopathy. No pelvic sidewall lymphadenopathy. Reproductive: Cystic lesion LEFT adnexa 3.5 x 2.5 x 3.5 cm Other: No free intra-abdominal air.  No ascites. Musculoskeletal: Osteopenia. Spinal degenerative changes. No acute bone finding or destructive bone process. T12 and L1 with loss of height, chronic and better evaluated on dedicated spinal recons, reported separately. IMPRESSION: No acute  intra-abdominal pathology.  Post bilateral adrenalectomy. Cystic lesion LEFT adnexa 3.5 x 2.5 x 3.5 cm. 3.5 cm left adnexal simple-appearing cyst, not adequately characterized. Recommend prompt follow-up with pelvic US, this could be performed on a nonemergent basis. Reference: JACR 2020 Feb;17(2):248-254 Large hiatal hernia as on previous imaging. Nephrolithiasis without signs of hydronephrosis or perinephric stranding. Three-vessel coronary artery disease. Aortic Atherosclerosis (ICD10-I70.0) and Emphysema (ICD10-J43.9). Electronically Signed   By: Zetta Bills M.D.   On: 03/05/2021 15:07   IR PERCUTANEOUS ART THROMBECTOMY/INFUSION INTRACRANIAL INC DIAG ANGIO  Result Date: 03/21/2021 INDICATION: Acute onset of right gaze deviation, non verbalization and left-sided weakness. Occluded right middle cerebral artery dominant superior division on CT angiogram of the head and neck. EXAM: 1. EMERGENT LARGE VESSEL OCCLUSION THROMBOLYSIS (anterior CIRCULATION) COMPARISON:  CT angiogram of the head and neck of 04/11/2021. MEDICATIONS: Ancef 2 g antibiotic was administered within 1 hour of the procedure. ANESTHESIA/SEDATION: General anesthesia. CONTRAST:  Omnipaque 350 approximately 80 cc. FLUOROSCOPY TIME:  Fluoroscopy Time: 55 minutes 4 seconds (379 mGy). COMPLICATIONS: None immediate. TECHNIQUE: Following a full explanation of the procedure along with the potential associated complications, an informed witnessed consent was obtained. The risks of intracranial hemorrhage of  10%, worsening neurological deficit, ventilator dependency, death and inability to revascularize were all reviewed in detail with the patient's daughter. The patient was then put under general anesthesia by the Department of Anesthesiology at [facility]. The right groin was prepped and draped in the usual sterile fashion. Thereafter using modified Seldinger technique, transfemoral access into the right common femoral artery was obtained without difficulty. Over a 0.035 inch guidewire an 8 [place] Pinnacle 25 cm sheath was inserted. Through this, and also over a 0.035 inch guidewire a 5 [place] JB 2 support catheter inside of an 087 balloon guide catheter, which had been prepped with 50% contrast and 50% heparinized saline infusion was advanced into the proximal right internal carotid artery. The exchange guidewire with support catheter was then removed. Good aspiration obtained from the hub of the balloon guide catheter with the distal end in the proximal right internal carotid artery. A gentle control arteriogram performed through the balloon guide catheter demonstrated no evidence of spasms, dissections or of intraluminal filling defects. Control arteriogram performed centered intracranially was then performed. FINDINGS: An initial innominate arteriogram demonstrates the right subclavian artery to be moderately narrowed proximally. The origin of the right vertebral artery is normal. The vessel is seen to opacify to the cranial skull base to the right posterior-inferior cerebellar artery. Distal to this the visualized right vertebrobasilar junction and proximal basilar artery as visualized is grossly patent. The right common carotid arteriogram demonstrates the right external carotid artery and its major branches to be widely patent. The right internal carotid artery at the bulb to the cranial skull base is widely patent. The petrous, the cavernous and the supraclinoid segments are widely patent. A right  posterior communicating artery is seen opacifying the right posterior cerebral artery distribution. The right anterior cerebral artery opacifies into the capillary and venous phases. The right middle cerebral artery demonstrates patency of the inferior division into the capillary and venous phases. Angiographic occlusion of a prominent superior division is noted proximally. PROCEDURE: Through the balloon guide catheter in the proximal right internal carotid artery, a combination of an 071 Zoom aspiration catheter inside of which was an 021 162 cm microcatheter was advanced over a 0.014 inch Aristotle micro guidewire with a J-tip configuration to the supraclinoid right ICA. The micro  guidewire was then advanced using a torque device to the superior division proximally and then through it to the M2 M3 region followed by the microcatheter. The guidewire was removed. Good aspiration obtained from the hub of the microcatheter. A gentle control arteriogram performed through the microcatheter demonstrated safe position of the tip of the microcatheter. This was then connected to continuous heparinized saline infusion. A 3 mm x 40 mm Solitaire X retrieval device was then advanced to the distal end of the microcatheter and deployed in the usual manner. At this time the 071 Zoom aspiration catheter was advanced just proximal to the origin of the superior division. With proximal flow arrest in the right internal carotid artery, and constant aspiration using a 20 mL syringe at the hub of the balloon guide catheter for 2-1/2 minutes, the combination of the retrieval device, the microcatheter and the 071 Zoom aspiration catheter were retrieved and removed. Following reversal of flow arrest, a control arteriogram performed through the balloon guide catheter in the right internal carotid artery now demonstrated occlusion of the main trunk of the right middle cerebral artery distally. The anterior cerebral artery circulation and the  posterior communicating artery circulation demonstrated wide patency. A second pass was now made at this time using an 055 136 cm Zoom aspiration catheter inside of which was an 021 162 microcatheter over a 0.014 inch standard Synchro micro guidewire with a J configuration. The combination was advanced to the supraclinoid right ICA. Again access through the occluded right middle cerebral artery was obtained with the micro guidewire and advanced into the M2 M3 region of the superior division. The micro guidewire was removed. Good aspiration obtained from the hub of the microcatheter. Gentle control arteriogram performed through the microcatheter demonstrated safe position of the tip of the microcatheter which was then connected to continuous heparinized saline infusion. At this time a 4 mm x 40 mm Solitaire X retrieval device was advanced to the distal end of the microcatheter and deployed in the usual manner by retrieving the microcatheter. The proximal portion of the retrieval device was just proximal to the occluded segment of the right middle cerebral artery. The 055 Zoom aspiration catheter was advanced to the origin of the superior division and connected to continuous heparinized saline infusion. With proximal flow arrest in the right internal carotid artery, constant aspiration was applied at the hub of the balloon guide catheter with a 20 mL syringe, for 3 minutes. Thereafter, a combination of the retrieval device, the microcatheter and Zoom aspiration catheter was retrieved and removed. Using reversal flow arrest, a control arteriogram performed through the balloon guide in the right internal carotid artery demonstrated a complete revascularization of the right middle cerebral artery distribution achieving a TICI 3 revascularization. Moderate spasm in the proximal MCA branch responded to 225 mcg of nitroglycerin intra-arterially. A final control arteriogram performed through the balloon guide catheter in the  proximal right internal carotid artery continued to demonstrate a complete revascularization of the right middle cerebral artery with wide patency of the right anterior and the right posterior communicating arteries. The balloon guide catheter was removed. The 8 French Pinnacle sheath was removed with successful hemostasis achieved with an 8 [place] Angio-Seal closure device. Distal pulses remained Dopplerable bilaterally unchanged. CT of the brain performed on the table demonstrated no evidence of intracranial hemorrhage or mass effect. The patient was left intubated on account of preprocedural responsiveness and to protect the airway from aspiration. Patient was then transferred to the neuro ICU  for post thrombectomy management. IMPRESSION: Status post endovascular complete revascularization of occluded right middle cerebral artery M1 segment with 1 pass with a 3 mm x 40 mm Solitaire X retrieval device and aspiration, and a 4 mm x 40 mm Solitaire X retrieval device with proximal aspiration achieving a TICI 3 revascularization. PLAN: Follow-up as per referring MD. Electronically Signed   By: Luanne Bras M.D.   On: 03/21/2021 08:44   CT HEAD CODE STROKE WO CONTRAST  Addendum Date: 04/03/2021   ADDENDUM REPORT: 04/13/2021 08:41 ADDENDUM: Correction, RIGHT MCA territory ischemia with right side ASPECTS 7. Study was discussed by telephone with Dr. Irine Seal on 03/26/2021 at 0836 hours, and also with Dr. Rory Percy via text message at [phone] hours. Electronically Signed   By: Genevie Ann M.D.   On: 04/01/2021 08:41   Result Date: 03/21/2021 CLINICAL DATA:  Code stroke.  86 year old female. EXAM: CT HEAD WITHOUT CONTRAST TECHNIQUE: Contiguous axial images were obtained from the base of the skull through the vertex without intravenous contrast. COMPARISON:  Brain MRI 12/25/2011. FINDINGS: Brain: Cerebral volume is not significantly changed since 2013. No midline shift, mass effect, or evidence of intracranial mass  lesion. Cytotoxic edema in the right frontal lobe (series 4, image 24) also involving the right operculum and insula. No acute hemorrhage identified. Normal basilar cisterns. Underlying patchy in chronic bilateral white matter hypodensity. New since 2013 but chronic appearing bilateral thalamic lacunar infarcts. No ventriculomegaly. Vascular: Mild Calcified atherosclerosis at the skull base. No suspicious intracranial vascular hyperdensity. Skull: Mild motion artifact. No acute osseous abnormality identified. Sinuses/Orbits: Visualized paranasal sinuses and mastoids are clear. Other: Rightward gaze. Postoperative changes to both globes. No acute scalp soft tissue finding. ASPECTS [facility] Stroke Program Early CT Score) Total score (0-10 with 10 being normal): 7 (abnormal right M4, M 5, and insula segments). IMPRESSION: 1. Positive for left MCA territory cytotoxic edema compatible with acute or subacute infarct. ASPECTS 7. No associated hemorrhage or mass effect. 2. Evidence of advanced underlying small vessel disease, progressed since 2013 in the bilateral thalami. Electronically Signed: By: Genevie Ann M.D. On: 04/10/2021 08:29   CT ANGIO HEAD NECK W WO CM W PERF (CODE STROKE)  Result Date: 04/05/2021 CLINICAL DATA:  86 year old female code stroke presentation with right MCA ischemia on plain head CT. History of lung cancer. EXAM: CT ANGIOGRAPHY HEAD AND NECK CT PERFUSION BRAIN TECHNIQUE: Multidetector CT imaging of the head and neck was performed using the standard protocol during bolus administration of intravenous contrast. Multiplanar CT image reconstructions and MIPs were obtained to evaluate the vascular anatomy. Carotid stenosis measurements (when applicable) are obtained utilizing NASCET criteria, using the distal internal carotid diameter as the denominator. Multiphase CT imaging of the brain was performed following IV bolus contrast injection. Subsequent parametric perfusion maps were calculated using  RAPID software. CONTRAST:  29mL OMNIPAQUE IOHEXOL 350 MG/ML SOLN COMPARISON:  Plain head CT 0821 hours today. Chest CT 05/09/2020 and earlier. FINDINGS: CT Brain Perfusion Findings: ASPECTS: 7 CBF (<30%) Volume: 56mL Perfusion (Tmax>6.0s) volume: 56mL - although some of this is in the inferior left temporal lobe and appears artifactual. Also, hypoperfusion index is 0.6, with 18 mL of T-max greater than 10. Mismatch Volume: Less than 59mL Infarction Location:Right MCA CTA NECK Skeleton: Torus palatinus, normal variant. Widespread cervical spine degeneration. No acute osseous abnormality identified. Upper chest: Small layering right pleural effusion is new from last year. Confluent right perihilar scarring and atelectasis with underlying centrilobular emphysema, previous partial left pneumonectomy.  Small but increased precarinal lymph node on series 5, image 182 measures 10 mm short axis now (previously 7 mm) and there is questionable increased right hilar soft tissue on series 5, image 186. Other neck: Small volume retained secretions in the pharynx. Otherwise negative. Aortic arch: 3 vessel arch configuration with moderate to severe calcified arch atherosclerosis. Right carotid system: Brachiocephalic artery plaque without significant stenosis. Negative right CCA origin. Calcified plaque at the right carotid bifurcation, right ICA origin with less than 50 % stenosis with respect to the distal vessel. The right ICA is mildly tortuous and patent to the skull base. Left carotid system: Mild calcified plaque at the left CCA origin and left carotid bifurcation without stenosis. Tortuous left ICA below the skull base. Vertebral arteries: Calcified plaque in the proximal right subclavian artery with less than 50% stenosis. Normal right vertebral artery origin. Tortuous right vertebral artery appears patent to the skull base without stenosis. Moderate atherosclerosis in the proximal left subclavian artery without  significant stenosis. Left vertebral artery origin is spared. Left vertebral artery is fairly codominant and tortuous, patent to the V3 segment without stenosis. There is mild irregularity in the distal V3 segment and at the skull base without significant stenosis. CTA HEAD Posterior circulation: Tortuous distal vertebral arteries, the left is mildly dominant. Patent right PICA origin. Patent vertebrobasilar junction without stenosis. Patent basilar artery without stenosis. SCA and left PCA origin are patent. Fetal type right PCA origin is patent. Bilateral PCA branches are within normal limits. Anterior circulation: Both ICA siphons are patent. Minimal ICA siphon plaque and no siphon stenosis. Early cavernous sinus enhancement. Normal right posterior communicating artery origin. Patent carotid termini. Patent MCA and ACA origins. Diminutive anterior communicating artery. Bilateral ACA branches, left MCA M1 segment, left MCA bifurcation, and left MCA branches are within normal limits. Right MCA M1 segment and trifurcation are patent but there is a right MCA M2 branch occlusion on series 9, image 37, series 12, image 9 at the middle sylvian division. Mild irregularity of the other enhancing right MCA branches. Venous sinuses: Patent. Anatomic variants: Mildly dominant left vertebral artery V4 segment. Review of the MIP images confirms the above findings IMPRESSION: 1. Positive for ELVO: Right MCA M2 branch occlusion, middle Sylvian division. 2. CT Perfusion detects a an infarct core of 15 mL which appears fairly concordant to the ASPECTS of 7. However, some of the 32 mL penumbra is artifactual (in the left temporal lobe). And Hypoperfusion Index is 0.6. 3. Right CCA and carotid bifurcation atherosclerosis without significant stenosis. Mild atherosclerosis elsewhere in the head and neck. Aortic Atherosclerosis (ICD10-I70.0). 4. Emphysema (ICD10-J43.9) and posttreatment changes to both visible lungs. Small but  increased anterior carina mediastinal lymph node since last year, with questionable increased right hilar nodal tissue raising the possibility of active right lung cancer. Small layering right pleural effusion. Salient findings were discussed with Dr. Rory Percy beginning at 0839 hours on 04/12/2021 by text messaging. Electronically Signed   By: Genevie Ann M.D.   On: 03/17/2021 09:01    Microbiology Recent Results (from the past 240 hour(s))  Blood culture (routine single)     Status: None   Collection Time: 02/23/2021  2:17 PM   Specimen: BLOOD  Result Value Ref Range Status   Specimen Description   Final    BLOOD SITE NOT SPECIFIED Performed at [facility], [address], [place], [place] [address]    Special Requests   Final    BOTTLES DRAWN  AEROBIC AND ANAEROBIC Blood Culture results may not be optimal due to an excessive volume of blood received in culture bottles Performed at [facility] [address] [place], [place] [address]    Culture   Final    NO GROWTH 5 DAYS Performed at [facility], [place] [address], [place], [place] [address]    Report Status 03/21/2021 FINAL  Final  Resp Panel by RT-PCR (Flu A&B, Covid) Nasopharyngeal Swab     Status: None   Collection Time: 03/13/2021  5:36 PM   Specimen: Nasopharyngeal Swab; Nasopharyngeal(NP) swabs in vial transport medium  Result Value Ref Range Status   SARS Coronavirus 2 by RT PCR NEGATIVE NEGATIVE Final    Comment: (NOTE) SARS-CoV-2 target nucleic acids are NOT DETECTED.  The SARS-CoV-2 RNA is generally detectable in upper respiratory specimens during the acute phase of infection. The lowest concentration of SARS-CoV-2 viral copies this assay can detect is 138 copies/mL. A negative result does not preclude SARS-Cov-2 infection and should not be used as the sole basis for treatment or other patient management decisions. A negative result may occur with  improper specimen collection/handling, submission  of specimen other than nasopharyngeal swab, presence of viral mutation(s) within the areas targeted by this assay, and inadequate number of viral copies(<138 copies/mL). A negative result must be combined with clinical observations, patient history, and epidemiological information. The expected result is Negative.  Fact Sheet for Patients:  EntrepreneurPulse.com.au  Fact Sheet for Healthcare Providers:  IncredibleEmployment.be  This test is no t yet approved or cleared by the [place] FDA and  has been authorized for detection and/or diagnosis of SARS-CoV-2 by FDA under an Emergency Use Authorization (EUA). This EUA will remain  in effect (meaning this test can be used) for the duration of the COVID-19 declaration under Section 564(b)(1) of the Act, 21 U.S.C.section 360bbb-3(b)(1), unless the authorization is terminated  or revoked sooner.       Influenza A by PCR NEGATIVE NEGATIVE Final   Influenza B by PCR NEGATIVE NEGATIVE Final    Comment: (NOTE) The Xpert Xpress SARS-CoV-2/FLU/RSV plus assay is intended as an aid in the diagnosis of influenza from Nasopharyngeal swab specimens and should not be used as a sole basis for treatment. Nasal washings and aspirates are unacceptable for Xpert Xpress SARS-CoV-2/FLU/RSV testing.  Fact Sheet for Patients: EntrepreneurPulse.com.au  Fact Sheet for Healthcare Providers: IncredibleEmployment.be  This test is not yet approved or cleared by the [place] FDA and has been authorized for detection and/or diagnosis of SARS-CoV-2 by FDA under an Emergency Use Authorization (EUA). This EUA will remain in effect (meaning this test can be used) for the duration of the COVID-19 declaration under Section 564(b)(1) of the Act, 21 U.S.C. section 360bbb-3(b)(1), unless the authorization is terminated or revoked.  Performed at [facility], [place]  [address] [place], [place] [address]   Culture, blood (routine x 2) Call MD if unable to obtain prior to antibiotics being given     Status: None   Collection Time: 02/18/2021  7:14 PM   Specimen: BLOOD  Result Value Ref Range Status   Specimen Description   Final    BLOOD RIGHT ANTECUBITAL Performed at [facility] [address], [place], [place] [address]    Special Requests   Final    BOTTLES DRAWN AEROBIC AND ANAEROBIC Blood Culture adequate volume Performed at [facility] [address] [place], [place] [address]  Culture   Final    NO GROWTH 5 DAYS Performed at [facility], [place] [address], [place], [place] [address]    Report Status 03/21/2021 FINAL  Final  Culture, blood (routine x 2) Call MD if unable to obtain prior to antibiotics being given     Status: None   Collection Time: 03/09/2021  7:16 PM   Specimen: BLOOD LEFT HAND  Result Value Ref Range Status   Specimen Description   Final    BLOOD LEFT HAND Performed at [facility] [address], [place], [place] [address]    Special Requests   Final    BOTTLES DRAWN AEROBIC AND ANAEROBIC Blood Culture adequate volume Performed at [facility] [address], [place], [place] [address]    Culture   Final    NO GROWTH 5 DAYS Performed at [facility], [place] [address], [place], [place] [address]    Report Status 03/21/2021 FINAL  Final  MRSA Next Gen by PCR, Nasal     Status: None   Collection Time: 03/03/2021  8:53 PM   Specimen: Nasal Mucosa; Nasal Swab  Result Value Ref Range Status   MRSA by PCR Next Gen NOT DETECTED NOT DETECTED Final    Comment: (NOTE) The GeneXpert MRSA Assay (FDA approved for NASAL specimens only), is one component of a comprehensive MRSA colonization surveillance program. It is not intended to diagnose MRSA infection nor to guide or monitor treatment for MRSA infections. Test performance is not FDA  approved in patients less than 64 years old. Performed at [facility], [place] [address] [place], [place] [address]   Urine Culture     Status: Abnormal   Collection Time: 03/17/21  3:01 AM   Specimen: Urine, Catheterized  Result Value Ref Range Status   Specimen Description   Final    URINE, CATHETERIZED Performed at [facility] [address], [place], [place] [address]    Special Requests   Final    NONE Performed at [facility], [place] [address] [place], [place] [address]    Culture (A)  Final    <10,000 COLONIES/mL INSIGNIFICANT GROWTH Performed at [facility] [address] [place], [place] [address]    Report Status 03/18/2021 FINAL  Final  Culture, Respiratory w Gram Stain     Status: None   Collection Time: 03/27/2021 12:14 PM   Specimen: Tracheal Aspirate; Respiratory  Result Value Ref Range Status   Specimen Description TRACHEAL ASPIRATE  Final   Special Requests NONE  Final   Gram Stain   Final    NO WBC SEEN RARE BUDDING YEAST SEEN Performed at [facility], [address], [place], [place] [address]    Culture FEW CANDIDA TROPICALIS  Final   Report Status 03/23/2021 FINAL  Final  MRSA Next Gen by PCR, Nasal     Status: None   Collection Time: 04/14/2021 12:20 PM   Specimen: Nasal Mucosa; Nasal Swab  Result Value Ref Range Status   MRSA by PCR Next Gen NOT DETECTED NOT DETECTED Final    Comment: (NOTE) The GeneXpert MRSA Assay (FDA approved for NASAL specimens only), is one component of a comprehensive MRSA colonization surveillance program. It is not intended to diagnose MRSA infection nor to guide or monitor treatment for MRSA infections. Test performance is not FDA approved in patients less than 50 years old. Performed at [facility],  [address] [place], [place] [address]   MRSA Next Gen by PCR, Nasal     Status: None   Collection Time: 03/21/21  6:39 AM   Specimen: Nasal  Mucosa; Nasal Swab  Result Value Ref Range Status   MRSA by PCR Next Gen NOT DETECTED NOT DETECTED Final    Comment: (NOTE) The GeneXpert MRSA Assay (FDA approved for NASAL specimens only), is one component of a comprehensive MRSA colonization surveillance program. It is not intended to diagnose MRSA infection nor to guide or monitor treatment for MRSA infections. Test performance is not FDA approved in patients less than 49 years old. Performed at [facility], [place] [address], [place], [place] [address]     Lab Basic Metabolic Panel: Recent Labs  Lab 04/05/2021 0456 03/21/21 0309 03/21/21 0439  NA 141 140 144  K 3.2* 3.1* 3.0*  CL 110 110  --   CO2 26 19*  --   GLUCOSE 81 139*  --   BUN 11 9  --   CREATININE 0.50 0.80  --   CALCIUM 8.2* 7.0*  --   MG  --  1.4*  --   PHOS  --  4.3  --    Liver Function Tests: No results for input(s): AST, ALT, ALKPHOS, BILITOT, PROT, ALBUMIN in the last 168 hours. No results for input(s): LIPASE, AMYLASE in the last 168 hours. No results for input(s): AMMONIA in the last 168 hours. CBC: Recent Labs  Lab 04/05/2021 0456 03/21/21 0309 03/21/21 0439  WBC 9.1 13.8*  --   NEUTROABS 6.5  --   --   HGB 10.1* 8.4* 7.8*  HCT 29.2* 25.9* 23.0*  MCV 106.6* 110.2*  --   PLT 130* 187  --    Cardiac Enzymes: No results for input(s): CKTOTAL, CKMB, CKMBINDEX, TROPONINI in the last 168 hours. Sepsis Labs: Recent Labs  Lab 03/18/2021 0456 03/21/21 0309  WBC 9.1 13.8*    Procedures/Operations   Intubation and extubation    Rosalin Hawking 03/26/2021, 10:47 AM

## 2021-04-17 DEATH — deceased

## 2021-05-18 ENCOUNTER — Ambulatory Visit: Payer: Medicare Other | Admitting: Oncology
